# Patient Record
Sex: Female | Born: 1935 | Race: White | Hispanic: No | Marital: Single
Health system: Southern US, Community
[De-identification: ages and names within clinical notes are randomized; demographics above are authoritative.]

## PROBLEM LIST (undated history)

## (undated) DIAGNOSIS — E7800 Pure hypercholesterolemia, unspecified: Secondary | ICD-10-CM

## (undated) DIAGNOSIS — K219 Gastro-esophageal reflux disease without esophagitis: Secondary | ICD-10-CM

## (undated) DIAGNOSIS — N182 Chronic kidney disease, stage 2 (mild): Secondary | ICD-10-CM

## (undated) DIAGNOSIS — M810 Age-related osteoporosis without current pathological fracture: Secondary | ICD-10-CM

## (undated) DIAGNOSIS — I340 Nonrheumatic mitral (valve) insufficiency: Secondary | ICD-10-CM

## (undated) DIAGNOSIS — I4891 Unspecified atrial fibrillation: Secondary | ICD-10-CM

## (undated) DIAGNOSIS — M1990 Unspecified osteoarthritis, unspecified site: Secondary | ICD-10-CM

## (undated) HISTORY — DX: Pure hypercholesterolemia, unspecified: E78.00

## (undated) HISTORY — DX: Unspecified osteoarthritis, unspecified site: M19.90

## (undated) HISTORY — DX: Chronic kidney disease, stage 2 (mild): N18.2

## (undated) HISTORY — DX: Age-related osteoporosis without current pathological fracture: M81.0

## (undated) HISTORY — PX: BACK SURGERY: SHX140

## (undated) HISTORY — DX: Gastro-esophageal reflux disease without esophagitis: K21.9

## (undated) HISTORY — DX: Nonrheumatic mitral (valve) insufficiency: I34.0

## (undated) HISTORY — PX: JOINT REPLACEMENT: SHX530

## (undated) HISTORY — DX: Unspecified atrial fibrillation: I48.91

---

## 2000-04-16 ENCOUNTER — Encounter: Payer: Self-pay | Admitting: Geriatric Medicine

## 2000-04-16 ENCOUNTER — Encounter: Admission: RE | Admit: 2000-04-16 | Discharge: 2000-04-16 | Payer: Self-pay | Admitting: Geriatric Medicine

## 2001-06-22 ENCOUNTER — Ambulatory Visit (HOSPITAL_COMMUNITY): Admission: RE | Admit: 2001-06-22 | Discharge: 2001-06-22 | Payer: Self-pay | Admitting: Geriatric Medicine

## 2001-06-22 ENCOUNTER — Encounter: Payer: Self-pay | Admitting: Geriatric Medicine

## 2002-07-16 ENCOUNTER — Encounter: Payer: Self-pay | Admitting: Geriatric Medicine

## 2002-07-16 ENCOUNTER — Ambulatory Visit (HOSPITAL_COMMUNITY): Admission: RE | Admit: 2002-07-16 | Discharge: 2002-07-16 | Payer: Self-pay | Admitting: Geriatric Medicine

## 2003-08-19 ENCOUNTER — Encounter: Payer: Self-pay | Admitting: Geriatric Medicine

## 2003-08-19 ENCOUNTER — Ambulatory Visit (HOSPITAL_COMMUNITY): Admission: RE | Admit: 2003-08-19 | Discharge: 2003-08-19 | Payer: Self-pay | Admitting: Geriatric Medicine

## 2004-01-11 ENCOUNTER — Encounter: Admission: RE | Admit: 2004-01-11 | Discharge: 2004-01-11 | Payer: Self-pay | Admitting: Orthopedic Surgery

## 2004-01-12 ENCOUNTER — Ambulatory Visit (HOSPITAL_COMMUNITY): Admission: RE | Admit: 2004-01-12 | Discharge: 2004-01-12 | Payer: Self-pay | Admitting: Orthopedic Surgery

## 2004-01-12 ENCOUNTER — Ambulatory Visit (HOSPITAL_BASED_OUTPATIENT_CLINIC_OR_DEPARTMENT_OTHER): Admission: RE | Admit: 2004-01-12 | Discharge: 2004-01-12 | Payer: Self-pay | Admitting: Orthopedic Surgery

## 2004-02-21 ENCOUNTER — Encounter: Admission: RE | Admit: 2004-02-21 | Discharge: 2004-02-21 | Payer: Self-pay | Admitting: Orthopedic Surgery

## 2004-09-07 ENCOUNTER — Ambulatory Visit (HOSPITAL_COMMUNITY): Admission: RE | Admit: 2004-09-07 | Discharge: 2004-09-07 | Payer: Self-pay | Admitting: Geriatric Medicine

## 2005-10-10 ENCOUNTER — Ambulatory Visit (HOSPITAL_COMMUNITY): Admission: RE | Admit: 2005-10-10 | Discharge: 2005-10-10 | Payer: Self-pay | Admitting: Geriatric Medicine

## 2006-01-06 ENCOUNTER — Encounter: Admission: RE | Admit: 2006-01-06 | Discharge: 2006-01-06 | Payer: Self-pay | Admitting: Geriatric Medicine

## 2006-02-17 ENCOUNTER — Encounter: Admission: RE | Admit: 2006-02-17 | Discharge: 2006-02-17 | Payer: Self-pay | Admitting: Orthopedic Surgery

## 2006-03-27 ENCOUNTER — Inpatient Hospital Stay (HOSPITAL_COMMUNITY): Admission: RE | Admit: 2006-03-27 | Discharge: 2006-03-31 | Payer: Self-pay | Admitting: Neurosurgery

## 2006-03-28 ENCOUNTER — Ambulatory Visit: Payer: Self-pay | Admitting: Physical Medicine & Rehabilitation

## 2006-09-10 ENCOUNTER — Inpatient Hospital Stay (HOSPITAL_COMMUNITY): Admission: RE | Admit: 2006-09-10 | Discharge: 2006-09-15 | Payer: Self-pay | Admitting: Orthopedic Surgery

## 2006-09-11 ENCOUNTER — Ambulatory Visit: Payer: Self-pay | Admitting: Physical Medicine & Rehabilitation

## 2007-10-28 ENCOUNTER — Ambulatory Visit (HOSPITAL_COMMUNITY): Admission: RE | Admit: 2007-10-28 | Discharge: 2007-10-28 | Payer: Self-pay | Admitting: Geriatric Medicine

## 2008-11-16 ENCOUNTER — Ambulatory Visit (HOSPITAL_COMMUNITY): Admission: RE | Admit: 2008-11-16 | Discharge: 2008-11-16 | Payer: Self-pay | Admitting: Geriatric Medicine

## 2009-11-22 ENCOUNTER — Ambulatory Visit (HOSPITAL_COMMUNITY): Admission: RE | Admit: 2009-11-22 | Discharge: 2009-11-22 | Payer: Self-pay | Admitting: Urology

## 2010-02-13 ENCOUNTER — Ambulatory Visit (HOSPITAL_BASED_OUTPATIENT_CLINIC_OR_DEPARTMENT_OTHER): Admission: RE | Admit: 2010-02-13 | Discharge: 2010-02-13 | Payer: Self-pay | Admitting: Orthopedic Surgery

## 2010-11-23 ENCOUNTER — Ambulatory Visit (HOSPITAL_COMMUNITY)
Admission: RE | Admit: 2010-11-23 | Discharge: 2010-11-23 | Payer: Self-pay | Source: Home / Self Care | Attending: Geriatric Medicine | Admitting: Geriatric Medicine

## 2011-01-05 ENCOUNTER — Encounter: Payer: Self-pay | Admitting: Orthopedic Surgery

## 2011-03-10 LAB — POCT HEMOGLOBIN-HEMACUE: Hemoglobin: 12.4 g/dL (ref 12.0–15.0)

## 2011-05-03 NOTE — H&P (Signed)
Jessica Berg, Jessica Berg             ACCOUNT NO.:  [id number]   MEDICAL RECORD NO.:  [id number]          PATIENT TYPE:  INP   LOCATION:  1617                         FACILITY:  [facility]   PHYSICIAN:  Ollen Gross, M.D.    DATE OF BIRTH:  12-28-1923   DATE OF ADMISSION:  09/10/2006  DATE OF DISCHARGE:  09/15/2006                                HISTORY & PHYSICAL   CHIEF COMPLAINT:  Right knee pain.   HISTORY OF PRESENT ILLNESS:  Patient is an 77 year old female who was seen  by Dr. Lequita Halt for ongoing right knee pain.  She has been worked up in the  past for back and right leg pain.  She is known to have L4-5 disc herniation  with some degenerative disc disease, spondylolisthesis but she has also been  seen and evaluated by Dr. Lequita Halt and found to have significant arthritis in  and about the right knee.  The knee has been progressive in nature and felt  that a significant amount of pain has been coming from the joint.  It is  felt she has reached a point where she could benefit from undergoing knee  replacement.  Risks and benefits have been discussed and she elects to  proceed with surgery.   ALLERGIES:  HYDROCODONE, ULTRACET, DARVOCET, PERCOCET, MEPROZINE.   CURRENT MEDICATIONS:  Celebrex, vitamin C and calcium.   PAST MEDICAL HISTORY:  1. History of pernicious anemia.  2. History of phlebitis following knee scope.  3. Osteoporosis.  4. Osteoarthritis.  5. Lumbar degenerative disc disease with L4-5 spondylolisthesis.   PAST SURGICAL HISTORY:  1. Appendectomy.  2. Hysterectomy.  3. Back surgery.  4. Right knee arthroscopy.  5. Cataract surgery.  6. Eye lid procedure.   SOCIAL HISTORY:  Widow, retired, nonsmoker, no alcohol, 6 children.   FAMILY HISTORY:  Both parents are deceased.  Both parents had arthritis.  Daughter and son both with diabetes.  Father with a history of myocardial  infarction. Mother with history of hypothyroidism and heart disease.   REVIEW OF  SYSTEMS:  GENERAL:  No fevers, chills or night sweats.  NEUROLOGIC:  No seizures, syncope or paralysis.  RESPIRATORY:  No shortness  of breath, productive cough or hemoptysis.  CARDIOVASCULAR:  No chest pain,  angina or orthopnea.  GI:  No nausea, vomiting, diarrhea or constipation.  GU:  No dysuria, hematuria or discharge.  MUSCULOSKELETAL:  Right knee.   PHYSICAL EXAMINATION:  GENERAL APPEARANCE:  An 78 year old white female,  well-developed, well-nourished, in no acute distress.  She is alert,  oriented, cooperative and pleasant accompanied by her sister.  Excellent  historian.  VITAL SIGNS:  Pulse 72, respirations 12, blood pressure 140/64.  HEENT:  Normocephalic and atraumatic.  Pupils are equal, round and reactive  to light.  Oropharynx clear.  EOM intact.  She does have partial upper and  lower dentures.  NECK:  Supple.  No carotid bruits.  CHEST:  Clear anterior and posterior, chest wall normal.  No wheezing,  rhonchi or rales.  CARDIOVASCULAR:  Regular rate and rhythm with no murmur.  S1 and S2 noted.  ABDOMEN:  Soft, nontender.  Bowel sounds present.  RECTAL/BREASTS/GENITALIA:  Not done and not pertinent to present illness.  EXTREMITIES:  Right knee motor function is intact throughout the right lower  extremity.  Sensation is intact.  No instability.  Joint line tenderness.   IMPRESSION:  Osteoarthritis right knee.   PLAN:  Patient admitted to [facility] [place] to undergo right total knee  arthroplasty.  Procedure will be performed by Dr. Ollen Gross.      Alexzandrew L. Julien Girt, P.A.      Ollen Gross, M.D.  Electronically Signed    ALP/MEDQ  D:  09/14/2006  T:  09/15/2006  Job:  [id number]   cc:   Hal T. Stoneking, M.D.  Fax: [phone]

## 2011-05-03 NOTE — Op Note (Signed)
NAME:  Jessica Berg, Jessica Berg                       ACCOUNT NO.:  [id number]   MEDICAL RECORD NO.:  [id number]                   PATIENT TYPE:  AMB   LOCATION:  DSC                                  FACILITY:  [facility]   PHYSICIAN:  Loreta Ave, M.D.              DATE OF BIRTH:  [birth year]   DATE OF PROCEDURE:  01/12/2004  DATE OF DISCHARGE:                                 OPERATIVE REPORT   PREOPERATIVE DIAGNOSIS:  Chondrocalcinosis and medial meniscus tear, right  knee.   POSTOPERATIVE DIAGNOSIS:  Chondrocalcinosis and medial and lateral meniscus  tears as well as some mild diffuse grade 3 chondromalacia, all three  compartments, most marked at the patellofemoral joint, right knee.   PROCEDURE:  Right knee exam under anesthesia, arthroscopy, chondroplasty of  the patellofemoral joint as well as medial and lateral compartments,  extensive partial medial and lesser extensive partial lateral meniscectomy.   SURGEON:  Loreta Ave, M.D.   ASSISTANT:  Arlys John D. Petrarca, P.A.-C.   ANESTHESIA:  Knee block with sedation.   SPECIMENS:  None.   CULTURES:  None.   COMPLICATIONS:  None.   DRESSINGS:  Soft compressive.   PROCEDURE:  The patient was brought to the operating room and placed on the  operating table in supine position.  After adequate anesthesia had been  obtained, the right knee was examined.  Full motion, good stability.  Markedly positive McMurray's medially.  Tourniquet and leg holder applied.  The leg was prepped and draped in the usual sterile fashion.  Three portals  were created, one superolateral and one medial and lateral parapatellar.  Inflow catheter introduced, arthroscope introduced, knee inspected.  Grade 2  and 3 changes of all three compartments.  Chondroplasty throughout down to a  stable surface.  Remainder articular cartilage throughout.  Most marked  patellofemoral joint.  Marked chondrocalcinosis within the meniscus.  Lateral meniscus had frayed  tearing centrally all the way around, this was  debrided to a stable surface leaving a reasonable amount of meniscus at  completion.  Medial compartment had marked displaced complex tearing  posterior half medial meniscus.  The entire posterior half was removed.  Nice stable surface at completion.  All areas examined, all loose fragments  were removed.  All uneven cartilage surfaces debrided.  The instruments and  fluid were removed.  The portals of the knee were injected with Marcaine.  The portals were closed with 4-0 nylon.  Sterile compressive dressing  applied.  Anesthesia reversed.  Brought to the recovery room.  Tolerated the  surgery well without complications.                                               Loreta Ave, M.D.   DFM/MEDQ  D:  01/12/2004  T:  01/12/2004  Job:  [id number]

## 2011-05-03 NOTE — H&P (Signed)
NAMEKIANDRIA, CLUM             ACCOUNT NO.:  [id number]   MEDICAL RECORD NO.:  [id number]          PATIENT TYPE:  INP   LOCATION:  3172                         FACILITY:  [facility]   PHYSICIAN:  Hewitt Shorts, M.D.DATE OF BIRTH:  12/11/1924   DATE OF ADMISSION:  03/27/2006  DATE OF DISCHARGE:                                HISTORY & PHYSICAL   HISTORY OF PRESENT ILLNESS:  Patient is a 77 year old right-handed white  female who is seen in neurosurgery consultation regarding an acute right L4-  5 lumbar disk herniation.  Patient has had a number of difficulties with her  right knee having undergone a number of surgeries.  Is considered for right  knee replacement.  However, she fell going into [employer] on February 12, 2006.  She does not feel that the fall was related to her knee problems but since  then she has had low back pain radiating to the right buttock and posterior  thigh that has been incapacitating.  She describes numbness and tingling in  the right foot since the fall of 2006.  She does not describe any specific  weakness but her right lower extremity can giveaway due to pain.   The patient is seen in neurosurgery consultation.  MRI scan revealed a  degenerative spondylolisthesis at L4 and 5 with a large right L4-5 lumbar  disk herniation with fragment that had migrated rostrally behind the body of  L4.  X-rays showed a dynamic degenerative spondylolisthesis L4 and 5 and a  decision made to proceed with diskectomy and arthrodesis.   PAST MEDICAL HISTORY:  She does not describe any history of hypertension,  myocardial infarction, cancer, stroke, diabetes, peptic ulcer disease, or  lung disease.   PAST SURGICAL HISTORY:  1.  Appendectomy 1942.  2.  Hysterectomy 1970.  3.  Cataract surgery 1973.  4.  Right knee arthroscopy 2005.   She reports allergy to LODINE, but also describes that DEMEROL and VICODIN  can cause nausea and vomiting.   CURRENT MEDICATIONS:   Celebrex 200 mg daily.   FAMILY HISTORY:  Her mother died age 46.  She had hypothyroidism and heart  disease.  Father died at age 60.  He had myocardial infarction.   SOCIAL HISTORY:  Patient is widowed.  She is retired.  She has a number of  children, two daughters locally.  Both have medical problems.  Patient does  not smoke, drink alcoholic beverage, or have a history of substance abuse.   REVIEW OF SYSTEMS:  Notable for those described in the history of present  illness, past medical history.  14-point review of systems sheet is  otherwise unremarkable.   PHYSICAL EXAMINATION:  GENERAL:  Patient is a well-developed, well-nourished  white female in no acute distress.  VITAL SIGNS:  Temperature 97, pulse 69, blood pressure 141/76, respiratory  rate 16, height 5 feet 1 inches, weight 154 pounds.  LUNGS:  Clear to auscultation.  She has symmetrical respiratory excursion.  HEART:  Regular rate and rhythm without S1, S2.  There is no murmur.  ABDOMEN:  Soft, nondistended.  Bowel sounds are present.  EXTREMITIES:  Shows no clubbing, cyanosis, edema.  MUSCULOSKELETAL:  Shows a negative straight leg raising bilaterally.  There  is mild discomfort to palpation diffusely in the lumbar region, particularly  to the right side.  She is able to flex to 90 degrees without pain; however,  she has pain with any amount of extension.  NEUROLOGIC:  5/5 strength to the upper and lower extremities including the  deltoid, biceps, triceps, intrinsics, grip, iliopsoas, quadriceps,  dorsiflexor, extensor hallux longus, and plantar flexor bilaterally.  She  was hesitant to use full effort with the proximal right lower extremity due  to pain.  Sensation is intact to pin prick to the upper and lower  extremities.  Reflexes are 1 in the biceps, brachioradialis, triceps,  quadriceps, and gastrocnemii.  They are symmetrical bilaterally.  Toes are  downgoing bilaterally.  Gait and stance both favor right lower  extremity.   IMPRESSION:  Acute right lumbar radiculopathy secondary to a large right L4-  5 lumbar disk herniation with a fragment that has migrated rostrally behind  the body of L4.  She has dynamic degenerative spondylolisthesis L4 and 5  with underlying spondylosis and degenerative disk disease.   PLAN:  Patient will be admitted for bilateral L4-5 lumbar decompression,  diskectomy, posterior lumbar interbody fusion with interbody implants and  bone graft and posterolateral arthrodesis with posterior instrumentation and  bone graft.  We have discussed the nature of her condition, alternatives to  surgery, the nature of the surgical procedure itself, typical length of  surgery, hospital stay and overall recuperation, limitations  postoperatively, need for postoperative mobilization in a lumbar corset, and  risks are to include risk of infection, bleeding, possible need for  transfusion, the risk of nerve root dysfunction, pain, weakness, numbness,  or paraesthesias, the risk of dural tear and CSF leakage and possible need  for further surgery, the risk of failure of the arthrodesis and anesthetic  risks of myocardial infarction, stroke, pneumonia, and death.  It is  explained that we would use a cell saver during surgery.  Understanding all  of this, patient would like to go ahead with surgery and is admitted for  such.      Hewitt Shorts, M.D.  Electronically Signed     RWN/MEDQ  D:  03/27/2006  T:  03/27/2006  Job:  [id number]

## 2011-05-03 NOTE — Op Note (Signed)
Jessica Berg, Jessica Berg             ACCOUNT NO.:  [id number]   MEDICAL RECORD NO.:  [id number]          PATIENT TYPE:  INP   LOCATION:  3011                         FACILITY:  [facility]   PHYSICIAN:  Hewitt Shorts, M.D.DATE OF BIRTH:  1924-01-14   DATE OF PROCEDURE:  03/27/2006  DATE OF DISCHARGE:                                 OPERATIVE REPORT   PREOPERATIVE DIAGNOSES:  1.  Right L4-5 disk herniation.  2.  L4-5 dynamic degenerative spondylolisthesis, grade 1-2.  3.  Lumbar degenerative disease and lumbar radiculopathy.   POSTOPERATIVE DIAGNOSES:  1.  Right L4-5 disk herniation.  2.  L4-5 dynamic degenerative spondylolisthesis, grade 1-2.  3.  Lumbar degenerative disease and lumbar radiculopathy.   PROCEDURES:  1.  Bilateral L4-5 lumbar laminotomy, facetectomy and microdiskectomy.  2.  Posterior lumbar interbody fusion, with AVS Peak interbody implants and      VTOSS with bone marrow aspirate.  3.  Posterolateral arthrodesis with 90-deep posterior instrumentation, VTOSS      with bone marrow aspirate and InterFuse.   SURGEON:  Hewitt Shorts, M.D.   ASSISTANT:  Cristi Loron, M.D.   ANESTHESIA:  General endotracheal.   INDICATIONS:  The patient is a 77 year old woman who presented with acute  right lumbar radiculopathy and a large right L4-5 lumbar disk herniation,  that had migrated rostrally behind the body of L4.  Workup revealed dynamic  degenerate spondylolisthesis at L4 and L5.  She is now ready to proceed with  decompression and arthrodesis.   PROCEDURE:  The patient was brought to the operating room, placed under  general endotracheal anesthesia.  The patient was turned to a prone position  and the lumbar region was prepped Betadine soapy solution, and then draped  in a sterile fashion.  The midline was infiltrated with local anesthetic  with epinephrine, and then a midline incision was made after an x-ray was  taken for localization.  Dissection was  carried down through the  subcutaneous tissue.  Bipolar and electrocautery were used to maintain  hemostasis.  Dissection was carried down through to the lumbar fascia, which  was incised bilaterally and the paraspinal muscles were dissected in the  spinous processes of the lamina in a subperiosteal fashion.  Additional x-  rays were taken for localization, and the L4-5 interlaminar space was  identified. Dissection was then carried out laterally over the hypertrophic  facet complexes, and we exposed the transverse processes at L4 and L5  bilaterally.  We then removed the hypertrophic facet overgrowth with double-  action rongeur, and then laminectomy and facetectomy was performed using the  Extra Max drill and Kerrison punches.  The microscope was draped and brought  into the field to provide additional magnification and illumination for  visualization.  The remainder of the decompression was performed using  microdissection and microsurgical technique.  The ligament of Flavum was  thickened bilaterally, and was carefully removed -- exposing the thecal sac.  We exposed the L4 and L5 nerve roots bilaterally.  We then jointly retracted  the thecal sac on the right side medially, and incised the annulus  and the  posterior and longitudinal ligaments.  Several fragments of disk herniation  were immediately removed, and then the disk space was entered and we  proceeded with diskectomy using a variety of microcurets and pituitary  rongeurs.  We then turned our attention to the ventral epidural space,  rostral to the L4-5 disk level, ventral to the thecal sac and to the right  L4 nerve root.  We were able to mobilize several large fragments of disk  herniation in the epidural space, and we were able to decompress the thecal  sac and right L4 nerve root.   We then returned to the disk space and proceeded with preparation,  continuing the diskectomy and removing posterior spondylytic overgrowth.   Then using paddle curets, we removed the cartilaginous endplates.  We then  exposed the annulus on the left side, incised the annulus, entered the disk  space and continued the diskectomy using microcurets and pituitary rongeurs.  The spondylitic overgrowth in the posterior aspect of the vertebral bodies  was removed as well; and, the endplates were then prepared with paddle  curets.  We selected 9 mm in height, 25 mm in depth Peak anterior interbody  implants, with 0 degree of lordosis.  We then brought in the C-arm  fluoroscope and proceeded with probing the right L4 pedicle.  With C-arm  fluoroscopic guidance we aspirated bone marrow aspirate that was injected  over a 10 cc strip of VTOSS. We then packed the interbody implants with  VTOSS with bone marrow aspirate.  Carefully retracting the thecal sac and  nerve root, a spacer was placed in the left side.  We then retracted the  thecal sac and nerve root on the right side; placed the interbody implant  and it was countersunk. We then removed the spacer from the left side and  retracted the thecal sac and nerve roots medially; packing the space between  the implants with VTOSS with bone marrow aspirate and then placing the left-  sided implant.  It was again countersunk.   We then went ahead with C-arm fluoroscopic guidance and probed the left L4  pedicle as well as the L5 pedicles bilaterally.  Each of them were examined  with a ball probe.  No cutouts were found.  Each was tapped with a 5.25 mm  tap, and we placed 5.75 x 40 mm screws bilaterally at L4 and L5.   We then cut a 60 mm rod in half.  It was plated to the screw heads and  locking caps were placed, and then tightened against counter torque.  The  spinous processes were decorticated and then we packed InFuse VTOSS and  additional InFuse in the lateral gutters over the L4 and L5 transverse  processes, and then into the intertransverse space.  The wound was irrigated numerous  times through the procedure with Bacitracin  solution.  Then, once the arthrodesis was completed, we proceeded with  closure.  The deep fascia was closed with interrupted undyed 1-0 Vicryl  suture.  The Scarpa's fascia was closed with interrupted undyed 1-0 Vicryl  sutures.  The subcutaneous with subcuticular interrupted 2-0 Vicryl sutures.  The skin was closed with surgical staples.  The wound was dressed with  Adaptic and sterile gauze.   The procedure was tolerated well.  Following the procedure the patient was  to be turned back to the supine position; to be reversed from the anesthetic  and extubated, then transferred to the recovery room for further  care.   ESTIMATED BLOOD LOSS:  450 cc.  The patient was given about 200 cc of self-  saver blood.   COUNTS:  Sponge and needle count were correct.     Hewitt Shorts, M.D.  Electronically Signed    RWN/MEDQ  D:  03/27/2006  T:  03/27/2006  Job:  [id number]

## 2011-05-03 NOTE — Discharge Summary (Signed)
Jessica Berg, FRIEDLY             ACCOUNT NO.:  [id number]   MEDICAL RECORD NO.:  [id number]          PATIENT TYPE:  INP   LOCATION:  3011                         FACILITY:  [facility]   PHYSICIAN:  Hewitt Shorts, M.D.DATE OF BIRTH:  1924-05-18   DATE OF ADMISSION:  03/27/2006  DATE OF DISCHARGE:  03/31/2006                           DISCHARGE SUMMARY - REFERRING   ADMISSION HISTORY AND PHYSICAL:  Patient is a 77 year old woman who was seen  regarding an acute right L4-5 lumbar disc herniation.  She was found to have  a degenerative grade I to II spondylolisthesis at L4 and 5 with a large  right L4-5 lumbar disc herniation with a fragment that had migrated  rostrally behind the body of L4.  Patient was admitted for discectomy and  arthrodesis.   Her history is also notable for having undergone a number of right knee  surgery, Dr. Lequita Halt feels that she is going to need a total right knee  replacement, but because of this severe acute radiculopathy due to acute  disc herniation (that was related to a fall at [facility] on February 12, 2006),  she is now admitted for lumbar decompression and arthrodesis.   PAST MEDICAL HISTORY:  She did not describe any history of hypertension,  myocardial infarction, cancer, stroke, diabetes, peptic ulcer disease, or  lung disease.   PAST SURGICAL HISTORY:  1.  Appendectomy.  2.  Hysterectomy.  3.  Cataract surgery.  4.  Right knee arthroscopy.   OUTPATIENT MEDICATION:  Celebrex 10 mg daily.   PHYSICAL EXAMINATION:  Unremarkable general examination.  Neurologic  examination showed 5/5 strength.  She had a negative straight leg raising.   HOSPITAL COURSE:  Patient was admitted, underwent a bilateral L4-5 lumbar  laminotomy, facetectomy and microdiscectomy, posterior lumbar interbody  fusion with interbody implants and bone graft and a L4-5 posterolateral  arthrodesis with 90D posterior instrumentation and bone graft.   Following surgery, the  patient has done well.  She was seen in consultation  by physical therapy and occupational therapy.  She has made gradual progress  in terms of transfers and mobility.  She is ambulating with assistance with  a rolling walker.  However, because of her ongoing knee difficulties, she  has some limitations in ambulation.  Physical medicine rehabilitation  consultation was obtained, however, after the patient spoke with the rehab  doctor, she requested placement in a skilled nursing facility.  Patient has  been afebrile.  Postoperative chemistries and blood counts have been stable.  Patient's wound has been healing well and staples removed on the day of  transfer and the wound was Steri-Stripped.  She should leave it open to air,  however, when she showers only for the first three or four days, she should  have it covered.  After the first three or four days, she can shower without  covering the wound and the Steri-Strips are washed off.   We expect the patient to continue with her rehabilitation at the [facility] where arrangements have been made for transfer.  The patient  will be followed by her primary physician  Hal T. Stoneking, M.D., who covers  at the [facility] and will be able to attend to medical issues  if they should arise.  Patient should return to my office for follow-up in  three to four weeks and we will perform AP and lateral lumbar spine x-rays  at my office on the day of her visit to assess her fusion.   As far as medications are concerned, she has been using Percocet p.r.n. for  pain, but otherwise has not required much in the way of medications.   DISCHARGE DIAGNOSES:  1.  Right L4-5 lumbar disc herniation.  2.  L4-5 dynamic degenerative spondylolisthesis grade I to II.  3.  Lumbar degenerative disc disease.  4.  Lumbar spondylolisthesis.  5.  Lumbar radiculopathy.  6.  Advanced arthritic degenerative changes of the right knee.      Hewitt Shorts, M.D.  Electronically Signed     RWN/MEDQ  D:  03/31/2006  T:  03/31/2006  Job:  [id number]

## 2011-05-03 NOTE — Op Note (Signed)
NAMECYNDIE, WOODBECK             ACCOUNT NO.:  [id number]   MEDICAL RECORD NO.:  [id number]          PATIENT TYPE:  INP   LOCATION:  1617                         FACILITY:  [facility]   PHYSICIAN:  Ollen Gross, M.D.    DATE OF BIRTH:  September 19, 1924   DATE OF PROCEDURE:  09/10/2006  DATE OF DISCHARGE:                                 OPERATIVE REPORT   PREOPERATIVE DIAGNOSIS:  Osteoarthritis right knee.   POSTOPERATIVE DIAGNOSIS:  Osteoarthritis right knee.   PROCEDURE:  Right total knee arthroplasty.   SURGEON:  Ollen Gross, M.D.   ASSISTANT:  Alexzandrew L. Julien Girt, P.A.   ANESTHESIA:  General with postop Marcaine pain pump.   ESTIMATED BLOOD LOSS:  Minimal   DRAIN:  Hemovac x1.   TOURNIQUET TIME:  46 minutes at 300 mmHg.   COMPLICATIONS:  None.   CONDITION:  Stable to recovery.   BRIEF CLINICAL NOTE:  Ms. Denzler is an 77 year old female who has end-stage  arthritis of the right knee with intractable pain.  She presents for total  knee arthroplasty.   PROCEDURE IN DETAIL:  After successful administration of general anesthetic,  tourniquet is placed on the right thigh and right lower extremity prepped  and draped in usual sterile fashion.  Extremity is wrapped in Esmarch, knee  flexed, tourniquet inflated to 300 mmHg.  Midline incision made with 10  blade through subcutaneous tissue to the level of the extensor mechanism.  Fresh blade is used to make a medial parapatellar arthrotomy.  Soft tissue  of the proximal medial tibia is subperiosteally elevated to the joint line  with the knife into the semimembranosus bursa with a Cobb elevator.  Soft  tissue laterally is elevated with attention being paid to avoid the patellar  tendon on tibial tubercle.  Patella subluxed laterally, knee flexed 90  degrees, ACL and PCL removed.  Drill was used to create a starting hole and  the distal femur canal is thoroughly irrigated.  A 5 degree right valgus  alignment guide is placed and  referencing off the posterior condyles,  rotations marked and the block pinned to remove 10 mm of the distal femur.  Distal femoral resection is made with an oscillating saw.  Sizing blocks  placed, size 3 is most appropriate.  Rotations marked off the epicondylar  axis and a size 3 cutting block placed.  The anterior, posterior and chamfer  cuts are made.   Tibia is subluxed forward and the menisci are removed.  Extramedullary  tibial alignment guide is placed referencing proximally at the medial aspect  of the tibial tubercle and distally along the second metatarsal axis and  tibial crest.  Blocks pinned to remove 10 mm of the nondeficient lateral  side.  Tibial resection is made with an oscillating saw.  Size 3 is the most  appropriate tibial component and the proximal tibia prepared the modular  drill and keel punch for size 3.  Femoral preparation is completed with the  intercondylar cut.   Size 3 mobile bearing tibial trial, size 3 posterior stabilized femoral  trial and 10 mm posterior stabilized rotating platform  insert trial are  placed.  With the 10, full extension is achieved with excellent varus and  valgus balance throughout full range of motion.  The patella is then everted  and thickness measured to be 22 mm.  Freehand resection taken to 13 mm, 35  template is placed, lug holes are drilled, trial patella is placed and it  tracks normally.  The osteophytes removed off the posterior femur with the  trial in place.  All trials are removed and cut bone surfaces prepared with  pulsatile lavage.  Cement is mixed and once ready for implantation, a size 3  mobile bearing tibial, tray size 3 posterior stabilized femur and 35 patella  are cemented into place and patella is held with the clamp.  Trial 10 mm  insert is placed and knee held in full extension and all extruded cement  removed.  Once cement is fully hardened, then the permanent 10 mm posterior  stabilized rotating  platform insert is placed into the tibial tray.  Wound  is copiously irrigated with saline solution and the extensor mechanism  closed over Hemovac drain with interrupted #1 PDS.  Flexion against gravity  is 135 degrees.  Tourniquet is released with total time of 46 minutes.  Subcu is closed with interrupted 2-0 Vicryl, subcuticular running 4-0  Monocryl.  The catheter for the Marcaine pain pump is placed and the pump is  initiated.  Drain is hooked to suction.  Steri-Strips and bulky sterile  dressing are applied and then she is placed into a knee immobilizer,  awakened and transported to recovery in stable condition.      Ollen Gross, M.D.  Electronically Signed     FA/MEDQ  D:  09/10/2006  T:  09/12/2006  Job:  [id number]

## 2011-05-03 NOTE — Discharge Summary (Signed)
Jessica Berg, Jessica Berg             ACCOUNT NO.:  [id number]   MEDICAL RECORD NO.:  [id number]          PATIENT TYPE:  INP   LOCATION:  1617                         FACILITY:  [facility]   PHYSICIAN:  Ollen Gross, M.D.    DATE OF BIRTH:  Aug 09, 1924   DATE OF ADMISSION:  09/10/2006  DATE OF DISCHARGE:  09/15/2006                                 DISCHARGE SUMMARY   ADMITTING DIAGNOSES:  1. Osteoarthritis, right knee.  2. Pernicious anemia.  3. Osteoporosis.  4. Osteoarthritis.  5. History of phlebitis after right knee scope.  6. Lumbar degenerative disc disease.  7. L4-L5 spondylolisthesis and spinal stenosis.   DISCHARGE DIAGNOSES:  1. Osteoarthritis, right knee, status post right total knee arthroplasty.  2. Postoperative acute blood loss anemia.  3. Pernicious anemia.  4. Osteoporosis.  5. Osteoarthritis.  6. History of phlebitis after right knee scope.  7. Lumbar degenerative disc disease.  8. L4-L5 spondylolisthesis and spinal stenosis.   PROCEDURE:  On September 10, 2006, right total knee, Dr. Lequita Halt.  Assistant  Julien Girt, PA-C.   CONSULTATIONS:  Rehab services.   BRIEF HISTORY:  Ms. Courtney is an 77 year old female with end-stage  osteoarthritis of the right knee with intractable pain, now presents for  total knee arthroplasty.   LABORATORY DATA:  Preoperative CBC hemoglobin 13.6, hematocrit of 39.4,  white cell count 6.9.  Postop hemoglobin 10.2, drifted down to 9.2, starting  coming back up and was 9.4 with a hematocrit of 26.9 prior to discharge.  PT, PTT on admission 13.8 and 29, respectively, INR 1.  Serial pro times  followed and last PT and INR 16 and 1.2.  Chem panel on admission all within  normal limits.  Serial BMETs were followed.  Glucose 199-198, back down to  122.  Remaining electrolytes remained within normal limits.  Preop UA mild  leukocyte esterase with negative nitrite, 0-2 white cells, otherwise  negative.  Blood group type B+.   Two view chest  March 25, 2006 no active disease.   EKG March 24, 2006 sinus rhythm with marked sinus arrhythmia, otherwise  normal per Dr. Lenise Herald.   HOSPITAL COURSE:  The patient was admitted to [facility],  tolerated the procedure well, later transferred to the recovery room and the  orthopedic floor.  Started on PCA and p.o. analgesia for pain control  following surgery.  Given 24 hours of postop IV antibiotics.  Started on  Coumadin for DVT prophylaxis.  Did have fair amount of nausea on the night  of surgery and the morning of day 1.  Used antiemetics.  Rehab consult was  called to see if the patient would need any type of inpatient stay.  The  patient wanted to look into rehab or look into [facility] where she had  previously been seen and treated before.  The patient was seen by rehab  services and felt due to the initial slow progression that she would require  some type of skilled nursing facility.  We asked discharge planning to look  into [facility] as an option.  By day 2, her hemoglobin had dropped  a  little bit but she was asymptomatic with it.  She was placed on iron.  Dressing was changed.  Incision looked good.  She was slowly progressing  with her therapy at that point, only getting up and walking short distances  of about 20-40 feet.  She was continuing to progress.  PCA was discontinued  at that time.  She still had a little bit of nausea so we continued the  fluids a little bit longer.  Over the weekend, she did fairly well,  continued to progress with PT.  Wound was checked and followed closely.  Arrangements were being made and it was felt that she would be ready to go  on Monday, October 1.  She was seen in rounds by Dr. Lequita Halt on October 1,  doing well.  Incision looks good.  She was not quite therapeutic yet on her  INR.  Therefore, Lovenox was added.  At that point, she was discharged to  [facility] for continued care.   DISCHARGE PLAN:  Patient  discharged over to [facility].   DISCHARGE DIAGNOSES:  Please see above.   DISCHARGE MEDICATIONS:  Current medications include:  1. Coumadin protocol, please titrate the Coumadin levels for a target INR      between 2-3.  2. Colace 100 mg p.o. b.i.d.  3. Robaxin 500 mg p.o. q.6-8 h p.r.n. spasm.  4. Benadryl 25 mg p.o. at bedtime p.r.n. sleep.  5. Zofran 4 mg p.o. q.6 h p.r.n. nausea.  6. Dilaudid 2 mg 1-2 tabs every 4-6 hours as needed for pain.  7. Please also add Lovenox 40 subcutaneous injection daily.  Please      continue the Lovenox until her INR is 2.  Once her INR reaches 2, you      may please discontinue the Lovenox.   DIET:  Diet as tolerated.   ACTIVITY:  She is weightbearing as tolerated to the right lower extremity.  Continue gait training, ambulation, ADLs as per PT and OT.  Total knee  protocol.  Daily dressing changes.  She may start showering when she is  transferred.   FOLLOWUP:  She is to follow up with Dr. Lequita Halt in the office 2 weeks from  surgery.   DISPOSITION:  [facility].   CONDITION UPON DISCHARGE:  Improving.      Alexzandrew L. Julien Girt, P.A.      Ollen Gross, M.D.  Electronically Signed    ALP/MEDQ  D:  09/15/2006  T:  09/15/2006  Job:  [id number]   cc:   Hal T. Stoneking, M.D.  Fax: [phone]

## 2011-11-23 ENCOUNTER — Emergency Department (HOSPITAL_COMMUNITY)
Admission: EM | Admit: 2011-11-23 | Discharge: 2011-11-23 | Disposition: A | Discharge status: Home / Self Care | Payer: Medicare Other | Attending: Emergency Medicine | Admitting: Emergency Medicine

## 2011-11-23 ENCOUNTER — Other Ambulatory Visit: Payer: Self-pay

## 2011-11-23 ENCOUNTER — Emergency Department (HOSPITAL_COMMUNITY): Payer: Medicare Other

## 2011-11-23 DIAGNOSIS — R5381 Other malaise: Secondary | ICD-10-CM | POA: Insufficient documentation

## 2011-11-23 DIAGNOSIS — R5383 Other fatigue: Secondary | ICD-10-CM | POA: Insufficient documentation

## 2011-11-23 DIAGNOSIS — N390 Urinary tract infection, site not specified: Secondary | ICD-10-CM | POA: Insufficient documentation

## 2011-11-23 DIAGNOSIS — R531 Weakness: Secondary | ICD-10-CM

## 2011-11-23 DIAGNOSIS — J069 Acute upper respiratory infection, unspecified: Secondary | ICD-10-CM | POA: Insufficient documentation

## 2011-11-23 LAB — TROPONIN I: Troponin I: <0.3 ng/mL (ref <0.30)

## 2011-11-23 LAB — CK TOTAL AND CKMB (NOT AT ARMC)
Relative Index: INVALID (ref 0.0–2.5)
Total CK: 80 U/L (ref 7–177)

## 2011-11-23 LAB — URINALYSIS, ROUTINE W REFLEX MICROSCOPIC
Glucose, UA: NEGATIVE mg/dL
Hgb urine dipstick: NEGATIVE
Ketones, ur: NEGATIVE mg/dL
Protein, ur: NEGATIVE mg/dL
pH: 6.5 (ref 5.0–8.0)

## 2011-11-23 LAB — DIFFERENTIAL
Eosinophils Relative: 2 % (ref 0–5)
Lymphocytes Relative: 29 % (ref 12–46)
Lymphs Abs: 1.9 10*3/uL (ref 0.7–4.0)
Monocytes Absolute: 0.7 10*3/uL (ref 0.1–1.0)
Neutro Abs: 3.8 10*3/uL (ref 1.7–7.7)
Neutrophils Relative %: 58 % (ref 43–77)

## 2011-11-23 LAB — COMPREHENSIVE METABOLIC PANEL
ALT: 19 U/L (ref 0–35)
AST: 23 U/L (ref 0–37)
Alkaline Phosphatase: 78 U/L (ref 39–117)
BUN: 15 mg/dL (ref 6–23)
Calcium: 10.2 mg/dL (ref 8.4–10.5)
Creatinine, Ser: 0.92 mg/dL (ref 0.50–1.10)

## 2011-11-23 LAB — CBC
MCHC: 33.2 g/dL (ref 30.0–36.0)
Platelets: 192 10*3/uL (ref 150–400)
RDW: 11.8 % (ref 11.5–15.5)

## 2011-11-23 LAB — URINE MICROSCOPIC-ADD ON

## 2011-11-23 MED ORDER — SULFAMETHOXAZOLE-TRIMETHOPRIM 800-160 MG PO TABS: 1.0000 | ORAL_TABLET | Freq: Two times a day (BID) | ORAL | Status: AC

## 2011-11-23 NOTE — ED Notes (Signed)
Pt to ED with c/o general weakness and fatigue x 6 days. Pt denies any dizziness or any other symptoms. Pt states has been having a terrible cough as well

## 2011-11-23 NOTE — ED Notes (Signed)
States was seen at the walk-in clinic and told to come here for abnormal ECG

## 2011-11-23 NOTE — ED Provider Notes (Signed)
History     CSN: [id number] Arrival date & time: 11/23/2011  1:26 PM   First MD Initiated Contact with Patient 11/23/11 1336      Chief Complaint  Patient presents with  . Dizziness    (Consider location/radiation/quality/duration/timing/severity/associated sxs/prior treatment) HPI Comments: Patient has been sick with coughing, congestion for the past week.  Now feels weak, no energy, gets dizzy when trying to exert herself.  Patient is a 77 y.o. female presenting with weakness. The history is provided by the patient.  Weakness Primary symptoms do not include headaches, syncope, fever or vomiting. The symptoms began yesterday. The symptoms are unchanged. The neurological symptoms are diffuse.  Additional symptoms include weakness.    History reviewed. No pertinent past medical history.  Past Surgical History  Procedure Date  . Back surgery   . Joint replacement     No family history on file.  History  Substance Use Topics  . Smoking status: Never Smoker   . Smokeless tobacco: Not on file  . Alcohol Use: No    OB History    Grav Para Term Preterm Abortions TAB SAB Ect Mult Living                  Review of Systems  Constitutional: Negative for fever.  Cardiovascular: Negative for syncope.  Gastrointestinal: Negative for vomiting.  Neurological: Positive for weakness. Negative for headaches.  All other systems reviewed and are negative.    Allergies  Darvocet; Demerol; Hydrocodone; Lodine; Meprozine; Penicillins; Percocet; and Ultracet  Home Medications  No current outpatient prescriptions on file.  BP 129/85  Pulse 71  Temp 98.6 F (37 C)  Resp 20  SpO2 99%  Physical Exam  Nursing note and vitals reviewed. Constitutional: She is oriented to person, place, and time. She appears well-developed and well-nourished. No distress.  HENT:  Head: Normocephalic and atraumatic.  Neck: Normal range of motion. Neck supple.  Cardiovascular: Normal rate and  regular rhythm.  Exam reveals no gallop and no friction rub.   No murmur heard. Pulmonary/Chest: Effort normal and breath sounds normal. No respiratory distress. She has no wheezes.  Abdominal: Soft. Bowel sounds are normal. She exhibits no distension. There is no tenderness.  Musculoskeletal: Normal range of motion.  Neurological: She is alert and oriented to person, place, and time.  Skin: Skin is warm and dry. She is not diaphoretic.    ED Course  Procedures (including critical care time)   Labs Reviewed  CBC  DIFFERENTIAL  COMPREHENSIVE METABOLIC PANEL  CK TOTAL AND CKMB  TROPONIN I  URINALYSIS, ROUTINE W REFLEX MICROSCOPIC   No results found.   No diagnosis found.   Date: 11/23/2011  Rate: 92  Rhythm: normal sinus rhythm  QRS Axis: normal  Intervals: normal  ST/T Wave abnormalities: normal  Conduction Disutrbances:none  Narrative Interpretation:   Old EKG Reviewed: none available    MDM  Labs, EKG, cxr all look okay.  Will discharge to home and treat respiratory symptoms and leukocytes in the urine with antibiotics.        Geoffery Lyons, MD 11/23/11 1536

## 2011-11-23 NOTE — ED Notes (Signed)
Pt in from home with dizziness and weakness states onset 1 week ago denies n/v also c/o dry cough

## 2012-03-12 DIAGNOSIS — H04129 Dry eye syndrome of unspecified lacrimal gland: Secondary | ICD-10-CM | POA: Diagnosis not present

## 2012-03-12 DIAGNOSIS — H26499 Other secondary cataract, unspecified eye: Secondary | ICD-10-CM | POA: Diagnosis not present

## 2012-03-12 DIAGNOSIS — Z961 Presence of intraocular lens: Secondary | ICD-10-CM | POA: Diagnosis not present

## 2012-04-23 DIAGNOSIS — Z961 Presence of intraocular lens: Secondary | ICD-10-CM | POA: Diagnosis not present

## 2012-04-23 DIAGNOSIS — H1045 Other chronic allergic conjunctivitis: Secondary | ICD-10-CM | POA: Diagnosis not present

## 2012-04-23 DIAGNOSIS — H16219 Exposure keratoconjunctivitis, unspecified eye: Secondary | ICD-10-CM | POA: Diagnosis not present

## 2012-04-23 DIAGNOSIS — H04129 Dry eye syndrome of unspecified lacrimal gland: Secondary | ICD-10-CM | POA: Diagnosis not present

## 2012-08-24 DIAGNOSIS — M1990 Unspecified osteoarthritis, unspecified site: Secondary | ICD-10-CM | POA: Diagnosis not present

## 2012-08-24 DIAGNOSIS — M810 Age-related osteoporosis without current pathological fracture: Secondary | ICD-10-CM | POA: Diagnosis not present

## 2012-08-24 DIAGNOSIS — Z79899 Other long term (current) drug therapy: Secondary | ICD-10-CM | POA: Diagnosis not present

## 2012-08-24 DIAGNOSIS — Z1331 Encounter for screening for depression: Secondary | ICD-10-CM | POA: Diagnosis not present

## 2012-09-08 DIAGNOSIS — R42 Dizziness and giddiness: Secondary | ICD-10-CM | POA: Diagnosis not present

## 2012-09-10 DIAGNOSIS — R42 Dizziness and giddiness: Secondary | ICD-10-CM | POA: Diagnosis not present

## 2012-09-11 DIAGNOSIS — R42 Dizziness and giddiness: Secondary | ICD-10-CM | POA: Diagnosis not present

## 2012-09-16 DIAGNOSIS — I4892 Unspecified atrial flutter: Secondary | ICD-10-CM | POA: Diagnosis not present

## 2012-10-28 DIAGNOSIS — I4892 Unspecified atrial flutter: Secondary | ICD-10-CM | POA: Diagnosis not present

## 2013-02-04 DIAGNOSIS — R42 Dizziness and giddiness: Secondary | ICD-10-CM | POA: Diagnosis not present

## 2013-02-04 DIAGNOSIS — I4891 Unspecified atrial fibrillation: Secondary | ICD-10-CM | POA: Diagnosis not present

## 2013-02-04 DIAGNOSIS — Z0000 Encounter for general adult medical examination without abnormal findings: Secondary | ICD-10-CM | POA: Diagnosis not present

## 2013-02-04 DIAGNOSIS — Z79899 Other long term (current) drug therapy: Secondary | ICD-10-CM | POA: Diagnosis not present

## 2013-02-04 DIAGNOSIS — Z1331 Encounter for screening for depression: Secondary | ICD-10-CM | POA: Diagnosis not present

## 2013-02-04 DIAGNOSIS — I4892 Unspecified atrial flutter: Secondary | ICD-10-CM | POA: Diagnosis not present

## 2013-02-24 DIAGNOSIS — Z0389 Encounter for observation for other suspected diseases and conditions ruled out: Secondary | ICD-10-CM | POA: Diagnosis not present

## 2013-02-24 DIAGNOSIS — I4891 Unspecified atrial fibrillation: Secondary | ICD-10-CM | POA: Diagnosis not present

## 2013-02-24 DIAGNOSIS — I4892 Unspecified atrial flutter: Secondary | ICD-10-CM | POA: Diagnosis not present

## 2013-02-24 DIAGNOSIS — I471 Supraventricular tachycardia: Secondary | ICD-10-CM | POA: Diagnosis not present

## 2013-02-24 DIAGNOSIS — E782 Mixed hyperlipidemia: Secondary | ICD-10-CM | POA: Diagnosis not present

## 2013-03-11 DIAGNOSIS — I4891 Unspecified atrial fibrillation: Secondary | ICD-10-CM | POA: Diagnosis not present

## 2013-03-11 DIAGNOSIS — R002 Palpitations: Secondary | ICD-10-CM | POA: Diagnosis not present

## 2013-03-11 DIAGNOSIS — I471 Supraventricular tachycardia: Secondary | ICD-10-CM | POA: Diagnosis not present

## 2013-04-01 DIAGNOSIS — R55 Syncope and collapse: Secondary | ICD-10-CM | POA: Diagnosis not present

## 2013-04-01 DIAGNOSIS — I471 Supraventricular tachycardia: Secondary | ICD-10-CM | POA: Diagnosis not present

## 2013-04-16 DIAGNOSIS — Z7901 Long term (current) use of anticoagulants: Secondary | ICD-10-CM | POA: Diagnosis not present

## 2013-04-16 DIAGNOSIS — R259 Unspecified abnormal involuntary movements: Secondary | ICD-10-CM | POA: Diagnosis not present

## 2013-04-16 DIAGNOSIS — I4891 Unspecified atrial fibrillation: Secondary | ICD-10-CM | POA: Diagnosis not present

## 2013-04-30 DIAGNOSIS — Z79899 Other long term (current) drug therapy: Secondary | ICD-10-CM | POA: Diagnosis not present

## 2013-04-30 DIAGNOSIS — I4891 Unspecified atrial fibrillation: Secondary | ICD-10-CM | POA: Diagnosis not present

## 2013-05-12 DIAGNOSIS — I471 Supraventricular tachycardia: Secondary | ICD-10-CM | POA: Diagnosis not present

## 2013-05-12 DIAGNOSIS — Z79899 Other long term (current) drug therapy: Secondary | ICD-10-CM | POA: Diagnosis not present

## 2013-05-12 DIAGNOSIS — I4891 Unspecified atrial fibrillation: Secondary | ICD-10-CM | POA: Diagnosis not present

## 2013-05-12 DIAGNOSIS — Z7901 Long term (current) use of anticoagulants: Secondary | ICD-10-CM | POA: Diagnosis not present

## 2013-09-08 DIAGNOSIS — I4891 Unspecified atrial fibrillation: Secondary | ICD-10-CM | POA: Diagnosis not present

## 2013-09-08 DIAGNOSIS — Z7901 Long term (current) use of anticoagulants: Secondary | ICD-10-CM | POA: Diagnosis not present

## 2013-09-08 DIAGNOSIS — E782 Mixed hyperlipidemia: Secondary | ICD-10-CM | POA: Diagnosis not present

## 2013-10-04 ENCOUNTER — Other Ambulatory Visit: Payer: Self-pay | Admitting: *Deleted

## 2013-10-04 DIAGNOSIS — I4891 Unspecified atrial fibrillation: Secondary | ICD-10-CM

## 2013-10-04 DIAGNOSIS — Z79899 Other long term (current) drug therapy: Secondary | ICD-10-CM

## 2013-10-08 ENCOUNTER — Telehealth: Payer: Self-pay | Admitting: *Deleted

## 2013-10-08 NOTE — Telephone Encounter (Signed)
Patient daughter called requesting a refill on Eliquis 5mg  bid and Metoprolol 25mg  qd to be sent in to [facility] on [address]. She needs a 90 day supply.

## 2013-10-12 MED ORDER — APIXABAN 5 MG PO TABS: 5.0000 mg | ORAL_TABLET | Freq: Two times a day (BID) | ORAL | Status: DC

## 2013-10-12 MED ORDER — METOPROLOL SUCCINATE ER 25 MG PO TB24: 25.0000 mg | ORAL_TABLET | Freq: Every day | ORAL | Status: DC

## 2013-10-12 NOTE — Telephone Encounter (Signed)
Rx filled

## 2013-10-12 NOTE — Telephone Encounter (Signed)
Filled Rx for patient.

## 2013-11-15 ENCOUNTER — Other Ambulatory Visit: Payer: 59

## 2013-12-06 ENCOUNTER — Telehealth: Payer: Self-pay

## 2013-12-07 MED ORDER — METOPROLOL SUCCINATE ER 25 MG PO TB24: 25.0000 mg | ORAL_TABLET | Freq: Every day | ORAL | Status: DC

## 2013-12-07 MED ORDER — APIXABAN 5 MG PO TABS: 5.0000 mg | ORAL_TABLET | Freq: Two times a day (BID) | ORAL | Status: DC

## 2013-12-07 NOTE — Telephone Encounter (Signed)
Rx filled

## 2014-03-08 DIAGNOSIS — M1990 Unspecified osteoarthritis, unspecified site: Secondary | ICD-10-CM | POA: Diagnosis not present

## 2014-03-08 DIAGNOSIS — Z1331 Encounter for screening for depression: Secondary | ICD-10-CM | POA: Diagnosis not present

## 2014-03-08 DIAGNOSIS — Z79899 Other long term (current) drug therapy: Secondary | ICD-10-CM | POA: Diagnosis not present

## 2014-03-08 DIAGNOSIS — E782 Mixed hyperlipidemia: Secondary | ICD-10-CM | POA: Diagnosis not present

## 2014-03-08 DIAGNOSIS — Z23 Encounter for immunization: Secondary | ICD-10-CM | POA: Diagnosis not present

## 2014-03-08 DIAGNOSIS — I4891 Unspecified atrial fibrillation: Secondary | ICD-10-CM | POA: Diagnosis not present

## 2014-03-08 DIAGNOSIS — Z0000 Encounter for general adult medical examination without abnormal findings: Secondary | ICD-10-CM | POA: Diagnosis not present

## 2014-03-09 ENCOUNTER — Encounter: Payer: Self-pay | Admitting: Cardiology

## 2014-03-09 ENCOUNTER — Ambulatory Visit (INDEPENDENT_AMBULATORY_CARE_PROVIDER_SITE_OTHER): Payer: Medicare Other | Admitting: Cardiology

## 2014-03-09 VITALS — BP 120/68 | HR 60 | Ht 60.5 in | Wt 152.0 lb

## 2014-03-09 DIAGNOSIS — I4891 Unspecified atrial fibrillation: Secondary | ICD-10-CM | POA: Insufficient documentation

## 2014-03-09 DIAGNOSIS — Z7901 Long term (current) use of anticoagulants: Secondary | ICD-10-CM | POA: Insufficient documentation

## 2014-03-09 DIAGNOSIS — I4821 Permanent atrial fibrillation: Secondary | ICD-10-CM | POA: Insufficient documentation

## 2014-03-09 DIAGNOSIS — D6869 Other thrombophilia: Secondary | ICD-10-CM | POA: Insufficient documentation

## 2014-03-09 DIAGNOSIS — R259 Unspecified abnormal involuntary movements: Secondary | ICD-10-CM

## 2014-03-09 DIAGNOSIS — I340 Nonrheumatic mitral (valve) insufficiency: Secondary | ICD-10-CM | POA: Insufficient documentation

## 2014-03-09 DIAGNOSIS — E7800 Pure hypercholesterolemia, unspecified: Secondary | ICD-10-CM | POA: Insufficient documentation

## 2014-03-09 DIAGNOSIS — R251 Tremor, unspecified: Secondary | ICD-10-CM | POA: Insufficient documentation

## 2014-03-09 NOTE — Progress Notes (Signed)
[address] [place], [place]  [address] Phone: [phone] Fax:  [phone]  Date:  03/09/2014   ID:  Jessica Berg, DOB October 19, 1924, MRN [id number]  PCP:  Default, Provider, MD   History of Present Illness: Jessica Berg is a 80 y.o. female with event monitor demonstrating atrial fibrillation after multiple EKGs only demonstrated sinus rhythm with PACs.  Her main complaint previously was being tremulousness and feeling pale weak, with bilateral arm and leg numbness. She states that she no longer feels externally tremulous but at times feels internally tremulous, perhaps from atrial fibrillation. She is much improved since decreasing her Toprol from 50 down to 25. Did not tolerate diltiazem due to constipation. Eliquis 04/18/13 She is doing better with less fatigue and dizziness greatly improved. Her tremors are also better controlled. No further constipation. No bleeding. Hemoglobin stable. Awaiting renal function.  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 Feels some racing of the heart. Not often. Toprol. When on 50mg  felt poor, dizzy  03/09/14-doing well. Normal rhythm. No palpitations. Great attitude.    Wt Readings from Last 3 Encounters:  03/09/14 152 lb (68.947 kg)     Past Medical History  Diagnosis Date  . Osteoporosis   . GERD (gastroesophageal reflux disease)   . Hypercholesteremia   . Osteoarthritis   . Chronic renal disease, stage II   . Atrial fibrillation   . MR (mitral regurgitation)     , Mild    Past Surgical History  Procedure Laterality Date  . Back surgery    . Joint replacement      Current Outpatient Prescriptions  Medication Sig Dispense Refill  . apixaban (ELIQUIS) 5 MG TABS tablet Take 1 tablet (5 mg total) by mouth 2 (two) times daily.  180 tablet  2  . celecoxib (CELEBREX) 200 MG capsule Take 200 mg by mouth daily.        . cholecalciferol (VITAMIN D) 1000 UNITS tablet Take 1,000 Units by mouth  daily.        . metoprolol succinate (TOPROL XL) 25 MG 24 hr tablet Take 1 tablet (25 mg total) by mouth daily.  90 tablet  2  . vitamin C (ASCORBIC ACID) 500 MG tablet Take 500 mg by mouth daily.         No current facility-administered medications for this visit.    Allergies:    Allergies  Allergen Reactions  . Darvocet [Propoxyphene N-Acetaminophen] Nausea And Vomiting  . Lodine [Etodolac]   . Meprozine [Meperidine-Promethazine]     Reaction=tongue swelling  . Percocet [Oxycodone-Acetaminophen]   . Ultracet [Tramadol-Acetaminophen] Nausea And Vomiting  . Demerol Nausea And Vomiting  . Hydrocodone Rash  . Penicillins Rash    Social History:  The patient  reports that she has never smoked. She does not have any smokeless tobacco history on file. She reports that she does not drink alcohol or use illicit drugs.   ROS:  Please see the history of present illness.   Denies any fevers, chills, orthopnea, PND   PHYSICAL EXAM: VS:  BP 120/68  Pulse 60  Ht 5' 0.5" (1.537 m)  Wt 152 lb (68.947 kg)  BMI 29.19 kg/m2 Well nourished, well developed, elderly in no acute distress HEENT: normal Neck: no JVD Cardiac:  normal S1, S2; RRR; no murmur Lungs:  clear to auscultation bilaterally, no wheezing, rhonchi or rales Abd: soft, nontender, no hepatomegaly Ext: no edema Skin: warm and dry Neuro:  no focal abnormalities noted, Mild tremulousness   EKG:  03/09/14-sinus rhythm, premature atrial contractions, poor R wave progression     ASSESSMENT AND PLAN:  1. Paroxysmal atrial fibrillation-on Eliquis. Took blood work on 03/08/14 with Dr. Pete Glatter. Awaiting results. Checking blood work every 6 months. She is doing well. Currently in sinus rhythm. 2.  Chronic anticoagulation-no changes made 3. Tremor-may not be related to atrial fibrillation. I noticed tremulousness today despite sinus rhythm.  Signed, Donato Schultz, MD [facility]  03/09/2014 10:01 AM

## 2014-03-09 NOTE — Patient Instructions (Signed)
Your physician recommends that you continue on your current medications as directed. Please refer to the Current Medication list given to you today.  Your physician wants you to follow-up in: 6 months with Dr. Skains. You will receive a reminder letter in the mail two months in advance. If you don't receive a letter, please call our office to schedule the follow-up appointment.  

## 2014-03-16 DIAGNOSIS — H43819 Vitreous degeneration, unspecified eye: Secondary | ICD-10-CM | POA: Diagnosis not present

## 2014-03-16 DIAGNOSIS — Z961 Presence of intraocular lens: Secondary | ICD-10-CM | POA: Diagnosis not present

## 2014-03-16 DIAGNOSIS — M069 Rheumatoid arthritis, unspecified: Secondary | ICD-10-CM | POA: Diagnosis not present

## 2014-03-16 DIAGNOSIS — H26499 Other secondary cataract, unspecified eye: Secondary | ICD-10-CM | POA: Diagnosis not present

## 2014-03-16 DIAGNOSIS — H04129 Dry eye syndrome of unspecified lacrimal gland: Secondary | ICD-10-CM | POA: Diagnosis not present

## 2014-05-23 DIAGNOSIS — H905 Unspecified sensorineural hearing loss: Secondary | ICD-10-CM | POA: Diagnosis not present

## 2014-06-13 ENCOUNTER — Telehealth: Payer: Self-pay | Admitting: *Deleted

## 2014-06-13 NOTE — Telephone Encounter (Signed)
Called number listed below.  It is no longer a working number.  Correct number is [phone].  LM stating pt should be on 5mg  BID based on age, weight and SCr.

## 2014-06-13 NOTE — Telephone Encounter (Signed)
Pharmacist from [facility] called and wanted to verify that the patient is to be taking eliquis 5mg  bid. He recommends, that due to the patients age, she take the 2.5mg  bid. They would like a call back at [phone] to verify this and also get the patients weight and some values from their most recent lab work. The reference number is [id number]. Thanks, MI

## 2014-06-13 NOTE — Telephone Encounter (Signed)
Jessica Berg, Please review. She is greater than 85 however I do believe that her weight and creatinine are within normal range for Eliquis and therefore she should remain on the 5 mg twice a day dose. If something is changed where age greater than 22 equals lower dose please let me know. Thank you.  Loraine Leriche

## 2014-06-14 NOTE — Telephone Encounter (Signed)
Thank you Kennon Rounds. Loraine Leriche

## 2014-07-08 ENCOUNTER — Telehealth: Payer: Self-pay | Admitting: Cardiology

## 2014-07-08 NOTE — Telephone Encounter (Signed)
Received request from Nurse fax box:   To: [facility] Phone number: [phone] Fax number: [phone]

## 2014-08-19 ENCOUNTER — Other Ambulatory Visit: Payer: Self-pay | Admitting: Cardiology

## 2014-09-04 ENCOUNTER — Other Ambulatory Visit: Payer: Self-pay | Admitting: Cardiology

## 2014-09-09 ENCOUNTER — Encounter: Payer: Self-pay | Admitting: Cardiology

## 2014-09-09 ENCOUNTER — Ambulatory Visit (INDEPENDENT_AMBULATORY_CARE_PROVIDER_SITE_OTHER): Payer: Medicare Other | Admitting: Cardiology

## 2014-09-09 VITALS — BP 128/76 | HR 58 | Ht 60.5 in | Wt 148.0 lb

## 2014-09-09 DIAGNOSIS — I4891 Unspecified atrial fibrillation: Secondary | ICD-10-CM | POA: Diagnosis not present

## 2014-09-09 DIAGNOSIS — Z79899 Other long term (current) drug therapy: Secondary | ICD-10-CM | POA: Diagnosis not present

## 2014-09-09 DIAGNOSIS — R259 Unspecified abnormal involuntary movements: Secondary | ICD-10-CM

## 2014-09-09 DIAGNOSIS — I480 Paroxysmal atrial fibrillation: Secondary | ICD-10-CM | POA: Insufficient documentation

## 2014-09-09 DIAGNOSIS — R251 Tremor, unspecified: Secondary | ICD-10-CM

## 2014-09-09 LAB — BASIC METABOLIC PANEL
BUN: 28 mg/dL — ABNORMAL HIGH (ref 6–23)
CO2: 29 meq/L (ref 19–32)
CREATININE: 1.1 mg/dL (ref 0.4–1.2)
Calcium: 9.8 mg/dL (ref 8.4–10.5)
Chloride: 104 mEq/L (ref 96–112)
GFR: 48.28 mL/min — ABNORMAL LOW (ref >60.00)
GLUCOSE: 86 mg/dL (ref 70–99)
Potassium: 4.4 mEq/L (ref 3.5–5.1)
Sodium: 137 mEq/L (ref 135–145)

## 2014-09-09 LAB — CBC
HEMATOCRIT: 35.9 % — AB (ref 36.0–46.0)
HEMOGLOBIN: 11.9 g/dL — AB (ref 12.0–15.0)
MCHC: 33.3 g/dL (ref 30.0–36.0)
MCV: 92.4 fl (ref 78.0–100.0)
Platelets: 177 10*3/uL (ref 150.0–400.0)
RBC: 3.88 Mil/uL (ref 3.87–5.11)
RDW: 12.4 % (ref 11.5–15.5)
WBC: 6 10*3/uL (ref 4.0–10.5)

## 2014-09-09 NOTE — Patient Instructions (Addendum)
The current medical regimen is effective;  continue present plan and medications. Please try to avoid taking Celebrex with Eliquis.  Please have blood work today. (CBC,BMP)  Follow up in 6 months with Dr. Anne Fu.  You will receive a letter in the mail 2 months before you are due.  Please call us when you receive this letter to schedule your follow up appointment.

## 2014-09-09 NOTE — Progress Notes (Signed)
[address] [place], [place]  [address] Phone: [phone] Fax:  [phone]  Date:  09/09/2014   ID:  Jessica Berg, DOB May 12, 1924, MRN [id number]  PCP:  Ginette Otto, MD   History of Present Illness: Jessica Berg is a 80 y.o. female with event monitor demonstrating atrial fibrillation after multiple EKGs only demonstrated sinus rhythm with PACs.  Her main complaint previously was being tremulousness and feeling pale weak, with bilateral arm and leg numbness. She states that she no longer feels externally tremulous but at times feels internally tremulous, perhaps from atrial fibrillation. She is much improved since decreasing her Toprol from 50 down to 25. Did not tolerate diltiazem due to constipation.  Eliquis 04/18/13 She is doing better with less fatigue and dizziness greatly improved. Her tremors are also better controlled. No further constipation. No bleeding. Hemoglobin stable. Awaiting renal function.  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 Feels some racing of the heart. Not often. Toprol. When on 50mg  felt poor, dizzy  03/09/14-doing well. Normal rhythm. No palpitations. Great attitude.  09/09/14-overall, she is doing well, rare palpitations. No bleeding. No strokelike symptoms. No shortness of breath although she does feel transient need to take any deep breaths when she feels her palpitations. Unfortunately, her daughter age 73 who is a grandmother is well, has breast cancer, stage III. She is undergoing chemotherapy.    Wt Readings from Last 3 Encounters:  09/09/14 148 lb (67.132 kg)  03/09/14 152 lb (68.947 kg)     Past Medical History  Diagnosis Date  . Osteoporosis   . GERD (gastroesophageal reflux disease)   . Hypercholesteremia   . Osteoarthritis   . Chronic renal disease, stage II   . Atrial fibrillation   . MR (mitral regurgitation)     , Mild    Past Surgical History  Procedure Laterality Date  .  Back surgery    . Joint replacement      Current Outpatient Prescriptions  Medication Sig Dispense Refill  . celecoxib (CELEBREX) 200 MG capsule Take 200 mg by mouth daily.        . cholecalciferol (VITAMIN D) 1000 UNITS tablet Take 1,000 Units by mouth daily.        03/11/14 ELIQUIS 5 MG TABS tablet TAKE 1 TABLET TWICE A DAY  180 tablet  0  . metoprolol succinate (TOPROL-XL) 25 MG 24 hr tablet TAKE 1 TABLET DAILY  90 tablet  0  . vitamin C (ASCORBIC ACID) 500 MG tablet Take 500 mg by mouth daily.         No current facility-administered medications for this visit.    Allergies:    Allergies  Allergen Reactions  . Darvocet [Propoxyphene N-Acetaminophen] Nausea And Vomiting  . Lodine [Etodolac]   . Meprozine [Meperidine-Promethazine]     Reaction=tongue swelling  . Percocet [Oxycodone-Acetaminophen]   . Ultracet [Tramadol-Acetaminophen] Nausea And Vomiting  . Demerol Nausea And Vomiting  . Hydrocodone Rash  . Penicillins Rash    Social History:  The patient  reports that she has never smoked. She does not have any smokeless tobacco history on file. She reports that she does not drink alcohol or use illicit drugs.   ROS:  Please see the history of present illness.   Denies any fevers, chills, orthopnea, PND, bleeding. No falls.   PHYSICAL EXAM: VS:  BP 128/76  Pulse 58  Ht 5' 0.5" (1.537 m)  Wt 148 lb (67.132 kg)  BMI 28.42 kg/m2 Well nourished, well developed, elderly in no acute distress HEENT: normal Neck: no JVD Cardiac:  normal S1, S2; RRR; no murmur Lungs:  clear to auscultation bilaterally, no wheezing, rhonchi or rales Abd: soft, nontender, no hepatomegaly Ext: no edema Skin: warm and dry Neuro: no focal abnormalities noted, no tremulousness   EKG:  03/09/14-sinus rhythm, premature atrial contractions, poor R wave progression     ASSESSMENT AND PLAN:  1. Paroxysmal atrial fibrillation-on Eliquis. Checking blood work every 6 months. She is doing well. Currently in  sinus rhythm. 2. Chronic anticoagulation-no changes made. Eliquis is currently 5 mg twice a day. Try to avoid Celebrex with Eliquis. 3. Tremor-may not be related to atrial fibrillation. I noticed tremulousness previously despite sinus rhythm. 4. Will pray for daughter with breast cancer. 5. 6 month follow up.  Signed, Donato Schultz, MD [facility]  09/09/2014 9:17 AM

## 2014-09-12 ENCOUNTER — Other Ambulatory Visit: Payer: Self-pay | Admitting: *Deleted

## 2014-09-12 DIAGNOSIS — Z79899 Other long term (current) drug therapy: Secondary | ICD-10-CM

## 2014-09-12 DIAGNOSIS — I480 Paroxysmal atrial fibrillation: Secondary | ICD-10-CM

## 2014-11-15 ENCOUNTER — Other Ambulatory Visit (INDEPENDENT_AMBULATORY_CARE_PROVIDER_SITE_OTHER): Payer: Medicare Other | Admitting: *Deleted

## 2014-11-15 DIAGNOSIS — Z79899 Other long term (current) drug therapy: Secondary | ICD-10-CM | POA: Diagnosis not present

## 2014-11-15 DIAGNOSIS — I480 Paroxysmal atrial fibrillation: Secondary | ICD-10-CM

## 2014-11-15 LAB — CBC
HCT: 35.8 % — ABNORMAL LOW (ref 36.0–46.0)
Hemoglobin: 11.6 g/dL — ABNORMAL LOW (ref 12.0–15.0)
MCHC: 32.5 g/dL (ref 30.0–36.0)
MCV: 93.2 fl (ref 78.0–100.0)
PLATELETS: 175 10*3/uL (ref 150.0–400.0)
RBC: 3.84 Mil/uL — ABNORMAL LOW (ref 3.87–5.11)
RDW: 12.5 % (ref 11.5–15.5)
WBC: 6 10*3/uL (ref 4.0–10.5)

## 2014-11-17 ENCOUNTER — Other Ambulatory Visit: Payer: Self-pay | Admitting: Cardiology

## 2014-11-22 DIAGNOSIS — L309 Dermatitis, unspecified: Secondary | ICD-10-CM | POA: Diagnosis not present

## 2014-11-29 ENCOUNTER — Telehealth: Payer: Self-pay | Admitting: Cardiology

## 2014-11-29 NOTE — Telephone Encounter (Signed)
New Message        Pt calling stating that she received her medication from [facility] and was told that she needed to schedule an appt to be seen by Dr. Anne Fu. Pt states she saw him in Sept and wants to know if she does need to schedule an appt or if she should disregard. Please call back and advise.

## 2014-11-29 NOTE — Telephone Encounter (Signed)
Pt is due for follow up in March (6 Months).  Refill was done for 90 day supply and the team included that the patient should call for an appointment since her refill will take her into March.  appt was scheduled.

## 2014-12-03 ENCOUNTER — Other Ambulatory Visit: Payer: Self-pay | Admitting: Cardiology

## 2015-02-14 ENCOUNTER — Other Ambulatory Visit: Payer: Self-pay | Admitting: Cardiology

## 2015-03-06 ENCOUNTER — Ambulatory Visit: Payer: 59 | Admitting: Cardiology

## 2015-03-13 DIAGNOSIS — N183 Chronic kidney disease, stage 3 (moderate): Secondary | ICD-10-CM | POA: Diagnosis not present

## 2015-03-13 DIAGNOSIS — Z0000 Encounter for general adult medical examination without abnormal findings: Secondary | ICD-10-CM | POA: Diagnosis not present

## 2015-03-13 DIAGNOSIS — M810 Age-related osteoporosis without current pathological fracture: Secondary | ICD-10-CM | POA: Diagnosis not present

## 2015-03-13 DIAGNOSIS — E780 Pure hypercholesterolemia: Secondary | ICD-10-CM | POA: Diagnosis not present

## 2015-03-13 DIAGNOSIS — I482 Chronic atrial fibrillation: Secondary | ICD-10-CM | POA: Diagnosis not present

## 2015-03-13 DIAGNOSIS — Z66 Do not resuscitate: Secondary | ICD-10-CM | POA: Diagnosis not present

## 2015-03-13 DIAGNOSIS — Z79899 Other long term (current) drug therapy: Secondary | ICD-10-CM | POA: Diagnosis not present

## 2015-04-03 ENCOUNTER — Ambulatory Visit (INDEPENDENT_AMBULATORY_CARE_PROVIDER_SITE_OTHER): Payer: Medicare Other | Admitting: Cardiology

## 2015-04-03 ENCOUNTER — Encounter: Payer: Self-pay | Admitting: Cardiology

## 2015-04-03 VITALS — BP 150/60 | HR 54 | Ht 62.0 in | Wt 144.8 lb

## 2015-04-03 DIAGNOSIS — I480 Paroxysmal atrial fibrillation: Secondary | ICD-10-CM | POA: Diagnosis not present

## 2015-04-03 DIAGNOSIS — I340 Nonrheumatic mitral (valve) insufficiency: Secondary | ICD-10-CM

## 2015-04-03 DIAGNOSIS — Z7901 Long term (current) use of anticoagulants: Secondary | ICD-10-CM

## 2015-04-03 DIAGNOSIS — E7800 Pure hypercholesterolemia, unspecified: Secondary | ICD-10-CM

## 2015-04-03 NOTE — Progress Notes (Signed)
[address], [address] [place], [place]  [address] Phone: [phone] Fax:  [phone]  Date:  04/03/2015   ID:  Jessica Berg, DOB November 20, 1924, MRN [id number]  PCP:  Ginette Otto, MD   History of Present Illness: Jessica Berg is a 81 y.o. female with event monitor demonstrating atrial fibrillation after multiple EKGs only demonstrated sinus rhythm with PACs.  Her main complaint previously was being tremulousness and feeling pale weak, with bilateral arm and leg numbness. She states that she no longer feels externally tremulous but at times feels internally tremulous, perhaps from atrial fibrillation. She is much improved since decreasing her Toprol from 50 down to 25. Did not tolerate diltiazem due to constipation.  Eliquis 04/18/13 She is doing better with less fatigue and dizziness greatly improved. Her tremors are also better controlled. No further constipation. No bleeding. Hemoglobin stable. Awaiting renal function.  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 Feels some racing of the heart. Not often. Toprol. When on 50mg  felt poor, dizzy  03/09/14-doing well. Normal rhythm. No palpitations. Great attitude.  09/09/14-overall, she is doing well, rare palpitations. No bleeding. No strokelike symptoms. No shortness of breath although she does feel transient need to take any deep breaths when she feels her palpitations. Unfortunately, her daughter age 59 who is a grandmother is well, has breast cancer, stage III. She is undergoing chemotherapy.  04/03/15-has been under increased stress with her other daughter who recently has been moving. No syncope. No chest pain. Every now and then she will feel this tremulousness episode when she takes a deep breath and it seems to go away.    Wt Readings from Last 3 Encounters:  04/03/15 144 lb 12.8 oz (65.681 kg)  09/09/14 148 lb (67.132 kg)  03/09/14 152 lb (68.947 kg)     Past Medical History  Diagnosis  Date  . Osteoporosis   . GERD (gastroesophageal reflux disease)   . Hypercholesteremia   . Osteoarthritis   . Chronic renal disease, stage II   . Atrial fibrillation   . MR (mitral regurgitation)     , Mild    Past Surgical History  Procedure Laterality Date  . Back surgery    . Joint replacement      Current Outpatient Prescriptions  Medication Sig Dispense Refill  . apixaban (ELIQUIS) 5 MG TABS tablet Take 1 tablet (5 mg total) by mouth 2 (two) times daily. 180 tablet 0  . celecoxib (CELEBREX) 200 MG capsule Take 200 mg by mouth daily.      . cholecalciferol (VITAMIN D) 1000 UNITS tablet Take 1,000 Units by mouth daily.      . metoprolol succinate (TOPROL-XL) 25 MG 24 hr tablet TAKE 1 TABLET DAILY 90 tablet 1  . vitamin C (ASCORBIC ACID) 500 MG tablet Take 500 mg by mouth daily.       No current facility-administered medications for this visit.    Allergies:    Allergies  Allergen Reactions  . Darvocet [Propoxyphene N-Acetaminophen] Nausea And Vomiting  . Lodine [Etodolac]   . Meprozine [Meperidine-Promethazine]     Reaction=tongue swelling  . Percocet [Oxycodone-Acetaminophen]   . Ultracet [Tramadol-Acetaminophen] Nausea And Vomiting  . Demerol Nausea And Vomiting  . Hydrocodone Rash  . Penicillins Rash    Social History:  The patient  reports that she has never smoked. She does not have any smokeless tobacco history on file. She reports that she does not drink alcohol or use  illicit drugs.   ROS:  Please see the history of present illness.   Denies any fevers, chills, orthopnea, PND, bleeding. No falls.   PHYSICAL EXAM: VS:  BP 150/60 mmHg  Pulse 54  Ht 5\' 2"  (1.575 m)  Wt 144 lb 12.8 oz (65.681 kg)  BMI 26.48 kg/m2 Well nourished, well developed, elderly in no acute distress HEENT: normal Neck: no JVD Cardiac:  normal S1, S2; RRR; no murmur Lungs:  clear to auscultation bilaterally, no wheezing, rhonchi or rales Abd: soft, nontender, no hepatomegaly Ext:  no edema Skin: warm and dry Neuro: no focal abnormalities noted, no tremulousness mildly anxious  EKG:  04/03/15-sinus bradycardia with premature atrial complex, left anterior fascicular block-prior 03/09/14-sinus rhythm, premature atrial contractions, poor R wave progression     ASSESSMENT AND PLAN:  1. Paroxysmal atrial fibrillation-on Eliquis. Checking blood work every 6 months. She is doing well. Dr. 03/11/14 checked blood work recently and it was reassuring. Her hemoglobin had increased to 12.5. Creatinine remains stable at 1.1. 02/2015 . Currently in sinus rhythm. 2. Chronic anticoagulation-no changes made. Eliquis is currently 5 mg twice a day.  3. Left anterior fascicular block-stable. No high-risk symptoms such as syncope. 4. Essential hypertension-blood pressure is mildly elevated today, on repeat 148/70. Continue to monitor. She states that usually at home it is in the 120 range. 5. Tremor-may not be related to atrial fibrillation. I noticed tremulousness previously despite sinus rhythm. 6. Mild mitral regurgitation-no clinical symptoms. 7. Will pray for daughter with breast cancer. I mentioned her today and she did not want to discuss much. Her other daughter has been moving, transporting her animals. Stressful. Developed walking pneumonia she states. 8. 6 month follow up.  Signed, 03/2015, MD [facility]  04/03/2015 11:26 AM

## 2015-04-03 NOTE — Patient Instructions (Signed)
The current medical regimen is effective;  continue present plan and medications.  Follow up in 6 months with Dr. Skains.  You will receive a letter in the mail 2 months before you are due.  Please call us when you receive this letter to schedule your follow up appointment.  Thank you for choosing [facility]!!     

## 2015-05-14 ENCOUNTER — Other Ambulatory Visit: Payer: Self-pay | Admitting: Cardiology

## 2015-05-28 ENCOUNTER — Other Ambulatory Visit: Payer: Self-pay | Admitting: Cardiology

## 2015-06-02 ENCOUNTER — Telehealth: Payer: Self-pay | Admitting: Cardiology

## 2015-06-02 NOTE — Telephone Encounter (Signed)
Pt c/o medication issue:  1. Name of Medication: Metoprolol Succinate  2. How are you currently taking this medication (dosage and times per day)? 25 mg once daily  3. Are you having a reaction (difficulty breathing--STAT)? Rash  4. What is your medication issue? Pt is unsure is rash is related to medication    Pt c/o medication issue:  1. Name of Medication: Eliquis   2. How are you currently taking this medication (dosage and times per day)? 5 mg BID  3. Are you having a reaction (difficulty breathing--STAT)? Rash  4. What is your medication issue? Pt is unsure is rash is related to medication

## 2015-06-02 NOTE — Telephone Encounter (Signed)
Spoke with pt who is reporting for the past 2 days she has had a red, raised, itchy rash on her chest, under her arms, her lower back and around her wrist.  She feels like her insides are itching.  She denies any SOB or throat tightness.  She has not tried taking anything for it.  She denies using any new lotions, perfumes, detergent etc.  Advised pt to use Benadryl OTC po and go to Urgent Care for evaluation since the office is going to be closing soon.  Advised not to wait over the weekend.  Will forward to Dr Anne Fu for review and his knowledge.

## 2015-06-03 NOTE — Telephone Encounter (Signed)
Agree with plan. Also could discuss with Dr. Pete Glatter if she has not already. Donato Schultz, MD

## 2015-06-07 NOTE — Telephone Encounter (Signed)
Pt returned call. Informed pt that Dr. Anne Fu agrees with plan provided by Avie Arenas, RN and would also discuss with Dr. Pete Glatter if she hasn't already. Pt states she has been using Benadryl and rash seems to be getting better. Informed pt that if the rash worsened to contact Dr. Laverle Hobby office. Pt verbalized understanding and was in agreement with this plan.

## 2015-06-07 NOTE — Telephone Encounter (Signed)
Left message to call back  

## 2015-06-22 ENCOUNTER — Telehealth: Payer: Self-pay | Admitting: Cardiology

## 2015-06-22 NOTE — Telephone Encounter (Signed)
Pt states she will just stay on Eliquis for now and call back if the rash continues.

## 2015-06-22 NOTE — Telephone Encounter (Signed)
Pt c/o medication issue:  1. Name of Medication: Eliquis   2. How are you currently taking this medication (dosage and times per day)? 5 mg twice   3. Are you having a reaction (difficulty breathing--STAT)? Yes, the pt is reporting a rash   4. What is your medication issue? Pt called state that she had a rash, she wanted to take Benadryl. She spoke with a pharmasist who gave her the approval to take the Benedryl. Pt states that she still has the rash and it is irritating and painful. She believes that it maybe due to the Eliquis. Requests a call back to discuss

## 2015-06-22 NOTE — Telephone Encounter (Signed)
Would be odd for her to be on Eliquis for 2 years and now developing a rash. However, I have no problem changing her to Xarelto 20mg  PO QD.  Thanks  , MD

## 2015-06-22 NOTE — Telephone Encounter (Signed)
Spoke with pt who is reporting that her rash continue to be present and is very itchy.  She read on the information from the pharmacy that Eliquis may be the cause.  She saw her PCP who instructed her to take Benadryl which she did for 7 days.  The rash got better but was not completely gone.  It did make the itch better.  She has been on Eliquis atleast since 10/14.  The rash started in June.  Advised that since she is taking it twice a day everyday I wouldn't think that the rash would "come and go" but would be present all the time.  She is requested Dr Anne Fu review the information and consider changing her medication to see if there is any benefit.  Advised pt I will forward information to Dr Anne Fu and call her back with further instructions.

## 2015-08-13 ENCOUNTER — Other Ambulatory Visit: Payer: Self-pay | Admitting: Cardiology

## 2015-08-26 ENCOUNTER — Other Ambulatory Visit: Payer: Self-pay | Admitting: Cardiology

## 2015-10-02 ENCOUNTER — Encounter: Payer: Self-pay | Admitting: Cardiology

## 2015-10-02 ENCOUNTER — Ambulatory Visit (INDEPENDENT_AMBULATORY_CARE_PROVIDER_SITE_OTHER): Payer: Medicare Other | Admitting: Cardiology

## 2015-10-02 VITALS — BP 126/82 | HR 67 | Ht 61.0 in | Wt 142.6 lb

## 2015-10-02 DIAGNOSIS — I340 Nonrheumatic mitral (valve) insufficiency: Secondary | ICD-10-CM

## 2015-10-02 DIAGNOSIS — Z7901 Long term (current) use of anticoagulants: Secondary | ICD-10-CM | POA: Diagnosis not present

## 2015-10-02 DIAGNOSIS — E7800 Pure hypercholesterolemia, unspecified: Secondary | ICD-10-CM

## 2015-10-02 DIAGNOSIS — I480 Paroxysmal atrial fibrillation: Secondary | ICD-10-CM | POA: Diagnosis not present

## 2015-10-02 MED ORDER — METOPROLOL SUCCINATE ER 25 MG PO TB24: 12.5000 mg | ORAL_TABLET | Freq: Every day | ORAL | Status: DC

## 2015-10-02 NOTE — Patient Instructions (Signed)
Medication Instructions:  Please decrease Metoprolol succinate to 12.5 mg a day.  Continue all other medications as listed.  Follow-Up: Follow up in 6 months with Dr. Anne Fu.  You will receive a letter in the mail 2 months before you are due.  Please call us when you receive this letter to schedule your follow up appointment.  Thank you for choosing [facility]!!

## 2015-10-02 NOTE — Progress Notes (Signed)
[address] [place], [place]  [address] Phone: [phone] Fax:  [phone]  Date:  10/02/2015   ID:  Jessica Berg, DOB 24-Oct-1924, MRN [id number]  PCP:  Ginette Otto, MD   History of Present Illness: Jessica Berg is a 81 y.o. female with event monitor demonstrating atrial fibrillation after multiple EKGs only demonstrated sinus rhythm with PACs.  Her main complaint previously was being tremulousness and feeling pale weak, with bilateral arm and leg numbness. She states that she no longer feels externally tremulous but at times feels internally tremulous, perhaps from atrial fibrillation. She is much improved since decreasing her Toprol from 50 down to 25. Did not tolerate diltiazem due to constipation.  Eliquis 04/18/13 She is doing better with less fatigue and dizziness greatly improved. Her tremors are also better controlled. No further constipation. No bleeding. Hemoglobin stable. Awaiting renal function.  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 Feels some racing of the heart. Not often. Toprol. When on 50mg  felt poor, dizzy  03/09/14-doing well. Normal rhythm. No palpitations. Great attitude.  09/09/14-overall, she is doing well, rare palpitations. No bleeding. No strokelike symptoms. No shortness of breath although she does feel transient need to take any deep breaths when she feels her palpitations. Unfortunately, her daughter age 74 who is a grandmother is well, has breast cancer, stage III. She is undergoing chemotherapy.  04/03/15-has been under increased stress with her other daughter who recently has been moving. No syncope. No chest pain. Every now and then she will feel this tremulousness episode when she takes a deep breath and it seems to go away.  10/02/15-had a bout of rash. There were more like excoriations on different parts of her body she states. This has resolved. She was concerned at one point that it may be L  Aquinas. She was concerned about taking Benadryl with this. She has been under a lot of stress as above. Her daughter died from breast cancer. Her sister came to live with her transiently. Her sister has bad Parkinson's disease. She ended up having a fall, bruising the right side of her leg.    Wt Readings from Last 3 Encounters:  10/02/15 142 lb 9.6 oz (64.683 kg)  04/03/15 144 lb 12.8 oz (65.681 kg)  09/09/14 148 lb (67.132 kg)     Past Medical History  Diagnosis Date  . Osteoporosis   . GERD (gastroesophageal reflux disease)   . Hypercholesteremia   . Osteoarthritis   . Chronic renal disease, stage II   . Atrial fibrillation (HCC)   . MR (mitral regurgitation)     , Mild    Past Surgical History  Procedure Laterality Date  . Back surgery    . Joint replacement      Current Outpatient Prescriptions  Medication Sig Dispense Refill  . celecoxib (CELEBREX) 200 MG capsule Take 200 mg by mouth daily.      . cholecalciferol (VITAMIN D) 1000 UNITS tablet Take 1,000 Units by mouth daily.      09/11/14 ELIQUIS 5 MG TABS tablet TAKE 1 TABLET TWICE A DAY 180 tablet 0  . metoprolol succinate (TOPROL-XL) 25 MG 24 hr tablet TAKE 1 TABLET DAILY 90 tablet 0  . vitamin C (ASCORBIC ACID) 500 MG tablet Take 500 mg by mouth daily.       No current facility-administered medications for this visit.    Allergies:    Allergies  Allergen Reactions  . Darvocet [  Propoxyphene N-Acetaminophen] Nausea And Vomiting  . Lodine [Etodolac]   . Meprozine [Meperidine-Promethazine]     Reaction=tongue swelling  . Percocet [Oxycodone-Acetaminophen]   . Ultracet [Tramadol-Acetaminophen] Nausea And Vomiting  . Demerol Nausea And Vomiting  . Hydrocodone Rash  . Penicillins Rash    Social History:  The patient  reports that she has never smoked. She does not have any smokeless tobacco history on file. She reports that she does not drink alcohol or use illicit drugs.   ROS:  Please see the history of present  illness.   Denies any fevers, chills, orthopnea, PND, bleeding. No falls.   PHYSICAL EXAM: VS:  BP 126/82 mmHg  Pulse 67  Ht 5\' 1"  (1.549 m)  Wt 142 lb 9.6 oz (64.683 kg)  BMI 26.96 kg/m2  SpO2 98% Well nourished, well developed, elderly in no acute distress HEENT: normal Neck: no JVD Cardiac:  normal S1, S2; RRR; no murmur Lungs:  clear to auscultation bilaterally, no wheezing, rhonchi or rales Abd: soft, nontender, no hepatomegaly Ext: no edema Skin: warm and dry Neuro: no focal abnormalities noted, no tremulousness mildly anxious  EKG:  04/03/15-sinus bradycardia with premature atrial complex, left anterior fascicular block-prior 03/09/14-sinus rhythm, premature atrial contractions, poor R wave progression     ASSESSMENT AND PLAN:  1. Paroxysmal atrial fibrillation-on Eliquis. Checking blood work every 6 months. She is doing well. Dr. 03/11/14 checked blood work recently and it was reassuring.  Creatinine remains stable at 1.1. 02/2015 . Currently in sinus rhythm. Light headed. I will try once again to cut back her Toprol to 12.5 mg. She is concerned about having the ability to cut those pills in half. 2. Chronic anticoagulation-no changes made. Eliquis is currently 5 mg twice a day.  3. Left anterior fascicular block-stable. No high-risk symptoms such as syncope. 4. Essential hypertension- She states that usually at home it is in the 120. Sometimes she feels lightheaded. I'm decreasing her Toprol to 12.5. 5. Tremor-may not be related to atrial fibrillation. I noticed tremulousness previously despite sinus rhythm. 6. Mild mitral regurgitation-no clinical symptoms. 7. Grief-loss of her daughter with breast cancer at age 35. 8. 6 month follow up.  Signed, 66, MD [facility]  10/02/2015 11:00 AM

## 2015-11-10 ENCOUNTER — Other Ambulatory Visit: Payer: Self-pay | Admitting: Cardiology

## 2015-11-20 ENCOUNTER — Ambulatory Visit
Admission: RE | Admit: 2015-11-20 | Discharge: 2015-11-20 | Disposition: A | Discharge status: Home / Self Care | Payer: Medicare Other | Source: Ambulatory Visit | Attending: Internal Medicine | Admitting: Internal Medicine

## 2015-11-20 ENCOUNTER — Other Ambulatory Visit: Payer: Self-pay | Admitting: Internal Medicine

## 2015-11-20 DIAGNOSIS — M25551 Pain in right hip: Secondary | ICD-10-CM | POA: Diagnosis not present

## 2015-11-20 DIAGNOSIS — G5701 Lesion of sciatic nerve, right lower limb: Secondary | ICD-10-CM

## 2015-11-20 DIAGNOSIS — M545 Low back pain: Secondary | ICD-10-CM | POA: Diagnosis not present

## 2015-11-22 DIAGNOSIS — M7061 Trochanteric bursitis, right hip: Secondary | ICD-10-CM | POA: Diagnosis not present

## 2015-11-23 DIAGNOSIS — M25551 Pain in right hip: Secondary | ICD-10-CM | POA: Diagnosis not present

## 2015-11-23 DIAGNOSIS — G8929 Other chronic pain: Secondary | ICD-10-CM | POA: Diagnosis not present

## 2015-11-24 ENCOUNTER — Other Ambulatory Visit: Payer: Self-pay | Admitting: Cardiology

## 2015-11-27 DIAGNOSIS — M1611 Unilateral primary osteoarthritis, right hip: Secondary | ICD-10-CM | POA: Diagnosis not present

## 2015-12-05 DIAGNOSIS — M5441 Lumbago with sciatica, right side: Secondary | ICD-10-CM | POA: Diagnosis not present

## 2015-12-05 DIAGNOSIS — M9903 Segmental and somatic dysfunction of lumbar region: Secondary | ICD-10-CM | POA: Diagnosis not present

## 2015-12-05 DIAGNOSIS — M5136 Other intervertebral disc degeneration, lumbar region: Secondary | ICD-10-CM | POA: Diagnosis not present

## 2015-12-06 DIAGNOSIS — M5441 Lumbago with sciatica, right side: Secondary | ICD-10-CM | POA: Diagnosis not present

## 2015-12-06 DIAGNOSIS — M5136 Other intervertebral disc degeneration, lumbar region: Secondary | ICD-10-CM | POA: Diagnosis not present

## 2015-12-06 DIAGNOSIS — M9903 Segmental and somatic dysfunction of lumbar region: Secondary | ICD-10-CM | POA: Diagnosis not present

## 2015-12-07 DIAGNOSIS — M5441 Lumbago with sciatica, right side: Secondary | ICD-10-CM | POA: Diagnosis not present

## 2015-12-07 DIAGNOSIS — M5136 Other intervertebral disc degeneration, lumbar region: Secondary | ICD-10-CM | POA: Diagnosis not present

## 2015-12-07 DIAGNOSIS — M9903 Segmental and somatic dysfunction of lumbar region: Secondary | ICD-10-CM | POA: Diagnosis not present

## 2015-12-08 DIAGNOSIS — M1611 Unilateral primary osteoarthritis, right hip: Secondary | ICD-10-CM | POA: Diagnosis not present

## 2015-12-13 DIAGNOSIS — M1611 Unilateral primary osteoarthritis, right hip: Secondary | ICD-10-CM | POA: Diagnosis not present

## 2015-12-19 ENCOUNTER — Telehealth: Payer: Self-pay | Admitting: Cardiology

## 2015-12-19 NOTE — Telephone Encounter (Signed)
Pt very weak x 3 days-last time felt this way adjusted Eliquis-pls advise [phone]

## 2015-12-19 NOTE — Telephone Encounter (Signed)
Pt given Dr Anne Fu' recommendation about contacting Dr Pete Glatter, pt verbalized understanding.

## 2015-12-19 NOTE — Telephone Encounter (Signed)
Please ask Dr. Pete Glatter, PCP for suggestions first. Thanks  Donato Schultz, MD

## 2015-12-19 NOTE — Telephone Encounter (Signed)
Pt states that she continues to have symptoms of weakness, shakiness,  feeling lightheaded that can happen very quickly.  Pt states that drinking orange juice or Coke help but it takes time. Pt states she discussed these symptoms with Dr Jafari Mckillop Fu in October 2016, her Toprol XL was decreased from 25mg  daily to 12.5mg  daily at that time.  Pt states decreasing Toprol helped her symptoms for a time but now her symptoms are similar to when she saw Dr in October 2016. Pt states she is asking if she should make any changes in her medication since she is having more frequent symptoms again.  Pt states she does not know her heart rate or BP. Pt advised I will forward to Dr November 2016 for review.

## 2015-12-26 ENCOUNTER — Other Ambulatory Visit: Payer: Self-pay | Admitting: Nurse Practitioner

## 2015-12-26 DIAGNOSIS — R1084 Generalized abdominal pain: Secondary | ICD-10-CM

## 2015-12-27 ENCOUNTER — Ambulatory Visit
Admission: RE | Admit: 2015-12-27 | Discharge: 2015-12-27 | Disposition: A | Discharge status: Home / Self Care | Payer: Self-pay | Source: Ambulatory Visit | Attending: Nurse Practitioner | Admitting: Nurse Practitioner

## 2015-12-27 DIAGNOSIS — R1084 Generalized abdominal pain: Secondary | ICD-10-CM

## 2016-04-01 ENCOUNTER — Ambulatory Visit (INDEPENDENT_AMBULATORY_CARE_PROVIDER_SITE_OTHER): Payer: Medicare Other | Admitting: Cardiology

## 2016-04-01 ENCOUNTER — Encounter: Payer: Self-pay | Admitting: Cardiology

## 2016-04-01 VITALS — BP 136/70 | HR 61 | Ht 60.0 in | Wt 141.2 lb

## 2016-04-01 DIAGNOSIS — R251 Tremor, unspecified: Secondary | ICD-10-CM | POA: Diagnosis not present

## 2016-04-01 DIAGNOSIS — Z7901 Long term (current) use of anticoagulants: Secondary | ICD-10-CM | POA: Diagnosis not present

## 2016-04-01 DIAGNOSIS — I480 Paroxysmal atrial fibrillation: Secondary | ICD-10-CM | POA: Diagnosis not present

## 2016-04-01 DIAGNOSIS — I340 Nonrheumatic mitral (valve) insufficiency: Secondary | ICD-10-CM

## 2016-04-01 DIAGNOSIS — I444 Left anterior fascicular block: Secondary | ICD-10-CM

## 2016-04-01 NOTE — Patient Instructions (Signed)

## 2016-04-01 NOTE — Progress Notes (Signed)
[address], [address] [place], [place]  [address] Phone: [phone] Fax:  [phone]  Date:  04/01/2016   ID:  Jessica Berg, DOB Jul 29, 1924, MRN [id number]  PCP:  Ginette Otto, MD   History of Present Illness: Jessica Berg is a 82 y.o. female with event monitor demonstrating atrial fibrillation after multiple EKGs only demonstrated sinus rhythm with PACs.  Her main complaint previously was being tremulousness and feeling pale weak, with bilateral arm and leg numbness. She states that she no longer feels externally tremulous but at times feels internally tremulous, perhaps from atrial fibrillation. She is much improved since decreasing her Toprol from 50 down to 25. Did not tolerate diltiazem due to constipation.  Eliquis 04/18/13 She is doing better with less fatigue and dizziness greatly improved. Her tremors are also better controlled. No further constipation. No bleeding. Hemoglobin stable. Awaiting renal function.  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 Feels some racing of the heart. Not often. Toprol. When on 50mg  felt poor, dizzy  03/09/14-doing well. Normal rhythm. No palpitations. Great attitude.  09/09/14-overall, she is doing well, rare palpitations. No bleeding. No strokelike symptoms. No shortness of breath although she does feel transient need to take any deep breaths when she feels her palpitations. Unfortunately, her daughter age 61 who is a grandmother is well, has breast cancer, stage III. She is undergoing chemotherapy.  04/03/15-has been under increased stress with her other daughter who recently has been moving. No syncope. No chest pain. Every now and then she will feel this tremulousness episode when she takes a deep breath and it seems to go away.  10/02/15-had a bout of rash. There were more like excoriations on different parts of her body she states. This has resolved. She was concerned at one point that it may be L  Aquinas. She was concerned about taking Benadryl with this. She has been under a lot of stress as above. Her daughter died from breast cancer. Her sister came to live with her transiently. Her sister has bad Parkinson's disease. She ended up having a fall, bruising the right side of her leg.  4/17-17-previously had a fall on her right side caused hematoma/ecchymosis. Now resolved. Minor staining she states. She's not had any further falls, no syncopal, no chest pain, no shortness of breath. No palpitations. Easter service was good for her.    Wt Readings from Last 3 Encounters:  04/01/16 141 lb 3.2 oz (64.048 kg)  10/02/15 142 lb 9.6 oz (64.683 kg)  04/03/15 144 lb 12.8 oz (65.681 kg)     Past Medical History  Diagnosis Date  . Osteoporosis   . GERD (gastroesophageal reflux disease)   . Hypercholesteremia   . Osteoarthritis   . Chronic renal disease, stage II   . Atrial fibrillation (HCC)   . MR (mitral regurgitation)     , Mild    Past Surgical History  Procedure Laterality Date  . Back surgery    . Joint replacement      Current Outpatient Prescriptions  Medication Sig Dispense Refill  . apixaban (ELIQUIS) 5 MG TABS tablet Take 1 tablet (5 mg total) by mouth 2 (two) times daily. 180 tablet 2  . celecoxib (CELEBREX) 200 MG capsule Take 200 mg by mouth daily.      . cholecalciferol (VITAMIN D) 1000 UNITS tablet Take 1,000 Units by mouth daily.      . metoprolol succinate (TOPROL-XL) 25 MG 24 hr tablet  Take 0.5 tablets (12.5 mg total) by mouth daily. 45 tablet 2  . vitamin C (ASCORBIC ACID) 500 MG tablet Take 500 mg by mouth daily.       No current facility-administered medications for this visit.    Allergies:    Allergies  Allergen Reactions  . Darvocet [Propoxyphene N-Acetaminophen] Nausea And Vomiting  . Lodine [Etodolac]     Unknown to patient  . Meprozine [Meperidine-Promethazine]     Reaction=tongue swelling  . Percocet [Oxycodone-Acetaminophen]   . Ultracet  [Tramadol-Acetaminophen] Nausea And Vomiting  . Demerol Nausea And Vomiting  . Hydrocodone Rash  . Penicillins Rash    Social History:  The patient  reports that she has never smoked. She does not have any smokeless tobacco history on file. She reports that she does not drink alcohol or use illicit drugs.   ROS:  Please see the history of present illness.   Denies any fevers, chills, orthopnea, PND, bleeding. No falls.   PHYSICAL EXAM: VS:  BP 136/70 mmHg  Pulse 61  Ht 5' (1.524 m)  Wt 141 lb 3.2 oz (64.048 kg)  BMI 27.58 kg/m2 Well nourished, well developed, elderly in no acute distress HEENT: normal Neck: no JVD Cardiac:  normal S1, S2; RRR; no murmur Lungs:  clear to auscultation bilaterally, no wheezing, rhonchi or rales Abd: soft, nontender, no hepatomegaly Ext: no edema Skin: warm and dry Neuro: no focal abnormalities noted, no tremulousness mildly anxious  EKG: EKG ordered today-04/01/16-sinus rhythm, 60, PA-C, left axis deviation, septal infarct pattern. Personally viewed-no significant change from prior 04/03/15-sinus bradycardia with premature atrial complex, left anterior fascicular block-prior 03/09/14-sinus rhythm, premature atrial contractions, poor R wave progression     Labs: 01/12/16 - creatinine 1.1  ASSESSMENT AND PLAN:  1. Paroxysmal atrial fibrillation-on Eliquis. Checking blood work every 6 months. She is doing well. Dr. Pete Glatter checked blood work recently and it was reassuring.  Creatinine remains stable at 1.1.  Currently in sinus rhythm.Toprol to 12.5 mg.  2. Chronic anticoagulation-no changes made. Eliquis is currently 5 mg twice a day.  3. Left anterior fascicular block-stable. No high-risk symptoms such as syncope. 4. Essential hypertension- She states that usually at home it is in the 120.  Toprol to 12.5 (previously felt lightheaded but she is no longer feeling this). 5. Tremor-may not be related to atrial fibrillation. I noticed tremulousness  previously despite sinus rhythm. May be during situations of anxiety. 6. Mild mitral regurgitation-no clinical symptoms. 7. Grief-loss of her daughter with breast cancer at age 12. Improving. 8. 6 month follow up.  Signed, Donato Schultz, MD [facility]  04/01/2016 9:58 AM

## 2016-08-06 ENCOUNTER — Other Ambulatory Visit: Payer: Self-pay | Admitting: Cardiology

## 2016-08-23 ENCOUNTER — Other Ambulatory Visit: Payer: Self-pay | Admitting: Cardiology

## 2016-10-06 NOTE — Progress Notes (Signed)
[address], [address] [place], [place]  [address] Phone: [phone] Fax:  [phone]  Date:  10/07/2016   ID:  Jessica Berg, DOB 11-29-1924, MRN [id number]  PCP:  Ginette Otto, MD   History of Present Illness: Jessica Berg is a 82 y.o. female with event monitor demonstrating atrial fibrillation after multiple EKGs only demonstrated sinus rhythm with PACs.  Her main complaint previously was being tremulousness and feeling pale weak, with bilateral arm and leg numbness. She states that she no longer feels externally tremulous but at times feels internally tremulous, perhaps from atrial fibrillation. She is much improved since decreasing her Toprol from 50 down to 25. Did not tolerate diltiazem due to constipation.  Eliquis 04/18/13 She is doing better with less fatigue and dizziness greatly improved. Her tremors are also better controlled. No further constipation. No bleeding. Hemoglobin stable. Awaiting renal function.  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 Feels some racing of the heart. Not often. Toprol. When on 50mg  felt poor, dizzy  03/09/14-doing well. Normal rhythm. No palpitations. Great attitude.  09/09/14-overall, she is doing well, rare palpitations. No bleeding. No strokelike symptoms. No shortness of breath although she does feel transient need to take any deep breaths when she feels her palpitations. Unfortunately, her daughter age 38 who is a grandmother is well, has breast cancer, stage III. She is undergoing chemotherapy.  04/03/15-has been under increased stress with her other daughter who recently has been moving. No syncope. No chest pain. Every now and then she will feel this tremulousness episode when she takes a deep breath and it seems to go away.  10/02/15-had a bout of rash. There were more like excoriations on different parts of her body she states. This has resolved. She was concerned at one point that it may be [employer]. She was concerned about taking Benadryl with this. She has been under a lot of stress as above. Her daughter died from breast cancer. Her sister came to live with her transiently. Her sister has bad Parkinson's disease. She ended up having a fall, bruising the right side of her leg.  4/17-17-previously had a fall on her right side caused hematoma/ecchymosis. Now resolved. Minor staining she states. She's not had any further falls, no syncopal, no chest pain, no shortness of breath. No palpitations. Easter service was good for her.  10/07/16-overall doing well. She turned 82 years old. She will very rarely have a "sinking feeling ". This is transient. She has not had any frank syncope. No chest pain, no shortness of breath.  Wt Readings from Last 3 Encounters:  10/07/16 140 lb 12.8 oz (63.9 kg)  04/01/16 141 lb 3.2 oz (64 kg)  10/02/15 142 lb 9.6 oz (64.7 kg)     Past Medical History:  Diagnosis Date  . Atrial fibrillation (HCC)   . Chronic renal disease, stage II   . GERD (gastroesophageal reflux disease)   . Hypercholesteremia   . MR (mitral regurgitation)    , Mild  . Osteoarthritis   . Osteoporosis     Past Surgical History:  Procedure Laterality Date  . BACK SURGERY    . JOINT REPLACEMENT      Current Outpatient Prescriptions  Medication Sig Dispense Refill  . apixaban (ELIQUIS) 5 MG TABS tablet Take 1 tablet (5 mg total) by mouth 2 (two) times daily. 180 tablet 2  . celecoxib (CELEBREX) 200 MG capsule Take 200 mg by mouth daily.      10/04/15  cholecalciferol (VITAMIN D) 1000 UNITS tablet Take 1,000 Units by mouth daily.      Marland Kitchen gabapentin (NEURONTIN) 100 MG capsule Take 100 mg by mouth daily.    . metoprolol succinate (TOPROL-XL) 25 MG 24 hr tablet Take 0.5 tablets (12.5 mg total) by mouth daily. 45 tablet 1  . vitamin C (ASCORBIC ACID) 500 MG tablet Take 500 mg by mouth daily.       No current facility-administered medications for this visit.     Allergies:      Allergies  Allergen Reactions  . Darvocet [Propoxyphene N-Acetaminophen] Nausea And Vomiting  . Lodine [Etodolac]     Unknown to patient  . Meprozine [Meperidine-Promethazine]     Reaction=tongue swelling  . Percocet [Oxycodone-Acetaminophen]   . Ultracet [Tramadol-Acetaminophen] Nausea And Vomiting  . Demerol Nausea And Vomiting  . Hydrocodone Rash  . Penicillins Rash    Social History:  The patient  reports that she has never smoked. She does not have any smokeless tobacco history on file. She reports that she does not drink alcohol or use drugs.   ROS:  Please see the history of present illness.   Denies any fevers, chills, orthopnea, PND, bleeding. No falls.   PHYSICAL EXAM: VS:  BP 120/72   Pulse 76   Ht 5' (1.524 m)   Wt 140 lb 12.8 oz (63.9 kg)   BMI 27.50 kg/m  Well nourished, well developed, elderly in no acute distress  HEENT: normal  Neck: no JVD  Cardiac:  irreg; no murmur  Lungs:  clear to auscultation bilaterally, no wheezing, rhonchi or rales  Abd: soft, nontender, no hepatomegaly  Ext: no edema  Skin: warm and dry  Neuro: no focal abnormalities noted, no tremulousness mildly anxious  EKG: EKG -04/01/16-sinus rhythm, 60, PA-C, left axis deviation, septal infarct pattern. Personally viewed-no significant change from prior 04/03/15-sinus bradycardia with premature atrial complex, left anterior fascicular block-prior 03/09/14-sinus rhythm, premature atrial contractions, poor R wave progression     Labs: 01/12/16 - creatinine 1.1  ASSESSMENT AND PLAN:  1. Paroxysmal atrial fibrillation-on Eliquis. She sounds slightly irregular on exam today. Well rate controlled. Checking blood work every 6 months. She is doing well. Dr. Pete Glatter checked blood work recently and it was reassuring.  Creatinine remains stable at 1.1.  Currently in sinus rhythm.Toprol to 12.5 mg.  2. Chronic anticoagulation-no changes made. Eliquis is currently 5 mg twice a day.  3. Left anterior  fascicular block-stable. No high-risk symptoms such as syncope. 4. Essential hypertension- She states that usually at home it is in the 120.  Toprol to 12.5 (previously felt lightheaded but she is no longer feeling this). She does have the sinking feeling again, we will go ahead and place a 30 day event monitor on her to make sure she does not have any pauses or any other significant arrhythmias. 5. Tremor-may not be related to atrial fibrillation. I noticed tremulousness previously despite sinus rhythm. May be during situations of anxiety. 6. Mild mitral regurgitation-no clinical symptoms. 7. Grief-loss of her daughter with breast cancer at age 83. Improving. 8. 6 month follow up with APP, 12 with me.   Signed, Donato Schultz, MD [facility]  10/07/2016 10:32 AM

## 2016-10-07 ENCOUNTER — Encounter: Payer: Self-pay | Admitting: Cardiology

## 2016-10-07 ENCOUNTER — Ambulatory Visit (INDEPENDENT_AMBULATORY_CARE_PROVIDER_SITE_OTHER): Payer: Medicare Other | Admitting: Cardiology

## 2016-10-07 VITALS — BP 120/72 | HR 76 | Ht 60.0 in | Wt 140.8 lb

## 2016-10-07 DIAGNOSIS — I340 Nonrheumatic mitral (valve) insufficiency: Secondary | ICD-10-CM | POA: Diagnosis not present

## 2016-10-07 DIAGNOSIS — Z7901 Long term (current) use of anticoagulants: Secondary | ICD-10-CM | POA: Diagnosis not present

## 2016-10-07 DIAGNOSIS — I480 Paroxysmal atrial fibrillation: Secondary | ICD-10-CM | POA: Diagnosis not present

## 2016-10-07 DIAGNOSIS — E7800 Pure hypercholesterolemia, unspecified: Secondary | ICD-10-CM

## 2016-10-07 DIAGNOSIS — I444 Left anterior fascicular block: Secondary | ICD-10-CM

## 2016-10-07 NOTE — Patient Instructions (Signed)
Medication Instructions:  The current medical regimen is effective;  continue present plan and medications.  Follow-Up: Follow up in 6 months with Katy Thompson, PA.  You will receive a letter in the mail 2 months before you are due.  Please call us when you receive this letter to schedule your follow up appointment.  Follow up in 1 year with Dr. Skains.  You will receive a letter in the mail 2 months before you are due.  Please call us when you receive this letter to schedule your follow up appointment.  If you need a refill on your cardiac medications before your next appointment, please call your pharmacy.  Thank you for choosing [facility]!!     

## 2017-02-19 ENCOUNTER — Other Ambulatory Visit: Payer: Self-pay | Admitting: Cardiology

## 2017-03-21 ENCOUNTER — Encounter: Payer: Self-pay | Admitting: Physician Assistant

## 2017-04-07 NOTE — Progress Notes (Signed)
Cardiology Office Note    Date:  04/08/2017   ID:  Jessica Berg, DOB 28-Apr-1924, MRN [id number]  PCP:  Ginette Otto, MD  Cardiologist:  Dr. Anne Fu  CC: follow up   History of Present Illness:  Jessica Berg is a 83 y.o. female with a history of paroxysmal atrial fibrillation (dx'd on event monitor) on Eliquis, CKD, HLD, mild MR and LAFB who presents to clinic for follow up.   Her main complaint previously was being tremulousness and feeling pale weak, with bilateral arm and leg numbness. She states that she no longer feels externally tremulous but at times feels internally tremulous, perhaps from atrial fibrillation. She is much improved since decreasing her Toprol from 50 down to 25. Did not tolerate diltiazem due to constipation.  She was last seen by Dr. Anne Fu in 09/2016 and doing well. Reported a "sinking feeling" and there was discussion about a 30 day monitor, but I don't see that this was placed.   Today she presents to clinic for follow up. No CP or SOB. No LE edema, orthopnea or PND. No dizziness or syncope. No blood in stool or urine. No palpitations. She lives alone and drives. She is mostly limited by arthritis. She also has continued nerve pain.    Past Medical History:  Diagnosis Date  . Atrial fibrillation (HCC)   . Chronic renal disease, stage II   . GERD (gastroesophageal reflux disease)   . Hypercholesteremia   . MR (mitral regurgitation)    , Mild  . Osteoarthritis   . Osteoporosis     Past Surgical History:  Procedure Laterality Date  . BACK SURGERY    . JOINT REPLACEMENT      Current Medications: Outpatient Medications Prior to Visit  Medication Sig Dispense Refill  . apixaban (ELIQUIS) 5 MG TABS tablet Take 1 tablet (5 mg total) by mouth 2 (two) times daily. 180 tablet 2  . celecoxib (CELEBREX) 200 MG capsule Take 200 mg by mouth daily.      . cholecalciferol (VITAMIN D) 1000 UNITS tablet Take 1,000 Units by mouth daily.      Marland Kitchen  gabapentin (NEURONTIN) 100 MG capsule Take 100 mg by mouth daily.    . metoprolol succinate (TOPROL-XL) 25 MG 24 hr tablet TAKE ONE-HALF (1/2) TABLET DAILY 45 tablet 1  . vitamin C (ASCORBIC ACID) 500 MG tablet Take 500 mg by mouth daily.       No facility-administered medications prior to visit.      Allergies:   Darvocet [propoxyphene n-acetaminophen]; Lodine [etodolac]; Meprozine [meperidine-promethazine]; Percocet [oxycodone-acetaminophen]; Ultracet [tramadol-acetaminophen]; Demerol; Hydrocodone; and Penicillins   Social History   Social History  . Marital status: Widowed    Spouse name: N/A  . Number of children: N/A  . Years of education: N/A   Social History Main Topics  . Smoking status: Never Smoker  . Smokeless tobacco: Never Used  . Alcohol use No  . Drug use: No  . Sexual activity: Not Asked   Other Topics Concern  . None   Social History Narrative  . None     Family History:  The patient's family history includes Arthritis in her mother; Heart attack in her father; Hypothyroidism in her mother.     ROS:   Please see the history of present illness.    ROS All other systems reviewed and are negative.   PHYSICAL EXAM:   VS:  BP 124/82   Pulse 66   Ht 5' (1.524 m)  Wt 142 lb (64.4 kg)   SpO2 96%   BMI 27.73 kg/m    GEN: Well nourished, well developed, in no acute distress, appears younger than stated age HEENT: normal  Neck: no JVD, carotid bruits, or masses Cardiac: RRR; no murmurs, rubs, or gallops,no edema  Respiratory:  clear to auscultation bilaterally, normal work of breathing GI: soft, nontender, nondistended, + BS MS: no deformity or atrophy  Skin: warm and dry, no rash Neuro:  Alert and Oriented x 3, Strength and sensation are intact Psych: euthymic mood, full affect    Wt Readings from Last 3 Encounters:  04/08/17 142 lb (64.4 kg)  10/07/16 140 lb 12.8 oz (63.9 kg)  04/01/16 141 lb 3.2 oz (64 kg)      Studies/Labs Reviewed:    EKG:  EKG is ordered today.  The ekg ordered today demonstrates sinus with PAC, HR 63   Recent Labs: No results found for requested labs within last 8760 hours.   Lipid Panel No results found for: CHOL, TRIG, HDL, CHOLHDL, VLDL, LDLCALC, LDLDIRECT  Additional studies/ records that were reviewed today include:  none   ASSESSMENT & PLAN:   PAF: maintaining NSR today. Continue Eliquis 5mg  BID. (>80, >60 kg, creat <1.4) for CHADSVASC of at least 4 (HTN, age, F sex). Continue Toprol XL 12.5mg  daily. Labs recently checked by Dr last week and reportedly good. Will ask that these be faxed to Pete Glatter.   LAFB: stable.   HTN: BP well controlled today   Mild MR: stable.     Medication Adjustments/Labs and Tests Ordered: Current medicines are reviewed at length with the patient today.  Concerns regarding medicines are outlined above.  Medication changes, Labs and Tests ordered today are listed in the Patient Instructions below. Patient Instructions  Medication Instructions:  Your physician recommends that you continue on your current medications as directed. Please refer to the Current Medication list given to you today.   Labwork: None ordered  Testing/Procedures: None ordered  Follow-Up: Your physician wants you to follow-up in: 6 MONTHS WITH DR. [place]   You will receive a reminder letter in the mail two months in advance. If you don't receive a letter, please call our office to schedule the follow-up appointment.   Any Other Special Instructions Will Be Listed Below (If Applicable).     If you need a refill on your cardiac medications before your next appointment, please call your pharmacy.      Signed, Anne Fu, PA-C  04/08/2017 10:29 AM    [facility] [address] [place], [place], [place]  [place] Phone: [phone]; Fax: [phone]

## 2017-04-08 ENCOUNTER — Encounter: Payer: Self-pay | Admitting: Physician Assistant

## 2017-04-08 ENCOUNTER — Encounter (INDEPENDENT_AMBULATORY_CARE_PROVIDER_SITE_OTHER): Payer: Self-pay

## 2017-04-08 ENCOUNTER — Ambulatory Visit (INDEPENDENT_AMBULATORY_CARE_PROVIDER_SITE_OTHER): Payer: Medicare Other | Admitting: Physician Assistant

## 2017-04-08 VITALS — BP 124/82 | HR 66 | Ht 60.0 in | Wt 142.0 lb

## 2017-04-08 DIAGNOSIS — I10 Essential (primary) hypertension: Secondary | ICD-10-CM | POA: Diagnosis not present

## 2017-04-08 DIAGNOSIS — I340 Nonrheumatic mitral (valve) insufficiency: Secondary | ICD-10-CM

## 2017-04-08 DIAGNOSIS — Z7901 Long term (current) use of anticoagulants: Secondary | ICD-10-CM

## 2017-04-08 DIAGNOSIS — I444 Left anterior fascicular block: Secondary | ICD-10-CM

## 2017-04-08 DIAGNOSIS — I480 Paroxysmal atrial fibrillation: Secondary | ICD-10-CM | POA: Diagnosis not present

## 2017-04-08 DIAGNOSIS — E7800 Pure hypercholesterolemia, unspecified: Secondary | ICD-10-CM

## 2017-04-08 NOTE — Patient Instructions (Signed)

## 2017-05-03 ENCOUNTER — Other Ambulatory Visit: Payer: Self-pay | Admitting: Cardiology

## 2017-05-05 NOTE — Telephone Encounter (Signed)
Lab results received from Dr Laverle Hobby office Creat 1.14 on 04/01/17.  Pt is on appropriate dosage of Eliquis 5mg  BID.

## 2017-05-05 NOTE — Telephone Encounter (Signed)
Pt last saw Cline Crock, PA on 04/08/17, but last labs in chart are from 2015.  Pt weighs 64.4kg, age 83 y/o, need recent labwork.   Called spoke with pt, pt states she had labwork drawn at Dr Laverle Hobby office on 04/01/17, called Dr Lesia Sago office [phone] [facility] for [place] his nurse requesting CBC and BMP be faxed to our office.

## 2017-08-18 ENCOUNTER — Other Ambulatory Visit: Payer: Self-pay | Admitting: Cardiology

## 2017-10-01 ENCOUNTER — Ambulatory Visit
Admission: RE | Admit: 2017-10-01 | Discharge: 2017-10-01 | Disposition: A | Discharge status: Home / Self Care | Payer: Medicare Other | Source: Ambulatory Visit | Attending: Geriatric Medicine | Admitting: Geriatric Medicine

## 2017-10-01 ENCOUNTER — Other Ambulatory Visit: Payer: Self-pay | Admitting: Geriatric Medicine

## 2017-10-01 DIAGNOSIS — M542 Cervicalgia: Secondary | ICD-10-CM

## 2017-10-09 ENCOUNTER — Ambulatory Visit: Payer: Medicare Other | Admitting: Cardiology

## 2017-10-30 ENCOUNTER — Other Ambulatory Visit: Payer: Self-pay | Admitting: Cardiology

## 2017-10-30 NOTE — Telephone Encounter (Signed)
Pt last saw Cline Crock, PA on 04/08/17, last labs 04/01/17 Creat 1.14, weight 64.4kg, age 83, based on specified criteria pt is on appropriate dosage of Eliquis 5mg  BID.  Will refill rx.

## 2017-12-24 ENCOUNTER — Encounter (INDEPENDENT_AMBULATORY_CARE_PROVIDER_SITE_OTHER): Payer: Self-pay

## 2017-12-24 ENCOUNTER — Ambulatory Visit (INDEPENDENT_AMBULATORY_CARE_PROVIDER_SITE_OTHER): Payer: Medicare Other | Admitting: Cardiology

## 2017-12-24 ENCOUNTER — Encounter: Payer: Self-pay | Admitting: Cardiology

## 2017-12-24 VITALS — BP 140/86 | HR 88 | Ht 60.0 in | Wt 138.1 lb

## 2017-12-24 DIAGNOSIS — I480 Paroxysmal atrial fibrillation: Secondary | ICD-10-CM

## 2017-12-24 DIAGNOSIS — Z7901 Long term (current) use of anticoagulants: Secondary | ICD-10-CM

## 2017-12-24 DIAGNOSIS — I10 Essential (primary) hypertension: Secondary | ICD-10-CM

## 2017-12-24 DIAGNOSIS — I340 Nonrheumatic mitral (valve) insufficiency: Secondary | ICD-10-CM | POA: Diagnosis not present

## 2017-12-24 NOTE — Patient Instructions (Signed)
Medication Instructions:  The current medical regimen is effective;  continue present plan and medications.  Follow-Up: Follow up in 6 months with Lori Gerhardt, NP or Laura Ingold, NP.  You will receive a letter in the mail 2 months before you are due.  Please call us when you receive this letter to schedule your follow up appointment.  Follow up in 1 year with Dr. Skains.  You will receive a letter in the mail 2 months before you are due.  Please call us when you receive this letter to schedule your follow up appointment.  If you need a refill on your cardiac medications before your next appointment, please call your pharmacy.  Thank you for choosing  [facility]!!     

## 2017-12-24 NOTE — Progress Notes (Signed)
Cardiology Office Note:    Date:  12/24/2017   ID:  Jessica Berg, DOB 01-02-1924, MRN [id number]  PCP:  Merlene Laughter, MD  Cardiologist:  No primary care provider on file.   Referring MD: Merlene Laughter, MD     History of Present Illness:    Jessica Berg is a 84 y.o. female with paroxysmal atrial fibrillation previously diagnosed on event monitor on anticoagulation with Eliquis, chronic kidney disease, hyperlipidemia, mild mitral regurgitation and left anterior fascicular block here for follow-up.  Previously felt tremulous, pale, weak, bilateral arm and leg numbness.  Shaky.  Did not tolerate diltiazem secondary to constipation.  One intervention in the past was to decrease her Toprol from 50 down to 25 and she felt much better.  Every now and then she may have a sinking feeling.  PT - going to PT for neck/back. Arthritis. Dr. Pete Glatter. Could not turn head. Tremor right hand. Sister 58 has Parkinsons. She was reassured that it was not PD.   12/24/17 - in AFIB today. Sometimes feels feels irregular heartbeat.  No syncope, no bleeding.  Past Medical History:  Diagnosis Date  . Atrial fibrillation (HCC)   . Chronic renal disease, stage II   . GERD (gastroesophageal reflux disease)   . Hypercholesteremia   . MR (mitral regurgitation)    , Mild  . Osteoarthritis   . Osteoporosis     Past Surgical History:  Procedure Laterality Date  . BACK SURGERY    . JOINT REPLACEMENT      Current Medications: Current Meds  Medication Sig  . celecoxib (CELEBREX) 200 MG capsule Take 200 mg by mouth daily.    . cholecalciferol (VITAMIN D) 1000 UNITS tablet Take 1,000 Units by mouth daily.    Marland Kitchen ELIQUIS 5 MG TABS tablet TAKE 1 TABLET TWICE A DAY  . metoprolol succinate (TOPROL-XL) 25 MG 24 hr tablet Take 0.5 tablets (12.5 mg total) by mouth daily.  . vitamin C (ASCORBIC ACID) 500 MG tablet Take 500 mg by mouth daily.       Allergies:   Darvocet [propoxyphene n-acetaminophen];  Lodine [etodolac]; Meprozine [meperidine-promethazine]; Percocet [oxycodone-acetaminophen]; Ultracet [tramadol-acetaminophen]; Demerol; Hydrocodone; and Penicillins   Social History   Socioeconomic History  . Marital status: Widowed    Spouse name: None  . Number of children: None  . Years of education: None  . Highest education level: None  Social Needs  . Financial resource strain: None  . Food insecurity - worry: None  . Food insecurity - inability: None  . Transportation needs - medical: None  . Transportation needs - non-medical: None  Occupational History  . None  Tobacco Use  . Smoking status: Never Smoker  . Smokeless tobacco: Never Used  Substance and Sexual Activity  . Alcohol use: No  . Drug use: No  . Sexual activity: None  Other Topics Concern  . None  Social History Narrative  . None     Family History: The patient's family history includes Arthritis in her mother; Heart attack in her father; Hypothyroidism in her mother.  ROS:   Please see the history of present illness.     All other systems reviewed and are negative.  EKGs/Labs/Other Studies Reviewed:    The following studies were reviewed today: Prior office notes, lab work, EKG reviewed  EKG: Today's EKG 12/24/17 shows atrial fibrillation heart rate 88 bpm with poor R wave progression, left anterior fascicular block, PVC.  Personally viewed-prior EKG shows sinus rhythm PACs, heart  rate 63 back on 04/08/17 personally viewed.  Recent Labs: No results found for requested labs within last 8760 hours.  Recent Lipid Panel No results found for: CHOL, TRIG, HDL, CHOLHDL, VLDL, LDLCALC, LDLDIRECT  Physical Exam:    VS:  BP 140/86   Pulse 88   Ht 5' (1.524 m)   Wt 138 lb 1.9 oz (62.7 kg)   SpO2 96%   BMI 26.97 kg/m     Wt Readings from Last 3 Encounters:  12/24/17 138 lb 1.9 oz (62.7 kg)  04/08/17 142 lb (64.4 kg)  10/07/16 140 lb 12.8 oz (63.9 kg)     GEN: Elderly, well developed in no acute  distress HEENT: Normal NECK: No JVD; No carotid bruits LYMPHATICS: No lymphadenopathy CARDIAC: irreg, no murmurs, rubs, gallops RESPIRATORY:  Clear to auscultation without rales, wheezing or rhonchi  ABDOMEN: Soft, non-tender, non-distended MUSCULOSKELETAL:  No edema; No deformity  SKIN: Warm and dry NEUROLOGIC:  Alert and oriented x 3 PSYCHIATRIC:  Normal affect   ASSESSMENT:    1. Paroxysmal atrial fibrillation (HCC)   2. Non-rheumatic mitral regurgitation   3. Chronic anticoagulation   4. Essential hypertension    PLAN:    In order of problems listed above:  Paroxysmal atrial fibrillation -Sinus rhythm on prior visit, Rate controlled AFIB today. Eliquis, score of 4, low-dose Toprol now at 12.5 daily, previous labs excellent.   Left anterior fascicular block -Stable, if syncope occurs, event monitor. She is not too eager about having another monitor if needed.   Essential hypertension -Continue with blood pressure control, stable. Willing to tolerate a slightly higher BP given her occasional wooziness.   Mild mitral regurgitation -Stable.   Medication Adjustments/Labs and Tests Ordered: Current medicines are reviewed at length with the patient today.  Concerns regarding medicines are outlined above.  Orders Placed This Encounter  Procedures  . EKG 12-Lead   No orders of the defined types were placed in this encounter.   Signed, Donato Schultz, MD  12/24/2017 9:47 AM    [facility]

## 2018-02-16 ENCOUNTER — Other Ambulatory Visit: Payer: Self-pay | Admitting: Cardiology

## 2018-04-28 ENCOUNTER — Other Ambulatory Visit: Payer: Self-pay | Admitting: Cardiology

## 2018-04-28 NOTE — Telephone Encounter (Signed)
Pt is a 84 yr old female who last saw Dr Anne Fu  On 12/24/17. Her weight at that visit was 62.7Kg. Last SCr was 1.14 from 04/01/17. Pt has pending appt in July will have labs repeated. Thus, will give 3 mth supply of Eliquis 5mg  BID.

## 2018-07-01 ENCOUNTER — Ambulatory Visit: Payer: Medicare Other | Admitting: Nurse Practitioner

## 2018-07-06 ENCOUNTER — Ambulatory Visit (INDEPENDENT_AMBULATORY_CARE_PROVIDER_SITE_OTHER): Payer: Medicare Other | Admitting: Nurse Practitioner

## 2018-07-06 ENCOUNTER — Encounter: Payer: Self-pay | Admitting: Nurse Practitioner

## 2018-07-06 VITALS — BP 128/90 | HR 88 | Ht 60.0 in | Wt 139.4 lb

## 2018-07-06 DIAGNOSIS — I480 Paroxysmal atrial fibrillation: Secondary | ICD-10-CM

## 2018-07-06 DIAGNOSIS — Z7901 Long term (current) use of anticoagulants: Secondary | ICD-10-CM

## 2018-07-06 DIAGNOSIS — I481 Persistent atrial fibrillation: Secondary | ICD-10-CM

## 2018-07-06 DIAGNOSIS — I10 Essential (primary) hypertension: Secondary | ICD-10-CM

## 2018-07-06 DIAGNOSIS — I4819 Other persistent atrial fibrillation: Secondary | ICD-10-CM

## 2018-07-06 LAB — CBC
Hematocrit: 38.5 % (ref 34.0–46.6)
Hemoglobin: 12.9 g/dL (ref 11.1–15.9)
MCH: 31 pg (ref 26.6–33.0)
MCHC: 33.5 g/dL (ref 31.5–35.7)
MCV: 93 fL (ref 79–97)
Platelets: 192 10*3/uL (ref 150–450)
RBC: 4.16 x10E6/uL (ref 3.77–5.28)
RDW: 12.9 % (ref 12.3–15.4)
WBC: 6.3 10*3/uL (ref 3.4–10.8)

## 2018-07-06 LAB — BASIC METABOLIC PANEL
BUN/Creatinine Ratio: 20 (ref 12–28)
BUN: 26 mg/dL (ref 10–36)
CO2: 25 mmol/L (ref 20–29)
Calcium: 10.1 mg/dL (ref 8.7–10.3)
Chloride: 102 mmol/L (ref 96–106)
Creatinine, Ser: 1.27 mg/dL — ABNORMAL HIGH (ref 0.57–1.00)
GFR calc Af Amer: 42 mL/min/{1.73_m2} — ABNORMAL LOW (ref >59)
GFR calc non Af Amer: 37 mL/min/{1.73_m2} — ABNORMAL LOW (ref >59)
Glucose: 81 mg/dL (ref 65–99)
Potassium: 4.8 mmol/L (ref 3.5–5.2)
Sodium: 143 mmol/L (ref 134–144)

## 2018-07-06 NOTE — Progress Notes (Signed)
CARDIOLOGY OFFICE NOTE  Date:  07/06/2018    [name] Date of Birth: 03/20/1924 Medical Record #[id number]  PCP:  Merlene Laughter, MD  Cardiologist:  [facility]   Chief Complaint  Patient presents with  . Atrial Fibrillation    6 month check - Seen for Dr. Anne Fu    History of Present Illness: Jessica Berg is a 84 y.o. female who presents today for a 6 month check. Seen for Dr. Anne Fu.   She has a history of PAF, chronic anticoagulation, CKD, HLD, mild MR and LAFB on EKG.   Last seen in January. Was in AF at that visit.   Comes in today. Here with her daughter. She continues to do very well. No chest pain. Breathing is good. No syncope. Very little dizziness. Did have a fall several months ago - was coming out of the beauty shop - the concrete was uneven and she miss stepped. She did hit her head - saw PCP. No sequale. She remains pretty active. Still quite independent.   Past Medical History:  Diagnosis Date  . Atrial fibrillation (HCC)   . Chronic renal disease, stage II   . GERD (gastroesophageal reflux disease)   . Hypercholesteremia   . MR (mitral regurgitation)    , Mild  . Osteoarthritis   . Osteoporosis     Past Surgical History:  Procedure Laterality Date  . BACK SURGERY    . JOINT REPLACEMENT       Medications: Current Meds  Medication Sig  . celecoxib (CELEBREX) 200 MG capsule Take 200 mg by mouth daily.    . cholecalciferol (VITAMIN D) 1000 UNITS tablet Take 1,000 Units by mouth daily.    Marland Kitchen ELIQUIS 5 MG TABS tablet TAKE 1 TABLET TWICE A DAY  . metoprolol succinate (TOPROL-XL) 25 MG 24 hr tablet Take 12.5 mg by mouth daily.  . vitamin C (ASCORBIC ACID) 500 MG tablet Take 500 mg by mouth daily.    . [DISCONTINUED] metoprolol succinate (TOPROL-XL) 25 MG 24 hr tablet TAKE ONE-HALF (1/2) TABLET DAILY (Patient taking differently: TAKE ONE-HALF (1/2) TABLET by mouth DAILY)     Allergies: Allergies  Allergen Reactions  . Darvocet  [Propoxyphene N-Acetaminophen] Nausea And Vomiting  . Lodine [Etodolac]     Unknown to patient  . Meprozine [Meperidine-Promethazine]     Reaction=tongue swelling  . Percocet [Oxycodone-Acetaminophen]   . Ultracet [Tramadol-Acetaminophen] Nausea And Vomiting  . Demerol Nausea And Vomiting  . Hydrocodone Rash  . Penicillins Rash    Social History: The patient  reports that she has never smoked. She has never used smokeless tobacco. She reports that she does not drink alcohol or use drugs.   Family History: The patient's family history includes Arthritis in her mother; Heart attack in her father; Hypothyroidism in her mother.   Review of Systems: Please see the history of present illness.   Otherwise, the review of systems is positive for none.   All other systems are reviewed and negative.   Physical Exam: VS:  BP 128/90 (BP Location: Left Arm, Patient Position: Sitting, Cuff Size: Normal)   Pulse 88   Ht 5' (1.524 m)   Wt 139 lb 6.4 oz (63.2 kg)   SpO2 98% Comment: at rest  BMI 27.22 kg/m  .  BMI Body mass index is 27.22 kg/m.  Wt Readings from Last 3 Encounters:  07/06/18 139 lb 6.4 oz (63.2 kg)  12/24/17 138 lb 1.9 oz (62.7 kg)  04/08/17 142  lb (64.4 kg)    General: Pleasant. Elderly. Alert and in no acute distress.   HEENT: Normal.  Neck: Supple, no JVD, carotid bruits, or masses noted.  Cardiac: Irregular irregular rhythm. Her rate is fine.  No edema.  Respiratory:  Lungs are clear to auscultation bilaterally with normal work of breathing.  GI: Soft and nontender.  MS: No deformity or atrophy. Gait and ROM intact.  Skin: Warm and dry. Color is normal.  Neuro:  Strength and sensation are intact and no gross focal deficits noted.  Psych: Alert, appropriate and with normal affect.   LABORATORY DATA:  EKG:  EKG is not ordered today.  Lab Results  Component Value Date   WBC 6.0 11/15/2014   HGB 11.6 (L) 11/15/2014   HCT 35.8 (L) 11/15/2014   PLT 175.0  11/15/2014   GLUCOSE 86 09/09/2014   ALT 19 11/23/2011   AST 23 11/23/2011   NA 137 09/09/2014   K 4.4 09/09/2014   CL 104 09/09/2014   CREATININE 1.1 09/09/2014   BUN 28 (H) 09/09/2014   CO2 29 09/09/2014       BNP (last 3 results) No results for input(s): BNP in the last 8760 hours.  ProBNP (last 3 results) No results for input(s): PROBNP in the last 8760 hours.   Other Studies Reviewed Today:   Assessment/Plan:  1. PAF - now persistent AF - managed with rate control and continued anticoagulation. She is doing well. No changes made today.   2. LAFB - no reports of syncope.   3. HTN - BP ok on current regimen.   4. Chronic anticoagulation -  She is on Eliquis - needs labs today - currently on full dose but will need to watch going forward - she is currently > 60 kg.   5. Mild MR - would follow.   6. Advanced age - does pretty well.   Current medicines are reviewed with the patient today.  The patient does not have concerns regarding medicines other than what has been noted above.  The following changes have been made:  See above.  Labs/ tests ordered today include:    Orders Placed This Encounter  Procedures  . Basic metabolic panel  . CBC     Disposition:   FU with Dr. Anne Fu in 6 months. I will be happy to see back as needed.    Patient is agreeable to this plan and will call if any problems develop in the interim.   SignedNorma Fredrickson, NP  07/06/2018 11:07 AM  [facility] [address] [place], [place]  [address] Phone: [phone] Fax: [phone]

## 2018-07-06 NOTE — Patient Instructions (Addendum)
We will be checking the following labs today - BMET and CBC   Medication Instructions:    Continue with your current medicines.     Testing/Procedures To Be Arranged:  N/A  Follow-Up:   See Dr. Anne Fu in 6 months    Other Special Instructions:   Keep up the good work!    If you need a refill on your cardiac medications before your next appointment, please call your pharmacy.   Call the [facility] office at [phone] if you have any questions, problems or concerns.

## 2018-07-27 ENCOUNTER — Other Ambulatory Visit: Payer: Self-pay | Admitting: Cardiology

## 2018-07-27 NOTE — Telephone Encounter (Signed)
Pt last saw Norma Fredrickson 07/06/18, last labs 07/06/18 Creat 1.27, age 84, weight 63.2kg, based on specified crt=iteria pt is on appropriate dosage of Elqiuis 5mg  BID.  Will refill rx.

## 2018-12-21 ENCOUNTER — Other Ambulatory Visit: Payer: Self-pay | Admitting: Geriatric Medicine

## 2018-12-21 DIAGNOSIS — H532 Diplopia: Secondary | ICD-10-CM

## 2018-12-21 NOTE — Progress Notes (Signed)
Cardiology Office Note:    Date:  12/22/2018   ID:  Jessica Berg, DOB 10-Jun-1924, MRN [id number]  PCP:  Merlene Laughter, MD  Cardiologist:  No primary care provider on file.  Electrophysiologist:  None   Referring MD: Merlene Laughter, MD     History of Present Illness:    Jessica Berg is a 85 y.o. female here for follow up of PAF.   AF was noted 12/2017.  Once again, she is in atrial fibrillation on today's EKG but rate controlled on Toprol.  She had a visit with her eye physician, who noticed left eye ptosis.  Dr. Pete Glatter has seen her and has ordered an MRI.  No reason to change Eliquis.  Continue.  Denies any chest pain, shortness of breath, fevers chills nausea vomiting syncope.  Past Medical History:  Diagnosis Date  . Atrial fibrillation (HCC)   . Chronic renal disease, stage II   . GERD (gastroesophageal reflux disease)   . Hypercholesteremia   . MR (mitral regurgitation)    , Mild  . Osteoarthritis   . Osteoporosis     Past Surgical History:  Procedure Laterality Date  . BACK SURGERY    . JOINT REPLACEMENT      Current Medications: Current Meds  Medication Sig  . apixaban (ELIQUIS) 5 MG TABS tablet Take 5 mg by mouth 2 (two) times daily.  . Calcium Carbonate-Vitamin D3 (CALCIUM 600-D) 600-400 MG-UNIT TABS Take 1 tablet by mouth daily.  . celecoxib (CELEBREX) 200 MG capsule Take 200 mg by mouth daily.    . cholecalciferol (VITAMIN D) 1000 UNITS tablet Take 1,000 Units by mouth daily.    . metoprolol succinate (TOPROL-XL) 25 MG 24 hr tablet Take 12.5 mg by mouth daily.  . vitamin C (ASCORBIC ACID) 500 MG tablet Take 500 mg by mouth daily.       Allergies:   Darvocet [propoxyphene n-acetaminophen]; Lodine [etodolac]; Meprozine [meperidine-promethazine]; Percocet [oxycodone-acetaminophen]; Ultracet [tramadol-acetaminophen]; Demerol; Hydrocodone; and Penicillins   Social History   Socioeconomic History  . Marital status: Widowed    Spouse name: Not on  file  . Number of children: Not on file  . Years of education: Not on file  . Highest education level: Not on file  Occupational History  . Not on file  Social Needs  . Financial resource strain: Not on file  . Food insecurity:    Worry: Not on file    Inability: Not on file  . Transportation needs:    Medical: Not on file    Non-medical: Not on file  Tobacco Use  . Smoking status: Never Smoker  . Smokeless tobacco: Never Used  Substance and Sexual Activity  . Alcohol use: No  . Drug use: No  . Sexual activity: Not on file  Lifestyle  . Physical activity:    Days per week: Not on file    Minutes per session: Not on file  . Stress: Not on file  Relationships  . Social connections:    Talks on phone: Not on file    Gets together: Not on file    Attends religious service: Not on file    Active member of club or organization: Not on file    Attends meetings of clubs or organizations: Not on file    Relationship status: Not on file  Other Topics Concern  . Not on file  Social History Narrative  . Not on file     Family History: The patient's family history  includes Arthritis in her mother; Heart attack in her father; Hypothyroidism in her mother.  ROS:   Please see the history of present illness.    No fevers chills nausea vomiting syncope.  Has hearing loss back pain dizziness all other systems reviewed and are negative.  EKGs/Labs/Other Studies Reviewed:    The following studies were reviewed today: Prior office notes reviewed  EKG:  EKG is ordered today.  The ekg ordered today demonstrates atrial fibrillation 82 left axis deviation, personally reviewed and interpreted  Recent Labs: 07/06/2018: BUN 26; Creatinine, Ser 1.27; Hemoglobin 12.9; Platelets 192; Potassium 4.8; Sodium 143  Recent Lipid Panel No results found for: CHOL, TRIG, HDL, CHOLHDL, VLDL, LDLCALC, LDLDIRECT  Physical Exam:    VS:  BP 126/78   Pulse 82   Ht 5' (1.524 m)   Wt 138 lb 6.4 oz  (62.8 kg)   SpO2 98%   BMI 27.03 kg/m      Wt Readings from Last 3 Encounters:  12/22/18 138 lb 6.4 oz (62.8 kg)  07/06/18 139 lb 6.4 oz (63.2 kg)  12/24/17 138 lb 1.9 oz (62.7 kg)     GEN:  Well nourished, well developed in no acute distress HEENT: Left eye ptosis NECK: No JVD; No carotid bruits LYMPHATICS: No lymphadenopathy CARDIAC: Irregularly irregular, no murmurs, rubs, gallops RESPIRATORY:  Clear to auscultation without rales, wheezing or rhonchi  ABDOMEN: Soft, non-tender, non-distended MUSCULOSKELETAL: No edema no deformity  SKIN: Warm and dry NEUROLOGIC:  Alert and oriented x 3 PSYCHIATRIC:  Normal affect pleasant  ASSESSMENT:    1. Paroxysmal atrial fibrillation (HCC)   2. Chronic anticoagulation   3. Hypercholesteremia   4. Left anterior fascicular block   5. Essential hypertension   6. Mitral valve insufficiency, unspecified etiology   7. Eyelash ptosis of left eye    PLAN:    In order of problems listed above:  Persistent atrial fibrillation -Sinus rhythm on prior visit, Rate controlled AFIB today once again. Eliquis, score of 4, continue Toprol, previous labs excellent.   Chronic anticoagulation -Eliquis 5 mg twice a day is appropriate dose.  62.8 kg, creatinine 1.2  left anterior fascicular block -Stable, if syncope occurs, event monitor. She is not too eager about having another monitor if needed.   No changes made.  Essential hypertension -Continue with blood pressure control, stable. Willing to tolerate a slightly higher BP given her occasional wooziness.   Overall doing quite well.  Mild mitral regurgitation -Stable.  Left eye ptosis - Dr. Dione Booze, Dr. Pete Glatter, MRI   Medication Adjustments/Labs and Tests Ordered: Current medicines are reviewed at length with the patient today.  Concerns regarding medicines are outlined above.  Orders Placed This Encounter  Procedures  . EKG 12-Lead   No orders of the defined types were placed in this  encounter.   Patient Instructions  Medication Instructions:  The current medical regimen is effective;  continue present plan and medications.  If you need a refill on your cardiac medications before your next appointment, please call your pharmacy.   Follow-Up: At [facility], you and your health needs are our priority.  As part of our continuing mission to provide you with exceptional heart care, we have created designated Provider Care Teams.  These Care Teams include your primary Cardiologist (physician) and Advanced Practice Providers (APPs -  Physician Assistants and Nurse Practitioners) who all work together to provide you with the care you need, when you need it. You will need a follow up  appointment in 6 months with Norma FredricksonLori Gerhardt, NP and 12 months with Dr Donato SchultzMark .  Please call our office 2 months in advance to schedule this appointment.  You may see Dr Donato SchultzMark . or one of the following Advanced Practice Providers on your designated Care Team:   Norma FredricksonLori Gerhardt, NP Nada BoozerLaura Ingold, NP . Georgie ChardJill McDaniel, NP  Thank you for choosing [facility]!!         Signed, Donato SchultzMark , MD  12/22/2018 9:58 AM    [facility]

## 2018-12-22 ENCOUNTER — Encounter: Payer: Self-pay | Admitting: Cardiology

## 2018-12-22 ENCOUNTER — Ambulatory Visit (INDEPENDENT_AMBULATORY_CARE_PROVIDER_SITE_OTHER): Payer: Medicare Other | Admitting: Cardiology

## 2018-12-22 VITALS — BP 126/78 | HR 82 | Ht 60.0 in | Wt 138.4 lb

## 2018-12-22 DIAGNOSIS — I480 Paroxysmal atrial fibrillation: Secondary | ICD-10-CM

## 2018-12-22 DIAGNOSIS — I10 Essential (primary) hypertension: Secondary | ICD-10-CM

## 2018-12-22 DIAGNOSIS — I444 Left anterior fascicular block: Secondary | ICD-10-CM

## 2018-12-22 DIAGNOSIS — H02402 Unspecified ptosis of left eyelid: Secondary | ICD-10-CM

## 2018-12-22 DIAGNOSIS — Z7901 Long term (current) use of anticoagulants: Secondary | ICD-10-CM

## 2018-12-22 DIAGNOSIS — I340 Nonrheumatic mitral (valve) insufficiency: Secondary | ICD-10-CM

## 2018-12-22 DIAGNOSIS — E7800 Pure hypercholesterolemia, unspecified: Secondary | ICD-10-CM | POA: Diagnosis not present

## 2018-12-22 NOTE — Patient Instructions (Signed)
Medication Instructions:  The current medical regimen is effective;  continue present plan and medications.  If you need a refill on your cardiac medications before your next appointment, please call your pharmacy.   Follow-Up: At [facility], you and your health needs are our priority.  As part of our continuing mission to provide you with exceptional heart care, we have created designated Provider Care Teams.  These Care Teams include your primary Cardiologist (physician) and Advanced Practice Providers (APPs -  Physician Assistants and Nurse Practitioners) who all work together to provide you with the care you need, when you need it. You will need a follow up appointment in 6 months with Norma Fredrickson, NP and 12 months with Dr Donato Schultz.  Please call our office 2 months in advance to schedule this appointment.  You may see Dr Donato Schultz. or one of the following Advanced Practice Providers on your designated Care Team:   Norma Fredrickson, NP Nada Boozer, NP . Georgie Chard, NP  Thank you for choosing [facility]!!

## 2018-12-24 ENCOUNTER — Ambulatory Visit: Payer: Medicare Other | Admitting: Cardiology

## 2018-12-24 ENCOUNTER — Other Ambulatory Visit: Payer: Self-pay | Admitting: Geriatric Medicine

## 2018-12-26 ENCOUNTER — Ambulatory Visit
Admission: RE | Admit: 2018-12-26 | Discharge: 2018-12-26 | Disposition: A | Discharge status: Home / Self Care | Payer: Medicare Other | Source: Ambulatory Visit | Attending: Geriatric Medicine | Admitting: Geriatric Medicine

## 2018-12-26 DIAGNOSIS — H532 Diplopia: Secondary | ICD-10-CM

## 2018-12-26 MED ORDER — GADOBENATE DIMEGLUMINE 529 MG/ML IV SOLN
12.0000 mL | Freq: Once | INTRAVENOUS | Status: AC | PRN
  Administered 2018-12-26: 12 mL via INTRAVENOUS

## 2019-02-11 ENCOUNTER — Other Ambulatory Visit: Payer: Self-pay | Admitting: Cardiology

## 2019-04-23 ENCOUNTER — Other Ambulatory Visit: Payer: Self-pay | Admitting: Cardiology

## 2019-04-23 NOTE — Telephone Encounter (Signed)
Last OV 12/22/2018 85 years old 62.8kg Scr 1.27 on 07/06/18 eliquis 5mg  BID sent to pharmacy

## 2019-04-26 ENCOUNTER — Other Ambulatory Visit: Payer: Self-pay | Admitting: Cardiology

## 2019-04-26 NOTE — Telephone Encounter (Signed)
Called pt to inform her that I called her pharmacy [facility] and informed me that pt has refills on these medications and are waiting to be processed. I advised the pt that if she has any other problems, questions or concerns to call the office. Pt verbalized understanding.

## 2019-04-26 NOTE — Telephone Encounter (Signed)
°*  STAT* If patient is at the pharmacy, call can be transferred to refill team.   1. Which medications need to be refilled? (please list name of each medication and dose if known) Eliquis and Metoprolol Succinate  2. Which pharmacy/location (including street and city if local pharmacy) is medication to be sent to? [facility] 3. Do they need a 30 day or 90 day supply? 90

## 2019-06-28 ENCOUNTER — Telehealth: Payer: Self-pay | Admitting: Nurse Practitioner

## 2019-06-28 NOTE — Progress Notes (Deleted)
CARDIOLOGY OFFICE NOTE  Date:  06/29/2019    [name] Date of Birth: Mar 23, 1924 Medical Record #[id number]  PCP:  Lajean Manes, MD  Cardiologist:  Marisa Cyphers  No chief complaint on file.   History of Present Illness: RINA ADNEY is a 85 y.o. female who presents today for a follow up visit. Seen for Dr. Marlou Porch.   She has a history of persistent AF - she is on chronic Eliquis, CKD, HLD, mild MR and LAFB.   Last seen by me in July of 2019. Saw Dr. Marlou Porch in January and was felt to be doing ok. She remained in AF. Had had a fall due to concrete being uneven and missing a step. Had hit her head and saw her PCP. Still pretty independent.   The patient {does/does not:200015} have symptoms concerning for COVID-19 infection (fever, chills, cough, or new shortness of breath).   Comes in today. Here with   Past Medical History:  Diagnosis Date  . Atrial fibrillation ([place])   . Chronic renal disease, stage II   . GERD (gastroesophageal reflux disease)   . Hypercholesteremia   . MR (mitral regurgitation)    , Mild  . Osteoarthritis   . Osteoporosis     Past Surgical History:  Procedure Laterality Date  . BACK SURGERY    . JOINT REPLACEMENT       Medications: No outpatient medications have been marked as taking for the 06/29/19 encounter (Appointment) with Burtis Junes, NP.     Allergies: Allergies  Allergen Reactions  . Darvocet [Propoxyphene N-Acetaminophen] Nausea And Vomiting  . Lodine [Etodolac]     Unknown to patient  . Meprozine [Meperidine-Promethazine]     Reaction=tongue swelling  . Percocet [Oxycodone-Acetaminophen]   . Ultracet [Tramadol-Acetaminophen] Nausea And Vomiting  . Demerol Nausea And Vomiting  . Hydrocodone Rash  . Penicillins Rash    Social History: The patient  reports that she has never smoked. She has never used smokeless tobacco. She reports that she does not drink alcohol or use drugs.   Family History:  The patient's ***family history includes Arthritis in her mother; Heart attack in her father; Hypothyroidism in her mother.   Review of Systems: Please see the history of present illness.   All other systems are reviewed and negative.   Physical Exam: VS:  There were no vitals taken for this visit. Marland Kitchen  BMI There is no height or weight on file to calculate BMI.  Wt Readings from Last 3 Encounters:  12/22/18 138 lb 6.4 oz (62.8 kg)  07/06/18 139 lb 6.4 oz (63.2 kg)  12/24/17 138 lb 1.9 oz (62.7 kg)    General: Pleasant. Well developed, well nourished and in no acute distress.   HEENT: Normal.  Neck: Supple, no JVD, carotid bruits, or masses noted.  Cardiac: ***Regular rate and rhythm. No murmurs, rubs, or gallops. No edema.  Respiratory:  Lungs are clear to auscultation bilaterally with normal work of breathing.  GI: Soft and nontender.  MS: No deformity or atrophy. Gait and ROM intact.  Skin: Warm and dry. Color is normal.  Neuro:  Strength and sensation are intact and no gross focal deficits noted.  Psych: Alert, appropriate and with normal affect.   LABORATORY DATA:  EKG:  EKG {ACTION; IS/IS YQM:[id number]} ordered today. This demonstrates ***.  Lab Results  Component Value Date   WBC 6.3 07/06/2018   HGB 12.9 07/06/2018   HCT 38.5 07/06/2018   PLT  192 07/06/2018   GLUCOSE 81 07/06/2018   ALT 19 11/23/2011   AST 23 11/23/2011   NA 143 07/06/2018   K 4.8 07/06/2018   CL 102 07/06/2018   CREATININE 1.27 (H) 07/06/2018   BUN 26 07/06/2018   CO2 25 07/06/2018     BNP (last 3 results) No results for input(s): BNP in the last 8760 hours.  ProBNP (last 3 results) No results for input(s): PROBNP in the last 8760 hours.   Other Studies Reviewed Today:  1. Persistent atrial fibrillation -Sinus rhythmon prior visit,Rate controlled AFIB today once again.Eliquis, score of 4, continue Toprol, previous labs excellent.   2. Chronic anticoagulation -Eliquis 5 mg twice a  day is appropriate dose.  62.8 kg, creatinine 1.2  3. LAFB - would need event monitor if has syncope -  4. HTN - ok to allow slightly higher BP given her occasional wooziness  5. Mild MR  6. Advanced age - does pretty well.   7. COVID-19 Education: The signs and symptoms of COVID-19 were discussed with the patient and how to seek care for testing (follow up with PCP or arrange E-visit).  The importance of social distancing, staying at home, hand hygiene and wearing a mask when out in public were discussed today.  Current medicines are reviewed with the patient today.  The patient does not have concerns regarding medicines other than what has been noted above.  The following changes have been made:  See above.  Labs/ tests ordered today include:   No orders of the defined types were placed in this encounter.    Disposition:   FU with *** in {gen number 6-26:948546} {Days to years:10300}.   Patient is agreeable to this plan and will call if any problems develop in the interim.   SignedNorma Fredrickson, NP  06/29/2019 7:54 AM  [facility] [address] [place], [place]  [address] Phone: [phone] Fax: [phone]

## 2019-06-28 NOTE — Telephone Encounter (Signed)
New Message    Left message to confirm appt answer covid questions

## 2019-06-29 ENCOUNTER — Ambulatory Visit: Payer: Medicare Other | Admitting: Nurse Practitioner

## 2019-08-13 ENCOUNTER — Telehealth: Payer: Self-pay | Admitting: Cardiology

## 2019-08-13 NOTE — Telephone Encounter (Signed)
° °  Patient's daughter calling back requesting advice. Dizziness, weakness and jittery

## 2019-08-13 NOTE — Telephone Encounter (Signed)
New message:     Patient calling stating that she is very weak and would like to speak with some one. Patient states it could be the medication.

## 2019-08-13 NOTE — Telephone Encounter (Signed)
As of 2:45 pm I have an 8 am spot open if she feels like she could be there for this.  I do not know what Dr. Kandis Mannan availability is.  She could see another provider as well.  Will most likely need an event monitor which would be mailed to her - she could do this instead.   Cecille Rubin

## 2019-08-13 NOTE — Progress Notes (Deleted)
CARDIOLOGY OFFICE NOTE  Date:  08/13/2019    Jessica Berg Date of Birth: [birth year] Medical Record #[id number]  PCP:  Merlene Laughter, MD  Cardiologist:  Tyrone Sage & ***    No chief complaint on file.   History of Present Illness: Jessica Berg is a 85 y.o. female who presents today for a *** PAF.   AF was noted 12/2017.  Once again, she is in atrial fibrillation on today's EKG but rate controlled on Toprol.  She had a visit with her eye physician, who noticed left eye ptosis.  Dr. Pete Glatter has seen her and has ordered an MRI.  No reason to change Eliquis.  Continue.  Denies any chest pain, shortness of breath, fevers chills nausea vomiting syncope. The patient {does/does not:200015} have symptoms concerning for COVID-19 infection (fever, chills, cough, or new shortness of breath).   Comes in today. Here with   Past Medical History:  Diagnosis Date  . Atrial fibrillation (HCC)   . Chronic renal disease, stage II   . GERD (gastroesophageal reflux disease)   . Hypercholesteremia   . MR (mitral regurgitation)    , Mild  . Osteoarthritis   . Osteoporosis     Past Surgical History:  Procedure Laterality Date  . BACK SURGERY    . JOINT REPLACEMENT       Medications: No outpatient medications have been marked as taking for the 08/18/19 encounter (Appointment) with Rosalio Macadamia, NP.     Allergies: Allergies  Allergen Reactions  . Darvocet [Propoxyphene N-Acetaminophen] Nausea And Vomiting  . Lodine [Etodolac]     Unknown to patient  . Meprozine [Meperidine-Promethazine]     Reaction=tongue swelling  . Percocet [Oxycodone-Acetaminophen]   . Ultracet [Tramadol-Acetaminophen] Nausea And Vomiting  . Demerol Nausea And Vomiting  . Hydrocodone Rash  . Penicillins Rash    Social History: The patient  reports that she has never smoked. She has never used smokeless tobacco. She reports that she does not drink alcohol or use drugs.   Family History:  The patient's ***family history includes Arthritis in her mother; Heart attack in her father; Hypothyroidism in her mother.   Review of Systems: Please see the history of present illness.   All other systems are reviewed and negative.   Physical Exam: VS:  There were no vitals taken for this visit. Marland Kitchen  BMI There is no height or weight on file to calculate BMI.  Wt Readings from Last 3 Encounters:  12/22/18 138 lb 6.4 oz (62.8 kg)  07/06/18 139 lb 6.4 oz (63.2 kg)  12/24/17 138 lb 1.9 oz (62.7 kg)    General: Pleasant. Well developed, well nourished and in no acute distress.   HEENT: Normal.  Neck: Supple, no JVD, carotid bruits, or masses noted.  Cardiac: ***Regular rate and rhythm. No murmurs, rubs, or gallops. No edema.  Respiratory:  Lungs are clear to auscultation bilaterally with normal work of breathing.  GI: Soft and nontender.  MS: No deformity or atrophy. Gait and ROM intact.  Skin: Warm and dry. Color is normal.  Neuro:  Strength and sensation are intact and no gross focal deficits noted.  Psych: Alert, appropriate and with normal affect.   LABORATORY DATA:  EKG:  EKG {ACTION; IS/IS QHU:76546503} ordered today. This demonstrates ***.  Lab Results  Component Value Date   WBC 6.3 07/06/2018   HGB 12.9 07/06/2018   HCT 38.5 07/06/2018   PLT 192 07/06/2018   GLUCOSE 81 07/06/2018  ALT 19 11/23/2011   AST 23 11/23/2011   NA 143 07/06/2018   K 4.8 07/06/2018   CL 102 07/06/2018   CREATININE 1.27 (H) 07/06/2018   BUN 26 07/06/2018   CO2 25 07/06/2018     BNP (last 3 results) No results for input(s): BNP in the last 8760 hours.  ProBNP (last 3 results) No results for input(s): PROBNP in the last 8760 hours.   Other Studies Reviewed Today:   Assessment/Plan: Persistent atrial fibrillation -Sinus rhythmon prior visit,Rate controlled AFIB today once again.Eliquis, score of 4, continue Toprol, previous labs excellent.   Chronic anticoagulation  -Eliquis 5 mg twice a day is appropriate dose.  62.8 kg, creatinine 1.2  left anterior fascicular block -Stable, if syncope occurs, event monitor.She is not too eager about having another monitor if needed.  No changes made.  Essential hypertension -Continue with blood pressure control, stable.Willing to tolerate a slightly higher BP given her occasional wooziness.  Overall doing quite well.  Mild mitral regurgitation -Stable.  Left eye ptosis . COVID-19 Education: The signs and symptoms of COVID-19 were discussed with the patient and how to seek care for testing (follow up with PCP or arrange E-visit).  The importance of social distancing, staying at home, hand hygiene and wearing a mask when out in public were discussed today.  Current medicines are reviewed with the patient today.  The patient does not have concerns regarding medicines other than what has been noted above.  The following changes have been made:  See above.  Labs/ tests ordered today include:   No orders of the defined types were placed in this encounter.    Disposition:   FU with *** in {gen number 4-28:768115} {Days to years:10300}.   Patient is agreeable to this plan and will call if any problems develop in the interim.   SignedTruitt Merle, NP  08/13/2019 3:40 PM  [facility] [address] [place], [place]  [address] Phone: [phone] Fax: [phone]

## 2019-08-13 NOTE — Telephone Encounter (Signed)
Pt is concerned because she has been feeling extremely weak. She reports a "pins and needles" feeling all over her body and feels as if she might pass out at any time. Pt thinks this may be a medication issue but has not made any new med changes recently. She would like to add that she did go to the eye doctor yesterday and they told her the pressure in her eyes were extremely high.

## 2019-08-13 NOTE — Telephone Encounter (Signed)
Made a 9/2 OV 8am  appt with Tera Helper for pt

## 2019-08-16 ENCOUNTER — Telehealth: Payer: Self-pay | Admitting: Cardiology

## 2019-08-16 NOTE — Telephone Encounter (Signed)
Spoke to daughter and she says her mom has been having episodes of dizziness,sob, feeling weak, balance bad.  EMS was called on Sat night due to her feeling like she was going to pass out.  Her vitals were 160/90 HR HR 82-118.  Today her daughter is calling as she is still feeling like this.  Daughter says her HR today is 64 but she just doesn't feel good today.  Her Metoprolol is 12.5 mg daily.  I discussed above with Dr. Marlou Porch and will give her another Metoprolol 12.5 mg today and see him tomorrow at 1:40 and cancel Wed appointment with Truitt Merle, NP.  Daughter is aware and appreciative of my call.     COVID-19 Pre-Screening Questions:  . In the past 7 to 10 days have you had a cough,  shortness of breath, headache, congestion, fever (100 or greater) body aches, chills, sore throat, or sudden loss of taste or sense of smell? No . Have you been around anyone with known Covid 19. No . Have you been around anyone who is awaiting Covid 19 test results in the past 7 to 10 days? No . Have you been around anyone who has been exposed to Covid 19, or has mentioned symptoms of Covid 19 within the past 7 to 10 days? No  If you have any concerns/questions about symptoms patients report during screening (either on the phone or at threshold). Contact the provider seeing the patient or DOD for further guidance.  If neither are available contact a member of the leadership team.   Her daughter will be with her and has answered no to the above questions as she states her mom is having balance problems and needs to escort her upstairs.  They are aware to arrive 15 mins prior to the appointment and need to wear mask.

## 2019-08-16 NOTE — Telephone Encounter (Signed)
Pt has appt with Truitt Merle, NP 08/18/19. I will route to Dr. Marlou Porch as Juluis Rainier.

## 2019-08-16 NOTE — Telephone Encounter (Signed)
New message:    Patient daughter calling stating that her mother went to the ER. They would like to see some one today.

## 2019-08-17 ENCOUNTER — Encounter: Payer: Self-pay | Admitting: Cardiology

## 2019-08-17 ENCOUNTER — Encounter (INDEPENDENT_AMBULATORY_CARE_PROVIDER_SITE_OTHER): Payer: Self-pay

## 2019-08-17 ENCOUNTER — Ambulatory Visit (INDEPENDENT_AMBULATORY_CARE_PROVIDER_SITE_OTHER): Payer: Medicare Other | Admitting: Cardiology

## 2019-08-17 ENCOUNTER — Other Ambulatory Visit: Payer: Self-pay

## 2019-08-17 VITALS — BP 128/68 | HR 60 | Ht 60.0 in | Wt 131.0 lb

## 2019-08-17 DIAGNOSIS — H02402 Unspecified ptosis of left eyelid: Secondary | ICD-10-CM | POA: Diagnosis not present

## 2019-08-17 DIAGNOSIS — I444 Left anterior fascicular block: Secondary | ICD-10-CM | POA: Diagnosis not present

## 2019-08-17 DIAGNOSIS — I480 Paroxysmal atrial fibrillation: Secondary | ICD-10-CM

## 2019-08-17 MED ORDER — METOPROLOL SUCCINATE ER 25 MG PO TB24: 25.0000 mg | ORAL_TABLET | Freq: Every day | ORAL | 3 refills | Status: DC

## 2019-08-17 NOTE — Progress Notes (Signed)
Cardiology Office Note:    Date:  08/17/2019   ID:  Jessica Berg, DOB 08/02/1924, MRN [id number]  PCP:  Lajean Manes, MD  Cardiologist:  Candee Furbish, MD  Electrophysiologist:  None   Referring MD: Lajean Manes, MD     History of Present Illness:    Jessica Berg is a 85 y.o. female here for follow up of PAF.   AF was noted 12/2017.  Once again, she is in atrial fibrillation on today's EKG but rate controlled on Toprol.  She had a visit with her eye physician, who noticed left eye ptosis.  Dr. Felipa Eth has seen her and has ordered an MRI.  No reason to change Eliquis.  Continue.  Has a rush to head then feels like going out.  This seems to have come back.  In review of my notes, several years ago she used to complain of similar findings.  She also complained of tremulousness at times.  She reminded me that we had placed her on a Holter monitor and then placed her on an event monitor both of which were unremarkable in the past.  I do not have records of this in this epic system.  Denies any chest pain, shortness of breath, fevers chills nausea vomiting syncope.  Past Medical History:  Diagnosis Date  . Atrial fibrillation ([place])   . Chronic renal disease, stage II   . GERD (gastroesophageal reflux disease)   . Hypercholesteremia   . MR (mitral regurgitation)    , Mild  . Osteoarthritis   . Osteoporosis     Past Surgical History:  Procedure Laterality Date  . BACK SURGERY    . JOINT REPLACEMENT      Current Medications: Current Meds  Medication Sig  . Calcium Carbonate-Vitamin D3 (CALCIUM 600-D) 600-400 MG-UNIT TABS Take 1 tablet by mouth daily.  . celecoxib (CELEBREX) 200 MG capsule Take 200 mg by mouth daily.    . cholecalciferol (VITAMIN D) 1000 UNITS tablet Take 1,000 Units by mouth daily.    Marland Kitchen ELIQUIS 5 MG TABS tablet TAKE 1 TABLET TWICE A DAY  . vitamin C (ASCORBIC ACID) 500 MG tablet Take 500 mg by mouth daily.       Allergies:   Darvocet  [propoxyphene n-acetaminophen], Lodine [etodolac], Meprozine [meperidine-promethazine], Percocet [oxycodone-acetaminophen], Ultracet [tramadol-acetaminophen], Demerol, Hydrocodone, and Penicillins   Social History   Socioeconomic History  . Marital status: Widowed    Spouse name: Not on file  . Number of children: Not on file  . Years of education: Not on file  . Highest education level: Not on file  Occupational History  . Not on file  Social Needs  . Financial resource strain: Not on file  . Food insecurity    Worry: Not on file    Inability: Not on file  . Transportation needs    Medical: Not on file    Non-medical: Not on file  Tobacco Use  . Smoking status: Never Smoker  . Smokeless tobacco: Never Used  Substance and Sexual Activity  . Alcohol use: No  . Drug use: No  . Sexual activity: Not on file  Lifestyle  . Physical activity    Days per week: Not on file    Minutes per session: Not on file  . Stress: Not on file  Relationships  . Social Herbalist on phone: Not on file    Gets together: Not on file    Attends religious service: Not on file  Active member of club or organization: Not on file    Attends meetings of clubs or organizations: Not on file    Relationship status: Not on file  Other Topics Concern  . Not on file  Social History Narrative  . Not on file     Family History: The patient's family history includes Arthritis in her mother; Heart attack in her father; Hypothyroidism in her mother.  ROS:   Please see the history of present illness.    No fevers chills nausea vomiting syncope.  Has hearing loss back pain dizziness all other systems reviewed and are negative.  EKGs/Labs/Other Studies Reviewed:    The following studies were reviewed today: Prior office notes reviewed  EKG:   atrial fibrillation 82 left axis deviation, personally reviewed and interpreted  Recent Labs: No results found for requested labs within last 8760  hours.  Recent Lipid Panel No results found for: CHOL, TRIG, HDL, CHOLHDL, VLDL, LDLCALC, LDLDIRECT  Physical Exam:    VS:  BP 128/68   Pulse 60   Ht 5' (1.524 m)   Wt 131 lb (59.4 kg)   SpO2 96%   BMI 25.58 kg/m      Wt Readings from Last 3 Encounters:  08/17/19 131 lb (59.4 kg)  12/22/18 138 lb 6.4 oz (62.8 kg)  07/06/18 139 lb 6.4 oz (63.2 kg)     GEN: Well nourished, well developed, in no acute distress  HEENT: normal With left eye ptosis Neck: no JVD, carotid bruits, or masses Cardiac: irreg; no murmurs, rubs, or gallops,no edema  Respiratory:  clear to auscultation bilaterally, normal work of breathing GI: soft, nontender, nondistended, + BS MS: no deformity or atrophy  Skin: warm and dry, no rash Neuro:  Alert and Oriented x 3, Strength and sensation are intact Psych: euthymic mood, full affect   ASSESSMENT:    1. Paroxysmal atrial fibrillation (HCC)   2. Left anterior fascicular block   3. Eyelash ptosis of left eye    PLAN:    In order of problems listed above:  Persistent atrial fibrillation -Now Rate controlled AFIB. Eliquis, score of 4, continue Toprol, previous labs excellent.  - I wonder if she is having perhaps some conversions with compensatory pauses.  We had gone down the path of event monitor in the past without any success.  I am going to refer her to EP to potentially consider implantable loop recorder given the sensations.   Chronic anticoagulation -Eliquis 5 mg twice a day is appropriate dose.  62.8 kg, creatinine 1.2  left anterior fascicular block -Sending to EP to consider loop recorder  Essential hypertension -Continue with blood pressure control, stable. Willing to tolerate a slightly higher BP given her occasional wooziness.   Overall doing quite well.   Mild mitral regurgitation -Stable.  Left eye ptosis - Dr. Dione Booze, Dr. Pete Glatter, MRI has been performed.  No signs of stroke.   Medication Adjustments/Labs and Tests  Ordered: Current medicines are reviewed at length with the patient today.  Concerns regarding medicines are outlined above.  Orders Placed This Encounter  Procedures  . Ambulatory referral to Cardiac Electrophysiology   Meds ordered this encounter  Medications  . metoprolol succinate (TOPROL XL) 25 MG 24 hr tablet    Sig: Take 1 tablet (25 mg total) by mouth daily.    Dispense:  90 tablet    Refill:  3    Patient Instructions  Medication Instructions:  Your physician has recommended you make the following  change in your medication:  Increase metoprolol to 25 mg daily  If you need a refill on your cardiac medications before your next appointment, please call your pharmacy.   Lab work: None Ordered If you have labs (blood work) drawn today and your tests are completely normal, you will receive your results only by: Marland Kitchen. MyChart Message (if you have MyChart) OR . A paper copy in the mail If you have any lab test that is abnormal or we need to change your treatment, we will call you to review the results.  Testing/Procedures: You have been referred to Electrophysiology Clinic   Follow-Up: At [facility], you and your health needs are our priority.  As part of our continuing mission to provide you with exceptional heart care, we have created designated Provider Care Teams.  These Care Teams include your primary Cardiologist (physician) and Advanced Practice Providers (APPs -  Physician Assistants and Nurse Practitioners) who all work together to provide you with the care you need, when you need it. You will need a follow up appointment in 1 years.  Please call our office 2 months in advance to schedule this appointment.  You may see Donato SchultzMark Skains, MD or one of the following Advanced Practice Providers on your designated Care Team:   Norma FredricksonLori Gerhardt, NP Nada BoozerLaura Ingold, NP . Georgie ChardJill McDaniel, NP  Any Other Special Instructions Will Be Listed Below (If Applicable).       Signed, Donato SchultzMark  Skains, MD  08/17/2019 2:58 PM    [facility]

## 2019-08-17 NOTE — Patient Instructions (Addendum)
Medication Instructions:  Your physician has recommended you make the following change in your medication:  Increase metoprolol to 25 mg daily  If you need a refill on your cardiac medications before your next appointment, please call your pharmacy.   Lab work: None Ordered If you have labs (blood work) drawn today and your tests are completely normal, you will receive your results only by: Marland Kitchen MyChart Message (if you have MyChart) OR . A paper copy in the mail If you have any lab test that is abnormal or we need to change your treatment, we will call you to review the results.  Testing/Procedures: You have been referred to Electrophysiology Clinic   Follow-Up: At [facility], you and your health needs are our priority.  As part of our continuing mission to provide you with exceptional heart care, we have created designated Provider Care Teams.  These Care Teams include your primary Cardiologist (physician) and Advanced Practice Providers (APPs -  Physician Assistants and Nurse Practitioners) who all work together to provide you with the care you need, when you need it. You will need a follow up appointment in 1 years.  Please call our office 2 months in advance to schedule this appointment.  You may see Candee Furbish, MD or one of the following Advanced Practice Providers on your designated Care Team:   Truitt Merle, NP Cecilie Kicks, NP . Kathyrn Drown, NP  Any Other Special Instructions Will Be Listed Below (If Applicable).

## 2019-08-18 ENCOUNTER — Ambulatory Visit: Payer: Medicare Other | Admitting: Nurse Practitioner

## 2019-08-25 ENCOUNTER — Encounter: Payer: Self-pay | Admitting: Cardiology

## 2019-08-25 ENCOUNTER — Ambulatory Visit (INDEPENDENT_AMBULATORY_CARE_PROVIDER_SITE_OTHER): Payer: Medicare Other | Admitting: Cardiology

## 2019-08-25 ENCOUNTER — Other Ambulatory Visit: Payer: Self-pay

## 2019-08-25 VITALS — BP 120/68 | HR 93 | Ht 60.0 in | Wt 131.0 lb

## 2019-08-25 DIAGNOSIS — I4819 Other persistent atrial fibrillation: Secondary | ICD-10-CM | POA: Diagnosis not present

## 2019-08-25 NOTE — Progress Notes (Signed)
Electrophysiology Office Note   Date:  08/25/2019   ID:  Jessica Berg, DOB [birth year], MRN [id number]  PCP:  Lajean Manes, MD  Cardiologist:  Marlou Porch Primary Electrophysiologist:  Elynn Patteson Meredith Leeds, MD    Chief Complaint: atrial fibrillation   History of Present Illness: Jessica Berg is a 85 y.o. female who is being seen today for the evaluation of AF, syncope at the request of Jerline Pain, MD. Presenting today for electrophysiology evaluation.  She has a history of atrial fibrillation, CKD stage II, hyperlipidemia, and mild mitral regurgitation.  There time that she has a rush to her head and feels like she is going to pass out.  She has had this for multiple years.  She is also had tremulousness.  She wore a Holter monitor and an event monitor which were both unremarkable in the past.  These are not in the epic system.  She does have a cardia monitor.  The monitor shows atrial fibrillation with rates in the 70s to 80s.  She had an episode overnight a few weeks ago.  EMS was called.  They did an ECG that also shows atrial fibrillation with rates in the mid 70s.  Today, she denies symptoms of palpitations, chest pain, shortness of breath, orthopnea, PND, lower extremity edema, claudication, dizziness, presyncope, syncope, bleeding, or neurologic sequela. The patient is tolerating medications without difficulties.    Past Medical History:  Diagnosis Date   Atrial fibrillation (HCC)    Chronic renal disease, stage II    GERD (gastroesophageal reflux disease)    Hypercholesteremia    MR (mitral regurgitation)    , Mild   Osteoarthritis    Osteoporosis    Past Surgical History:  Procedure Laterality Date   BACK SURGERY     JOINT REPLACEMENT       Current Outpatient Medications  Medication Sig Dispense Refill   Calcium Carbonate-Vitamin D3 (CALCIUM 600-D) 600-400 MG-UNIT TABS Take 1 tablet by mouth daily.     celecoxib (CELEBREX) 200 MG capsule Take  200 mg by mouth daily.       cholecalciferol (VITAMIN D) 1000 UNITS tablet Take 1,000 Units by mouth daily.       ELIQUIS 5 MG TABS tablet TAKE 1 TABLET TWICE A DAY 180 tablet 1   latanoprost (XALATAN) 0.005 % ophthalmic solution Place 1 drop into both eyes daily.     metoprolol succinate (TOPROL XL) 25 MG 24 hr tablet Take 1 tablet (25 mg total) by mouth daily. 90 tablet 3   vitamin C (ASCORBIC ACID) 500 MG tablet Take 500 mg by mouth daily.       No current facility-administered medications for this visit.     Allergies:   Darvocet [propoxyphene n-acetaminophen], Lodine [etodolac], Meprozine [meperidine-promethazine], Percocet [oxycodone-acetaminophen], Ultracet [tramadol-acetaminophen], Demerol, Hydrocodone, and Penicillins   Social History:  The patient  reports that she has never smoked. She has never used smokeless tobacco. She reports that she does not drink alcohol or use drugs.   Family History:  The patient's family history includes Arthritis in her mother; Heart attack in her father; Hypothyroidism in her mother.    ROS:  Please see the history of present illness.   Otherwise, review of systems is positive for none.   All other systems are reviewed and negative.    PHYSICAL EXAM: VS:  BP 120/68    Pulse 93    Ht 5' (1.524 m)    Wt 131 lb (59.4 kg)  SpO2 98%    BMI 25.58 kg/m  , BMI Body mass index is 25.58 kg/m. GEN: Well nourished, well developed, in no acute distress  HEENT: normal  Neck: no JVD, carotid bruits, or masses Cardiac: RRR; no murmurs, rubs, or gallops,no edema  Respiratory:  clear to auscultation bilaterally, normal work of breathing GI: soft, nontender, nondistended, + BS MS: no deformity or atrophy  Skin: warm and dry Neuro:  Strength and sensation are intact Psych: euthymic mood, full affect  EKG:  EKG is ordered today. Personal review of the ekg ordered shows atrial fibrillation, rate 93, left anterior fascicular block, septal Q waves  Recent  Labs: No results found for requested labs within last 8760 hours.    Lipid Panel  No results found for: CHOL, TRIG, HDL, CHOLHDL, VLDL, LDLCALC, LDLDIRECT   Wt Readings from Last 3 Encounters:  08/25/19 131 lb (59.4 kg)  08/17/19 131 lb (59.4 kg)  12/22/18 138 lb 6.4 oz (62.8 kg)      Other studies Reviewed: Additional studies/ records that were reviewed today include: none     ASSESSMENT AND PLAN:  1.  Persistent atrial fibrillation: Currently rate controlled and on Eliquis.  She is having episodes of possibly dizziness and fatigue.  She has measured her heart rate during these episodes which is showed no episodes of bradycardia.  She has atrial fibrillation, but as she is persistent, I do not feel this is contributing.  There was a question of whether or not she could be having some bradycardia, and thus I have asked her to check her heart rate with her cardia app when she is having these episodes.  I [place] Dube discuss this with her primary cardiologist.  This patients CHA2DS2-VASc Score and unadjusted Ischemic Stroke Rate (% per year) is equal to 4.8 % stroke rate/year from a score of 4  Above score calculated as 1 point each if present [CHF, HTN, DM, Vascular=MI/PAD/Aortic Plaque, Age if 65-74, or Female] Above score calculated as 2 points each if present [Age > 75, or Stroke/TIA/TE]   2.  Hypertension: Currently well controlled   Current medicines are reviewed at length with the patient today.   The patient does not have concerns regarding her medicines.  The following changes were made today:  none  Labs/ tests ordered today include:  Orders Placed This Encounter  Procedures   EKG 12-Lead     Disposition:   FU with Jackalynn Art as needed  Signed, Franceen Erisman Jorja Loa, MD  08/25/2019 1:57 PM     [facility] [address] [place] [address] [phone] (office) [phone] (fax)

## 2019-08-25 NOTE — Patient Instructions (Signed)
Medication Instructions:  Your physician recommends that you continue on your current medications as directed. Please refer to the Current Medication list given to you today.  * If you need a refill on your cardiac medications before your next appointment, please call your pharmacy.   Labwork: None ordered  Testing/Procedures: None ordered  Follow-Up: To be determined.  Dr. Curt Bears is going to discuss plan with Dr. Marlou Porch.  Thank you for choosing [facility]!!   Trinidad Curet, RN [phone]

## 2019-09-13 ENCOUNTER — Telehealth: Payer: Self-pay | Admitting: *Deleted

## 2019-09-13 NOTE — Telephone Encounter (Signed)
Left detailed message (DPR on file) informing pt PRN f/u w/ Dr. Curt Bears. Advised to follow up with Dr. Marlou Porch, as scheduled, and call office if having issues w/ AFib. Made aware she is PRN with Dr. Curt Bears going forward. Advised to call office if she had further questions/concerns.

## 2019-10-20 ENCOUNTER — Other Ambulatory Visit: Payer: Self-pay | Admitting: Cardiology

## 2019-10-20 NOTE — Telephone Encounter (Addendum)
Prescription refill request for Eliquis received.  Last office visit: [place] (08-25-2019) Scr: 1.05 (08-27-2019 via [place]) Age: 85 y.o.  Weight: 59.4 kg  Pt qualifies for a dose decrease, spoke to [employer] D will decrease pt's dose of eliquis to 2.5 mg BID. Called pt and made her aware that her prescription refill for Eliquis would now be for Eliquis 2.5 mg BID.

## 2020-01-21 ENCOUNTER — Other Ambulatory Visit: Payer: Self-pay | Admitting: Cardiology

## 2020-03-30 ENCOUNTER — Other Ambulatory Visit: Payer: Self-pay | Admitting: Cardiology

## 2020-03-30 NOTE — Telephone Encounter (Signed)
Prescription refill request for Eliquis received.  Last office visit: 08/25/2019, [place] Scr:  1.1, 3/9/20201 Age: 86 y.o. Weight: 59.4 kg   Prescription refill sent.

## 2020-09-06 ENCOUNTER — Ambulatory Visit (INDEPENDENT_AMBULATORY_CARE_PROVIDER_SITE_OTHER): Payer: Medicare Other | Admitting: Cardiology

## 2020-09-06 ENCOUNTER — Other Ambulatory Visit: Payer: Self-pay

## 2020-09-06 ENCOUNTER — Encounter: Payer: Self-pay | Admitting: Cardiology

## 2020-09-06 VITALS — BP 130/70 | HR 77 | Ht 60.0 in | Wt 128.0 lb

## 2020-09-06 DIAGNOSIS — I10 Essential (primary) hypertension: Secondary | ICD-10-CM

## 2020-09-06 DIAGNOSIS — I480 Paroxysmal atrial fibrillation: Secondary | ICD-10-CM

## 2020-09-06 NOTE — Patient Instructions (Signed)
Medication Instructions:  The current medical regimen is effective;  continue present plan and medications.  *If you need a refill on your cardiac medications before your next appointment, please call your pharmacy*  Follow-Up: At [facility], you and your health needs are our priority.  As part of our continuing mission to provide you with exceptional heart care, we have created designated Provider Care Teams.  These Care Teams include your primary Cardiologist (physician) and Advanced Practice Providers (APPs -  Physician Assistants and Nurse Practitioners) who all work together to provide you with the care you need, when you need it.  We recommend signing up for the patient portal called "MyChart".  Sign up information is provided on this After Visit Summary.  MyChart is used to connect with patients for Virtual Visits (Telemedicine).  Patients are able to view lab/test results, encounter notes, upcoming appointments, etc.  Non-urgent messages can be sent to your provider as well.   To learn more about what you can do with MyChart, go to [email or web address].com.    Your next appointment:   12 month(s)  The format for your next appointment:   In Person  Provider:   Mark Skains, MD   Thank you for choosing [facility]!!      

## 2020-09-06 NOTE — Progress Notes (Signed)
Cardiology Office Note:    Date:  09/06/2020   ID:  Jessica Berg, DOB 1924/03/01, MRN [id number]  PCP:  Merlene Laughter, MD  [facility] Cardiologist:  Donato Schultz, MD  [facility] Electrophysiologist:  None   Referring MD: Merlene Laughter, MD     History of Present Illness:    Jessica Berg is a 86 y.o. female here for the follow-up of atrial fibrillation.  Was seen by Dr. Elberta Fortis for syncope, AF.  Had a rushing feeling to her head felt like she was in a pass out.  Has happened for multiple years.  Thankfully, no syncope.  She is since stopped driving.  Prism left eye glasses. Lives on her own.  Tries to do much on her own.  Has daughters for support.    Prior monitoring showed no episodes of bradycardia.    Past Medical History:  Diagnosis Date  . Atrial fibrillation (HCC)   . Chronic renal disease, stage II   . GERD (gastroesophageal reflux disease)   . Hypercholesteremia   . MR (mitral regurgitation)    , Mild  . Osteoarthritis   . Osteoporosis     Past Surgical History:  Procedure Laterality Date  . BACK SURGERY    . JOINT REPLACEMENT      Current Medications: Current Meds  Medication Sig  . Calcium Carbonate-Vitamin D3 (CALCIUM 600-D) 600-400 MG-UNIT TABS Take 1 tablet by mouth daily.  . celecoxib (CELEBREX) 200 MG capsule Take 200 mg by mouth daily.    . cholecalciferol (VITAMIN D) 1000 UNITS tablet Take 1,000 Units by mouth daily.    . dorzolamide-timolol (COSOPT) 22.3-6.8 MG/ML ophthalmic solution Place 1 drop into the right eye 2 (two) times daily.  Marland Kitchen ELIQUIS 2.5 MG TABS tablet TAKE 1 TABLET TWICE A DAY  . latanoprost (XALATAN) 0.005 % ophthalmic solution Place 1 drop into both eyes daily.  . metoprolol succinate (TOPROL-XL) 25 MG 24 hr tablet Take 1 tablet (25 mg total) by mouth daily.  . vitamin C (ASCORBIC ACID) 500 MG tablet Take 500 mg by mouth daily.       Allergies:   Darvocet [propoxyphene n-acetaminophen], Lodine [etodolac],  Meprozine [meperidine-promethazine], Percocet [oxycodone-acetaminophen], Ultracet [tramadol-acetaminophen], Demerol, Hydrocodone, and Penicillins   Social History   Socioeconomic History  . Marital status: Widowed    Spouse name: Not on file  . Number of children: Not on file  . Years of education: Not on file  . Highest education level: Not on file  Occupational History  . Not on file  Tobacco Use  . Smoking status: Never Smoker  . Smokeless tobacco: Never Used  Substance and Sexual Activity  . Alcohol use: No  . Drug use: No  . Sexual activity: Not on file  Other Topics Concern  . Not on file  Social History Narrative  . Not on file   Social Determinants of Health   Financial Resource Strain:   . Difficulty of Paying Living Expenses: Not on file  Food Insecurity:   . Worried About Programme researcher, broadcasting/film/video in the Last Year: Not on file  . Ran Out of Food in the Last Year: Not on file  Transportation Needs:   . Lack of Transportation (Medical): Not on file  . Lack of Transportation (Non-Medical): Not on file  Physical Activity:   . Days of Exercise per Week: Not on file  . Minutes of Exercise per Session: Not on file  Stress:   . Feeling of Stress :  Not on file  Social Connections:   . Frequency of Communication with Friends and Family: Not on file  . Frequency of Social Gatherings with Friends and Family: Not on file  . Attends Religious Services: Not on file  . Active Member of Clubs or Organizations: Not on file  . Attends Banker Meetings: Not on file  . Marital Status: Not on file     Family History: The patient's family history includes Arthritis in her mother; Heart attack in her father; Hypothyroidism in her mother.  ROS:   Please see the history of present illness.     All other systems reviewed and are negative.  EKGs/Labs/Other Studies Reviewed:     EKG:  EKG is  ordered today.  The ekg ordered today demonstrates atrial fibrillation left  anterior fascicular block 73.  Recent Labs: No results found for requested labs within last 8760 hours.  Recent Lipid Panel No results found for: CHOL, TRIG, HDL, CHOLHDL, VLDL, LDLCALC, LDLDIRECT  Physical Exam:    VS:  BP 130/70   Pulse 77   Ht 5' (1.524 m)   Wt 128 lb (58.1 kg)   SpO2 97%   BMI 25.00 kg/m     Wt Readings from Last 3 Encounters:  09/06/20 128 lb (58.1 kg)  08/25/19 131 lb (59.4 kg)  08/17/19 131 lb (59.4 kg)     GEN:  Well nourished, well developed in no acute distress HEENT: Normal NECK: No JVD; No carotid bruits LYMPHATICS: No lymphadenopathy CARDIAC: irreg, no murmurs, rubs, gallops RESPIRATORY:  Clear to auscultation without rales, wheezing or rhonchi  ABDOMEN: Soft, non-tender, non-distended MUSCULOSKELETAL:  No edema; No deformity  SKIN: Warm and dry NEUROLOGIC:  Alert and oriented x 3 PSYCHIATRIC:  Normal affect   ASSESSMENT:    1. Paroxysmal atrial fibrillation (HCC)   2. Essential hypertension    PLAN:    In order of problems listed above:  Permanent atrial fibrillation -Rate controlled.  On Eliquis.  CHA2DS2-VASc score 4. -Appreciate Dr. Elberta Fortis assistance.  No need for loop recorder.  Continue with conservative management.  Chronic anticoagulation -Eliquis, hemoglobin 12.6 creatinine 1.1 ALT 18 TSH 4.4.  Stable.  No bleeding.  Left anterior fascicular block -Stable, no syncope  Essential hypertension -Toprol.  Continue to prescribe.  No changes made.   Medication Adjustments/Labs and Tests Ordered: Current medicines are reviewed at length with the patient today.  Concerns regarding medicines are outlined above.  Orders Placed This Encounter  Procedures  . EKG 12-Lead   No orders of the defined types were placed in this encounter.   Patient Instructions  Medication Instructions:  The current medical regimen is effective;  continue present plan and medications.  *If you need a refill on your cardiac medications before  your next appointment, please call your pharmacy*  Follow-Up: At [facility], you and your health needs are our priority.  As part of our continuing mission to provide you with exceptional heart care, we have created designated Provider Care Teams.  These Care Teams include your primary Cardiologist (physician) and Advanced Practice Providers (APPs -  Physician Assistants and Nurse Practitioners) who all work together to provide you with the care you need, when you need it.  We recommend signing up for the patient portal called "MyChart".  Sign up information is provided on this After Visit Summary.  MyChart is used to connect with patients for Virtual Visits (Telemedicine).  Patients are able to view lab/test results, encounter notes, upcoming  appointments, etc.  Non-urgent messages can be sent to your provider as well.   To learn more about what you can do with MyChart, go to [email or web address].    Your next appointment:   12 month(s)  The format for your next appointment:   In Person  Provider:   Donato Schultz, MD   Thank you for choosing [facility]!!        Signed, Donato Schultz, MD  09/06/2020 10:43 AM    [facility]

## 2020-09-26 ENCOUNTER — Other Ambulatory Visit: Payer: Self-pay | Admitting: Cardiology

## 2020-09-26 NOTE — Telephone Encounter (Signed)
Prescription refill request for Eliquis received.  Last office visit: Skains, 09/06/2020 Scr: 1.2, 09/08/2020 Age: 86 y.o. Weight: 58.1 kg   Prescription refill sent.

## 2020-10-17 ENCOUNTER — Other Ambulatory Visit: Payer: Self-pay | Admitting: Cardiology

## 2021-03-26 ENCOUNTER — Other Ambulatory Visit: Payer: Self-pay | Admitting: Cardiology

## 2021-03-26 NOTE — Telephone Encounter (Signed)
Pt's age 87, wt 58.1 kg, SCr 1.33, CrCl 23.72, last ov w/ MS 09/06/20.

## 2021-04-30 ENCOUNTER — Telehealth: Payer: Self-pay | Admitting: Cardiology

## 2021-04-30 MED ORDER — METOPROLOL SUCCINATE ER 25 MG PO TB24: 37.5000 mg | ORAL_TABLET | Freq: Every day | ORAL | 3 refills | Status: DC

## 2021-04-30 NOTE — Telephone Encounter (Signed)
Pt's medication has already been sent to pt's pharmacy as requested. Confirmation received.  

## 2021-04-30 NOTE — Telephone Encounter (Signed)
Pt's daughter called back to let our office know that the pts' PCP increased Metropolol medicine to 11/2 pills not Eliquis

## 2021-04-30 NOTE — Telephone Encounter (Signed)
duplicate

## 2021-04-30 NOTE — Telephone Encounter (Signed)
*  STAT* If patient is at the pharmacy, call can be transferred to refill team.   1. Which medications need to be refilled? (please list name of each medication and dose if known) metoprolol succinate (TOPROL-XL) 25 MG 24 hr tablet  2. Which pharmacy/location (including street and city if local pharmacy) is medication to be sent to? [facility] - [place], [place] - [address]  3. Do they need a 30 day or 90 day supply? 90 day supply

## 2021-04-30 NOTE — Telephone Encounter (Signed)
Pt c/o medication issue: 1. Name of Medication: Eliquis  2. How are you currently taking this medication (dosage and times per day)? 1 1/2 3. Are you having a reaction (difficulty breathing--STAT)?  No 4. What is your medication issue? Patient daughter states that Dr. Pete Glatter increased the patient medication to 1 1/2.

## 2021-04-30 NOTE — Telephone Encounter (Signed)
Outreach made to Pt's EC.  Request to refill Toprol XL 25 mg.    Per EC Dr. Pete Glatter increased Pt's dose to Toprol 37.5 mg by mouth daily.  Ok to refill at higher dose per Dr. Anne Fu.  Refill sent to [facility] as requested.

## 2021-05-20 ENCOUNTER — Other Ambulatory Visit: Payer: Self-pay

## 2021-05-20 ENCOUNTER — Emergency Department (HOSPITAL_COMMUNITY)
Admission: EM | Admit: 2021-05-20 | Discharge: 2021-05-20 | Disposition: A | Discharge status: Home / Self Care | Payer: Medicare Other | Attending: Emergency Medicine | Admitting: Emergency Medicine

## 2021-05-20 ENCOUNTER — Emergency Department (HOSPITAL_COMMUNITY): Payer: Medicare Other

## 2021-05-20 DIAGNOSIS — S0990XA Unspecified injury of head, initial encounter: Secondary | ICD-10-CM | POA: Insufficient documentation

## 2021-05-20 DIAGNOSIS — Z8679 Personal history of other diseases of the circulatory system: Secondary | ICD-10-CM | POA: Insufficient documentation

## 2021-05-20 DIAGNOSIS — Z79899 Other long term (current) drug therapy: Secondary | ICD-10-CM | POA: Insufficient documentation

## 2021-05-20 DIAGNOSIS — N182 Chronic kidney disease, stage 2 (mild): Secondary | ICD-10-CM | POA: Insufficient documentation

## 2021-05-20 DIAGNOSIS — W010XXA Fall on same level from slipping, tripping and stumbling without subsequent striking against object, initial encounter: Secondary | ICD-10-CM | POA: Insufficient documentation

## 2021-05-20 DIAGNOSIS — S41112A Laceration without foreign body of left upper arm, initial encounter: Secondary | ICD-10-CM | POA: Insufficient documentation

## 2021-05-20 DIAGNOSIS — Y9222 Religious institution as the place of occurrence of the external cause: Secondary | ICD-10-CM | POA: Insufficient documentation

## 2021-05-20 DIAGNOSIS — S0091XA Abrasion of unspecified part of head, initial encounter: Secondary | ICD-10-CM

## 2021-05-20 DIAGNOSIS — Z7901 Long term (current) use of anticoagulants: Secondary | ICD-10-CM | POA: Insufficient documentation

## 2021-05-20 DIAGNOSIS — S4992XA Unspecified injury of left shoulder and upper arm, initial encounter: Secondary | ICD-10-CM | POA: Diagnosis present

## 2021-05-20 DIAGNOSIS — S0081XA Abrasion of other part of head, initial encounter: Secondary | ICD-10-CM | POA: Diagnosis not present

## 2021-05-20 LAB — CBC WITH DIFFERENTIAL/PLATELET
Abs Immature Granulocytes: 0.02 10*3/uL (ref 0.00–0.07)
Basophils Absolute: 0 10*3/uL (ref 0.0–0.1)
Basophils Relative: 1 %
Eosinophils Absolute: 0.1 10*3/uL (ref 0.0–0.5)
Eosinophils Relative: 3 %
HCT: 41 % (ref 36.0–46.0)
Hemoglobin: 13.1 g/dL (ref 12.0–15.0)
Immature Granulocytes: 0 %
Lymphocytes Relative: 22 %
Lymphs Abs: 1.2 10*3/uL (ref 0.7–4.0)
MCH: 31.2 pg (ref 26.0–34.0)
MCHC: 32 g/dL (ref 30.0–36.0)
MCV: 97.6 fL (ref 80.0–100.0)
Monocytes Absolute: 0.6 10*3/uL (ref 0.1–1.0)
Monocytes Relative: 12 %
Neutro Abs: 3.3 10*3/uL (ref 1.7–7.7)
Neutrophils Relative %: 62 %
Platelets: 160 10*3/uL (ref 150–400)
RBC: 4.2 MIL/uL (ref 3.87–5.11)
RDW: 12.1 % (ref 11.5–15.5)
WBC: 5.3 10*3/uL (ref 4.0–10.5)
nRBC: 0 % (ref 0.0–0.2)

## 2021-05-20 LAB — PROTIME-INR
INR: 1.2 (ref 0.8–1.2)
Prothrombin Time: 15.4 seconds — ABNORMAL HIGH (ref 11.4–15.2)

## 2021-05-20 LAB — BASIC METABOLIC PANEL
Anion gap: 9 (ref 5–15)
BUN: 29 mg/dL — ABNORMAL HIGH (ref 8–23)
CO2: 24 mmol/L (ref 22–32)
Calcium: 9.5 mg/dL (ref 8.9–10.3)
Chloride: 106 mmol/L (ref 98–111)
Creatinine, Ser: 1.07 mg/dL — ABNORMAL HIGH (ref 0.44–1.00)
GFR, Estimated: 48 mL/min — ABNORMAL LOW (ref >60)
Glucose, Bld: 95 mg/dL (ref 70–99)
Potassium: 4.1 mmol/L (ref 3.5–5.1)
Sodium: 139 mmol/L (ref 135–145)

## 2021-05-20 MED ORDER — LIDOCAINE-EPINEPHRINE (PF) 2 %-1:200000 IJ SOLN
20.0000 mL | Freq: Once | INTRAMUSCULAR | Status: AC
  Administered 2021-05-20: 20 mL
  Filled 2021-05-20: qty 20

## 2021-05-20 NOTE — Discharge Instructions (Signed)
Take Tylenol as needed for pain.  Follow-up with your primary care doctor for wound recheck later this week.  Change dressing once a day or every other day.  Come back to ER for any additional falls, redness, swelling, drainage or other new concerning symptom.

## 2021-05-20 NOTE — ED Notes (Signed)
Pt BIB [facility] after falling after leaving church today. No med complaints of feeling dizzy prior to falling. Pt simply tripped on some rocks and fell forward. Pt has an abrasion to the L forehead, L forearm and L knee. Pt only complaining of knee pain. VSS. Pt is on eliquis for Afib.

## 2021-05-20 NOTE — ED Provider Notes (Signed)
[facility] EMERGENCY DEPARTMENT Provider Note   CSN: [id number] Arrival date & time: 05/20/21  0941     History Chief Complaint  Patient presents with  . Fall    Jessica Berg is a 87 y.o. female.  Presents to ER after fall.  Level 2 trauma.  History limited due to acuity.  Level 5 caveat.  Patient reports that she was leaving church and tripped on some gravel rocks and fell forward.  Denies loss of consciousness, no lightheadedness or dizziness.  States that she has some mild pain on her head and forearm.  Has been ambulatory since fall.  Has history of atrial fibrillation and takes 10 a inhibitor.  HPI     Past Medical History:  Diagnosis Date  . Atrial fibrillation (HCC)   . Chronic renal disease, stage II   . GERD (gastroesophageal reflux disease)   . Hypercholesteremia   . MR (mitral regurgitation)    , Mild  . Osteoarthritis   . Osteoporosis     Patient Active Problem List   Diagnosis Date Noted  . Paroxysmal atrial fibrillation (HCC) 09/09/2014  . Encounter for long-term (current) use of other medications 09/09/2014  . Tremor 09/09/2014  . A-fib (HCC) 03/09/2014  . Chronic anticoagulation 03/09/2014  . Tremulousness 03/09/2014  . Hypercholesteremia   . Atrial fibrillation (HCC)   . MR (mitral regurgitation)      OB History   No obstetric history on file.     Family History  Problem Relation Age of Onset  . Arthritis Mother   . Hypothyroidism Mother   . Heart attack Father     Social History   Tobacco Use  . Smoking status: Never Smoker  . Smokeless tobacco: Never Used  Substance Use Topics  . Alcohol use: No  . Drug use: No    Home Medications Prior to Admission medications   Medication Sig Start Date End Date Taking? Authorizing Provider  Calcium Carbonate-Vitamin D3 (CALCIUM 600-D) 600-400 MG-UNIT TABS Take 1 tablet by mouth daily.    [provider]  celecoxib (CELEBREX) 200 MG capsule Take 200 mg by  mouth daily.      [provider]  cholecalciferol (VITAMIN D) 1000 UNITS tablet Take 1,000 Units by mouth daily.      [provider]  dorzolamide-timolol (COSOPT) 22.3-6.8 MG/ML ophthalmic solution Place 1 drop into the right eye 2 (two) times daily. 08/16/20   [provider]  ELIQUIS 2.5 MG TABS tablet TAKE 1 TABLET TWICE A DAY 03/26/21   Jake Bathe, MD  latanoprost (XALATAN) 0.005 % ophthalmic solution Place 1 drop into both eyes daily. 08/12/19   [provider]  metoprolol succinate (TOPROL XL) 25 MG 24 hr tablet Take 1.5 tablets (37.5 mg total) by mouth daily. 04/30/21   Jake Bathe, MD  vitamin C (ASCORBIC ACID) 500 MG tablet Take 500 mg by mouth daily.      [provider]    Allergies    Darvocet [propoxyphene n-acetaminophen], Lodine [etodolac], Meprozine [meperidine-promethazine], Percocet [oxycodone-acetaminophen], Ultracet [tramadol-acetaminophen], Demerol, Hydrocodone, and Penicillins  Review of Systems   Review of Systems  Constitutional: Negative for chills and fever.  HENT: Negative for ear pain and sore throat.   Eyes: Negative for pain and visual disturbance.  Respiratory: Negative for cough and shortness of breath.   Cardiovascular: Negative for chest pain and palpitations.  Gastrointestinal: Negative for abdominal pain and vomiting.  Genitourinary: Negative for dysuria and hematuria.  Musculoskeletal:  Positive for arthralgias. Negative for back pain.  Skin: Negative for color change and rash.  Neurological: Positive for headaches. Negative for seizures and syncope.  All other systems reviewed and are negative.   Physical Exam Updated Vital Signs BP (!) 151/74   Pulse 67   Temp 97.7 F (36.5 C) (Oral)   Resp 15   Ht 5\' 5"  (1.651 m)   Wt 54.4 kg   SpO2 98%   BMI 19.97 kg/m   Physical Exam Vitals and nursing note reviewed.  Constitutional:      General: She is not in acute distress.    Appearance: She is  well-developed.  HENT:     Head: Normocephalic.     Comments: Superficial abrasion to forehead Eyes:     Conjunctiva/sclera: Conjunctivae normal.  Cardiovascular:     Rate and Rhythm: Normal rate and regular rhythm.     Heart sounds: No murmur heard.   Pulmonary:     Effort: Pulmonary effort is normal. No respiratory distress.     Breath sounds: Normal breath sounds.  Abdominal:     Palpations: Abdomen is soft.     Tenderness: There is no abdominal tenderness.  Musculoskeletal:     Cervical back: Neck supple.     Comments: Back: no C, T, L spine TTP, no step off or deformity RUE: no TTP throughout, no deformity, normal joint ROM, radial pulse intact, distal sensation and motor intact LUE: There is 2 cm laceration over the left forearm, no active bleeding, no tenderness to palpation throughout remainder of extremity, no other deformity, normal joint ROM, radial pulse intact, distal sensation and motor intact RLE:  no TTP throughout, no deformity, normal joint ROM, distal pulse, sensation and motor intact LLE: Some tenderness to the knee, normal joint ROM, distal pulse, sensation and motor intact  Skin:    General: Skin is warm and dry.  Neurological:     Mental Status: She is alert.     ED Results / Procedures / Treatments   Labs (all labs ordered are listed, but only abnormal results are displayed) Labs Reviewed  BASIC METABOLIC PANEL - Abnormal; Notable for the following components:      Result Value   BUN 29 (*)    Creatinine, Ser 1.07 (*)    GFR, Estimated 48 (*)    All other components within normal limits  PROTIME-INR - Abnormal; Notable for the following components:   Prothrombin Time 15.4 (*)    All other components within normal limits  CBC WITH DIFFERENTIAL/PLATELET    EKG None  Radiology DG Chest 1 View  Result Date: 05/20/2021 CLINICAL DATA:  Fall onto LEFT side EXAM: CHEST  1 VIEW COMPARISON:  11/23/2011 FINDINGS: Enlargement of cardiac silhouette.  Atherosclerotic calcification aorta. Mediastinal contours and pulmonary vascularity normal. Diffuse interstitial changes in the periphery of both lungs, could represent minimal pulmonary edema or interval development of peripheral interstitial lung disease. No segmental consolidation, pleural effusion, or pneumothorax. Diffuse osseous demineralization without acute bony findings. IMPRESSION: Enlargement of cardiac silhouette. Peripheral interstitial infiltrates of uncertain acuity. Aortic Atherosclerosis (ICD10-I70.0). Electronically Signed   By: 14/07/2011 M.D.   On: 05/20/2021 11:10   DG Forearm Left  Result Date: 05/20/2021 CLINICAL DATA:  LEFT side pain after fall, abrasions to LEFT arm EXAM: LEFT FOREARM - 2 VIEW COMPARISON:  None FINDINGS: Osseous demineralization. Elbow and wrist alignments normal. Extensive chondrocalcinosis at LEFT carpus question CPPD. No acute fracture, dislocation, or bone destruction. IMPRESSION: Osseous demineralization with  suspected CPPD at carpus. No acute osseous abnormalities. Electronically Signed   By: Ulyses Southward M.D.   On: 05/20/2021 11:09   CT Head Wo Contrast  Result Date: 05/20/2021 CLINICAL DATA:  Head and neck trauma, fell, frontal laceration, on blood thinners, normal mental status EXAM: CT HEAD WITHOUT CONTRAST CT CERVICAL SPINE WITHOUT CONTRAST TECHNIQUE: Multidetector CT imaging of the head and cervical spine was performed following the standard protocol without intravenous contrast. Multiplanar CT image reconstructions of the cervical spine were also generated. COMPARISON:  None FINDINGS: CT HEAD FINDINGS Brain: Generalized atrophy. Normal ventricular morphology. No midline shift or mass effect. Small vessel chronic ischemic changes of deep cerebral white matter. No intracranial hemorrhage, mass lesion, evidence of acute infarction, or extra-axial fluid collection. Vascular: No hyperdense vessels Skull: Demineralized but intact Sinuses/Orbits: Clear Other: N/A  CT CERVICAL SPINE FINDINGS Alignment: Minimal anterolisthesis C4-C5 and C7-T1. Remaining alignments normal. Skull base and vertebrae: Osseous demineralization. Degenerative changes at articulation between odontoid process and anterior arch of C1. Multilevel facet degenerative changes. Multilevel disc space narrowing and endplate spur formation greatest at C5-C6 and C6-C7. Vertebral body heights maintained. No fracture, additional subluxation, or bone destruction. Soft tissues and spinal canal: Prevertebral soft tissues normal thickness. Atherosclerotic calcifications at proximal great vessels. Disc levels:  No specific abnormalities. Upper chest: Lung apices clear Other: N/A IMPRESSION: Atrophy with small vessel chronic ischemic changes of deep cerebral white matter. No acute intracranial abnormalities. Multilevel degenerative disc and facet disease changes of the cervical spine. No acute cervical spine abnormalities. Electronically Signed   By: Ulyses Southward M.D.   On: 05/20/2021 10:20   CT Cervical Spine Wo Contrast  Result Date: 05/20/2021 CLINICAL DATA:  Head and neck trauma, fell, frontal laceration, on blood thinners, normal mental status EXAM: CT HEAD WITHOUT CONTRAST CT CERVICAL SPINE WITHOUT CONTRAST TECHNIQUE: Multidetector CT imaging of the head and cervical spine was performed following the standard protocol without intravenous contrast. Multiplanar CT image reconstructions of the cervical spine were also generated. COMPARISON:  None FINDINGS: CT HEAD FINDINGS Brain: Generalized atrophy. Normal ventricular morphology. No midline shift or mass effect. Small vessel chronic ischemic changes of deep cerebral white matter. No intracranial hemorrhage, mass lesion, evidence of acute infarction, or extra-axial fluid collection. Vascular: No hyperdense vessels Skull: Demineralized but intact Sinuses/Orbits: Clear Other: N/A CT CERVICAL SPINE FINDINGS Alignment: Minimal anterolisthesis C4-C5 and C7-T1. Remaining  alignments normal. Skull base and vertebrae: Osseous demineralization. Degenerative changes at articulation between odontoid process and anterior arch of C1. Multilevel facet degenerative changes. Multilevel disc space narrowing and endplate spur formation greatest at C5-C6 and C6-C7. Vertebral body heights maintained. No fracture, additional subluxation, or bone destruction. Soft tissues and spinal canal: Prevertebral soft tissues normal thickness. Atherosclerotic calcifications at proximal great vessels. Disc levels:  No specific abnormalities. Upper chest: Lung apices clear Other: N/A IMPRESSION: Atrophy with small vessel chronic ischemic changes of deep cerebral white matter. No acute intracranial abnormalities. Multilevel degenerative disc and facet disease changes of the cervical spine. No acute cervical spine abnormalities. Electronically Signed   By: Ulyses Southward M.D.   On: 05/20/2021 10:20   DG Knee Complete 4 Views Left  Result Date: 05/20/2021 CLINICAL DATA:  Fall onto LEFT side, abrasions LEFT knee EXAM: LEFT KNEE - COMPLETE 4+ VIEW COMPARISON:  None FINDINGS: Osseous demineralization. Diffuse joint space narrowing and minimal spur formation. Extensive chondrocalcinosis at question CPPD. No acute fracture, dislocation, or bone destruction. Minimal joint effusion. 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 No acute osseous abnormalities. Electronically Signed   By: Ulyses Southward M.D.   On: 05/20/2021 11:11    Procedures .Marland KitchenLaceration Repair  Date/Time: 05/21/2021 5:51 PM Performed by: Milagros Loll, MD Authorized by: Milagros Loll, MD   Consent:    Consent obtained:  Verbal   Consent given by:  Patient   Risks, benefits, and alternatives were discussed: yes     Risks discussed:  Infection, need for additional repair, nerve damage, poor wound healing, poor cosmetic result, retained foreign body, tendon damage and pain Universal protocol:    Immediately prior to  procedure, a time out was called: yes     Patient identity confirmed:  Verbally with patient Anesthesia:    Anesthesia method:  Local infiltration   Local anesthetic:  Lidocaine 2% WITH epi Laceration details:    Location:  Shoulder/arm   Shoulder/arm location:  L upper arm   Length (cm):  2 Exploration:    Hemostasis achieved with:  Direct pressure Treatment:    Area cleansed with:  Povidone-iodine   Amount of cleaning:  Extensive   Irrigation solution:  Sterile saline   Irrigation method:  Syringe Skin repair:    Repair method:  Sutures   Suture size:  4-0   Suture material:  Plain gut Approximation:    Approximation:  Close Post-procedure details:    Dressing:  Tube gauze (petroleum gauze)   Procedure completion:  Tolerated well, no immediate complications     Medications Ordered in ED Medications  lidocaine-EPINEPHrine (XYLOCAINE W/EPI) 2 %-1:200000 (PF) injection 20 mL (20 mLs Infiltration Given by Other 05/20/21 1031)    ED Course  I have reviewed the triage vital signs and the nursing notes.  Pertinent labs & imaging results that were available during my care of the patient were reviewed by me and considered in my medical decision making (see chart for details).    MDM Rules/Calculators/A&P                         87 year old lady presenting as a level 2 trauma due to head trauma on thinners.  Superficial abrasion to forehead, some tenderness over left knee and small laceration of her left forearm.  CT is negative, plain films negative, repaired at bedside the arm laceration.  Discharge home, follow-up with primary for wound recheck this coming week.  After the discussed management above, the patient was determined to be safe for discharge.  The patient was in agreement with this plan and all questions regarding their care were answered.  ED return precautions were discussed and the patient will return to the ED with any significant worsening of condition.    Final  Clinical Impression(s) / ED Diagnoses Final diagnoses:  Abrasion of head, initial encounter  Laceration of left upper extremity, initial encounter    Rx / DC Orders ED Discharge Orders    None       Milagros Loll, MD 05/21/21 1752

## 2021-05-20 NOTE — Progress Notes (Signed)
Chaplain responded to Trauma 2.  Pt. Alert and being attended by staff. First responded said "she took a fall as she came out of church.  She's a sweetheart. Her family was with her and is on the way." Nurse said no chaplain was needed at this time.  Will be available.  Rev. Lynnell Chad Pager [phone]

## 2021-07-18 ENCOUNTER — Emergency Department (HOSPITAL_BASED_OUTPATIENT_CLINIC_OR_DEPARTMENT_OTHER)
Admission: EM | Admit: 2021-07-18 | Discharge: 2021-07-18 | Disposition: A | Discharge status: Home / Self Care | Payer: Medicare Other | Attending: Emergency Medicine | Admitting: Emergency Medicine

## 2021-07-18 ENCOUNTER — Encounter (HOSPITAL_BASED_OUTPATIENT_CLINIC_OR_DEPARTMENT_OTHER): Payer: Self-pay | Admitting: Obstetrics and Gynecology

## 2021-07-18 ENCOUNTER — Emergency Department (HOSPITAL_BASED_OUTPATIENT_CLINIC_OR_DEPARTMENT_OTHER): Payer: Medicare Other

## 2021-07-18 ENCOUNTER — Other Ambulatory Visit: Payer: Self-pay

## 2021-07-18 DIAGNOSIS — Z7901 Long term (current) use of anticoagulants: Secondary | ICD-10-CM | POA: Diagnosis not present

## 2021-07-18 DIAGNOSIS — I4891 Unspecified atrial fibrillation: Secondary | ICD-10-CM | POA: Insufficient documentation

## 2021-07-18 DIAGNOSIS — S0990XA Unspecified injury of head, initial encounter: Secondary | ICD-10-CM | POA: Insufficient documentation

## 2021-07-18 DIAGNOSIS — N182 Chronic kidney disease, stage 2 (mild): Secondary | ICD-10-CM | POA: Insufficient documentation

## 2021-07-18 DIAGNOSIS — W01198A Fall on same level from slipping, tripping and stumbling with subsequent striking against other object, initial encounter: Secondary | ICD-10-CM | POA: Insufficient documentation

## 2021-07-18 LAB — CBC
HCT: 40.4 % (ref 36.0–46.0)
Hemoglobin: 12.9 g/dL (ref 12.0–15.0)
MCH: 31.5 pg (ref 26.0–34.0)
MCHC: 31.9 g/dL (ref 30.0–36.0)
MCV: 98.5 fL (ref 80.0–100.0)
Platelets: 163 10*3/uL (ref 150–400)
RBC: 4.1 MIL/uL (ref 3.87–5.11)
RDW: 12.4 % (ref 11.5–15.5)
WBC: 6.2 10*3/uL (ref 4.0–10.5)
nRBC: 0 % (ref 0.0–0.2)

## 2021-07-18 LAB — BASIC METABOLIC PANEL
Anion gap: 9 (ref 5–15)
BUN: 32 mg/dL — ABNORMAL HIGH (ref 8–23)
CO2: 26 mmol/L (ref 22–32)
Calcium: 9.5 mg/dL (ref 8.9–10.3)
Chloride: 109 mmol/L (ref 98–111)
Creatinine, Ser: 0.93 mg/dL (ref 0.44–1.00)
GFR, Estimated: 57 mL/min — ABNORMAL LOW (ref >60)
Glucose, Bld: 147 mg/dL — ABNORMAL HIGH (ref 70–99)
Potassium: 4.2 mmol/L (ref 3.5–5.1)
Sodium: 144 mmol/L (ref 135–145)

## 2021-07-18 NOTE — Discharge Instructions (Addendum)
Your history, exam, work-up today are consistent with head injury after your fall.  Fortunately, the CT head did not show any fracture or bleeding.  Please follow-up with your primary doctor.  If any symptoms change or worsen acutely please return to the nearest emergency department

## 2021-07-18 NOTE — ED Triage Notes (Signed)
Patient reports to the ER for fall and head injury.Patient is on eliquis. Denies LOC. Patient repotrs she heard a crack to the left side of her forehead.

## 2021-07-18 NOTE — ED Provider Notes (Signed)
[facility] EMERGENCY DEPT Provider Note   CSN: [id number] Arrival date & time: 07/18/21  2054     History Chief Complaint  Patient presents with   Jessica Berg    Jessica Berg is a 87 y.o. female.  The history is provided by the patient and medical records. No language interpreter was used.  Fall This is a new problem. The current episode started 1 to 2 hours ago. The problem occurs constantly. The problem has not changed since onset.Pertinent negatives include no chest pain, no abdominal pain, no headaches and no shortness of breath. Nothing aggravates the symptoms. She has tried nothing for the symptoms. The treatment provided no relief.      Past Medical History:  Diagnosis Date   Atrial fibrillation (HCC)    Chronic renal disease, stage II    GERD (gastroesophageal reflux disease)    Hypercholesteremia    MR (mitral regurgitation)    , Mild   Osteoarthritis    Osteoporosis     Patient Active Problem List   Diagnosis Date Noted   Paroxysmal atrial fibrillation (HCC) 09/09/2014   Encounter for long-term (current) use of other medications 09/09/2014   Tremor 09/09/2014   A-fib (HCC) 03/09/2014   Chronic anticoagulation 03/09/2014   Tremulousness 03/09/2014   Hypercholesteremia    Atrial fibrillation (HCC)    MR (mitral regurgitation)     Past Surgical History:  Procedure Laterality Date   BACK SURGERY     JOINT REPLACEMENT       OB History   No obstetric history on file.     Family History  Problem Relation Age of Onset   Arthritis Mother    Hypothyroidism Mother    Heart attack Father     Social History   Tobacco Use   Smoking status: Never   Smokeless tobacco: Never  Vaping Use   Vaping Use: Never used  Substance Use Topics   Alcohol use: No   Drug use: No    Home Medications Prior to Admission medications   Medication Sig Start Date End Date Taking? Authorizing Provider  Calcium Carbonate-Vitamin D3 (CALCIUM 600-D) 600-400  MG-UNIT TABS Take 1 tablet by mouth daily.    [provider]  celecoxib (CELEBREX) 200 MG capsule Take 200 mg by mouth daily.      [provider]  cholecalciferol (VITAMIN D) 1000 UNITS tablet Take 1,000 Units by mouth daily.      [provider]  dorzolamide-timolol (COSOPT) 22.3-6.8 MG/ML ophthalmic solution Place 1 drop into the right eye 2 (two) times daily. 08/16/20   [provider]  ELIQUIS 2.5 MG TABS tablet TAKE 1 TABLET TWICE A DAY 03/26/21   Jake Bathe, MD  latanoprost (XALATAN) 0.005 % ophthalmic solution Place 1 drop into both eyes daily. 08/12/19   [provider]  metoprolol succinate (TOPROL XL) 25 MG 24 hr tablet Take 1.5 tablets (37.5 mg total) by mouth daily. 04/30/21   Jake Bathe, MD  vitamin C (ASCORBIC ACID) 500 MG tablet Take 500 mg by mouth daily.      [provider]    Allergies    Darvocet [propoxyphene n-acetaminophen], Lodine [etodolac], Meprozine [meperidine-promethazine], Percocet [oxycodone-acetaminophen], Ultracet [tramadol-acetaminophen], Demerol, Hydrocodone, and Penicillins  Review of Systems   Review of Systems  Constitutional:  Negative for chills, diaphoresis, fatigue and fever.  HENT:  Negative for congestion.   Respiratory:  Negative for cough, chest tightness and shortness of breath.   Cardiovascular:  Negative for chest  pain and palpitations.  Gastrointestinal:  Negative for abdominal pain, constipation, diarrhea, nausea and vomiting.  Genitourinary:  Negative for flank pain.  Musculoskeletal:  Negative for back pain, neck pain and neck stiffness.  Skin:  Negative for rash and wound.  Neurological:  Negative for dizziness, light-headedness, numbness and headaches.  Psychiatric/Behavioral:  Negative for agitation.   All other systems reviewed and are negative.  Physical Exam Updated Vital Signs BP (!) 154/70   Pulse (!) 57   Temp 98.1 F (36.7 C)   Resp 18   SpO2 100%   Physical  Exam Vitals and nursing note reviewed.  Constitutional:      General: She is not in acute distress.    Appearance: She is well-developed. She is not ill-appearing, toxic-appearing or diaphoretic.  HENT:     Nose: No congestion or rhinorrhea.     Mouth/Throat:     Mouth: Mucous membranes are moist.     Pharynx: No oropharyngeal exudate or posterior oropharyngeal erythema.  Eyes:     Extraocular Movements: Extraocular movements intact.     Conjunctiva/sclera: Conjunctivae normal.     Pupils: Pupils are equal, round, and reactive to light.  Cardiovascular:     Rate and Rhythm: Normal rate and regular rhythm.     Heart sounds: No murmur heard. Pulmonary:     Effort: Pulmonary effort is normal. No respiratory distress.     Breath sounds: Normal breath sounds. No wheezing or rales.  Chest:     Chest wall: No tenderness.  Abdominal:     Palpations: Abdomen is soft.     Tenderness: There is no abdominal tenderness. There is no guarding or rebound.  Musculoskeletal:        General: No tenderness.     Cervical back: Neck supple.     Right lower leg: No edema.     Left lower leg: No edema.  Skin:    General: Skin is warm and dry.     Capillary Refill: Capillary refill takes less than 2 seconds.     Findings: No erythema.  Neurological:     General: No focal deficit present.     Mental Status: She is alert.     Sensory: No sensory deficit.     Motor: No weakness.  Psychiatric:        Mood and Affect: Mood normal.    ED Results / Procedures / Treatments   Labs (all labs ordered are listed, but only abnormal results are displayed) Labs Reviewed  BASIC METABOLIC PANEL - Abnormal; Notable for the following components:      Result Value   Glucose, Bld 147 (*)    BUN 32 (*)    GFR, Estimated 57 (*)    All other components within normal limits  CBC    EKG None  Radiology CT HEAD WO CONTRAST  Result Date: 07/18/2021 CLINICAL DATA:  Head trauma. EXAM: CT HEAD WITHOUT CONTRAST  TECHNIQUE: Contiguous axial images were obtained from the base of the skull through the vertex without intravenous contrast. COMPARISON:  Head CT dated 05/20/2021. FINDINGS: Brain: Mild age-related atrophy and chronic microvascular ischemic changes. There is no acute intracranial hemorrhage. No mass effect midline shift. No extra-axial fluid collection. Vascular: No hyperdense vessel or unexpected calcification. Skull: Normal. Negative for fracture or focal lesion. Sinuses/Orbits: No acute finding. Other: None IMPRESSION: 1. No acute intracranial pathology. 2. Mild age-related atrophy and chronic microvascular ischemic changes. Electronically Signed   By: Ceasar Mons.D.  On: 07/18/2021 21:46    Procedures Procedures   Medications Ordered in ED Medications - No data to display  ED Course  I have reviewed the triage vital signs and the nursing notes.  Pertinent labs & imaging results that were available during my care of the patient were reviewed by me and considered in my medical decision making (see chart for details).    MDM Rules/Calculators/A&P                           Jessica Berg is a 87 y.o. female with a past medical history significant for atrial fibrillation on Eliquis therapy, prior stroke, GERD, hypercholesterolemia, and mitral regurg who presents with a fall.  According to patient, she was walking down some stairs outside at her daughter's house when she tripped on a towel that was sitting on the ground causing her to fall.  She reports she had her left temple on the hard ground and heard a loud crack.  She reports some discomfort initially but it has improved since the fall.  She reports no nausea, vomiting, vision changes, or neurologic deficits.  She denies any numbness, tingling, weakness of extremities.  Denies any chest pain, abdominal pain, back pain, or other injuries.  Denies any bleeding or laceration.  Denies any neck pain.  On exam, lungs clear and chest  nontender.  Abdomen nontender.  Moving all extremities.  No laceration.  Minimal tenderness on the left temple site.  Pupils are symmetric with normal extraocular movements.  Clear speech.  Patient has no neck tenderness.  No evidence of other trauma.  Due to her head injury on Eliquis, will get CT head.  Given her lack of other complaints, we agreed to hold on further work-up at this time.    Anticipate discharge if imaging is reassuring.  CT scan was reassuring with no evidence of fracture or intracranial injury.  Patient was reassured and her exam was reassuring.  We discussed delayed bleeds and how she needs to follow-up with her primary doctor.  She agreed with plan of care and had no other questions or concerns.  Patient discharged in good condition after reassuring work-up.  Final Clinical Impression(s) / ED Diagnoses Final diagnoses:  Injury of head, initial encounter    Rx / DC Orders ED Discharge Orders     None       Clinical Impression: 1. Injury of head, initial encounter     Disposition: Discharge  Condition: Good  I have discussed the results, Dx and Tx plan with the pt(& family if present). He/she/they expressed understanding and agree(s) with the plan. Discharge instructions discussed at great length. Strict return precautions discussed and pt &/or family have verbalized understanding of the instructions. No further questions at time of discharge.    Discharge Medication List as of 07/18/2021 11:28 PM      Follow Up: Merlene Laughter, MD [address] [place] [address] [phone]     [facility] Emergency Dept [address] [place] [address] [phone]       Dak Szumski, Canary Brim, MD 07/19/21 [phone]

## 2021-07-19 ENCOUNTER — Ambulatory Visit (HOSPITAL_BASED_OUTPATIENT_CLINIC_OR_DEPARTMENT_OTHER): Payer: Medicare Other | Admitting: Cardiology

## 2021-09-18 ENCOUNTER — Ambulatory Visit (INDEPENDENT_AMBULATORY_CARE_PROVIDER_SITE_OTHER): Payer: Medicare Other | Admitting: Cardiology

## 2021-09-18 ENCOUNTER — Ambulatory Visit (INDEPENDENT_AMBULATORY_CARE_PROVIDER_SITE_OTHER): Payer: Medicare Other

## 2021-09-18 ENCOUNTER — Other Ambulatory Visit: Payer: Self-pay

## 2021-09-18 DIAGNOSIS — I4821 Permanent atrial fibrillation: Secondary | ICD-10-CM | POA: Diagnosis not present

## 2021-09-18 DIAGNOSIS — I10 Essential (primary) hypertension: Secondary | ICD-10-CM | POA: Diagnosis not present

## 2021-09-18 DIAGNOSIS — Z7901 Long term (current) use of anticoagulants: Secondary | ICD-10-CM

## 2021-09-18 NOTE — Patient Instructions (Signed)
Medication Instructions:  The current medical regimen is effective;  continue present plan and medications.  *If you need a refill on your cardiac medications before your next appointment, please call your pharmacy*  Testing/Procedures: [place] Monitor Instructions  Your physician has requested you wear a ZIO patch monitor for 14 days.  This is a single patch monitor. [employer] supplies one patch monitor per enrollment. Additional stickers are not available. Please do not apply patch if you will be having a Nuclear Stress Test,  Echocardiogram, Cardiac CT, MRI, or Chest Xray during the period you would be wearing the  monitor. The patch cannot be worn during these tests. You cannot remove and re-apply the  ZIO XT patch monitor.  Your ZIO patch monitor will be mailed 3 day USPS to your address on file. It may take 3-5 days  to receive your monitor after you have been enrolled.  Once you have received your monitor, please review the enclosed instructions. Your monitor  has already been registered assigning a specific monitor serial # to you.  Billing and Patient Assistance Program Information  We have supplied [employer] with any of your insurance information on file for billing purposes. [employer] offers a sliding scale Patient Assistance Program for patients that do not have  insurance, or whose insurance does not completely cover the cost of the ZIO monitor.  You must apply for the Patient Assistance Program to qualify for this discounted rate.  To apply, please call [employer] at [phone], select option 4, select option 2, ask to apply for  Patient Assistance Program. Theodore Demark will ask your household income, and how many people  are in your household. They will quote your out-of-pocket cost based on that information.  [employer] will also be able to set up a 24-month interest-free payment plan if needed.  Applying the monitor   Shave hair from upper left chest.  Hold abrader disc  by orange tab. Rub abrader in 40 strokes over the upper left chest as  indicated in your monitor instructions.  Clean area with 4 enclosed alcohol pads. Let dry.  Apply patch as indicated in monitor instructions. Patch will be placed under collarbone on left  side of chest with arrow pointing upward.  Rub patch adhesive wings for 2 minutes. Remove white label marked "1". Remove the white  label marked "2". Rub patch adhesive wings for 2 additional minutes.  While looking in a mirror, press and release button in center of patch. A small green light will  flash 3-4 times. This will be your only indicator that the monitor has been turned on.  Do not shower for the first 24 hours. You may shower after the first 24 hours.  Press the button if you feel a symptom. You will hear a small click. Record Date, Time and  Symptom in the Patient Logbook.  When you are ready to remove the patch, follow instructions on the last 2 pages of Patient  Logbook. Stick patch monitor onto the last page of Patient Logbook.  Place Patient Logbook in the blue and white box. Use locking tab on box and tape box closed  securely. The blue and white box has prepaid postage on it. Please place it in the mailbox as  soon as possible. Your physician should have your test results approximately 7 days after the  monitor has been mailed back to [facility]  Call [facility] [phone] you have questions regarding  your ZIO XT patch  monitor. Call them immediately if you see an orange light blinking on your  monitor.  If your monitor falls off in less than 4 days, contact our Monitor department at [phone].  If your monitor becomes loose or falls off after 4 days call [employer] at [phone] for  suggestions on securing your monitor  Follow-Up: At [facility], you and your health needs are our priority.  As part of our continuing mission to provide you with exceptional heart care, we have  created designated Provider Care Teams.  These Care Teams include your primary Cardiologist (physician) and Advanced Practice Providers (APPs -  Physician Assistants and Nurse Practitioners) who all work together to provide you with the care you need, when you need it.  We recommend signing up for the patient portal called "MyChart".  Sign up information is provided on this After Visit Summary.  MyChart is used to connect with patients for Virtual Visits (Telemedicine).  Patients are able to view lab/test results, encounter notes, upcoming appointments, etc.  Non-urgent messages can be sent to your provider as well.   To learn more about what you can do with MyChart, go to [email or web address].    Your next appointment:   6 month(s)  The format for your next appointment:   In Person Provider:   Donato Schultz, MD Donato Schultz, MD   Thank you for choosing [facility]!!

## 2021-09-18 NOTE — Progress Notes (Signed)
Cardiology Office Note:    Date:  09/18/2021   ID:  SALIA CANGEMI, DOB 1924/05/29, MRN [id number]  PCP:  Merlene Laughter, MD  [facility] Cardiologist:  Donato Schultz, MD  [facility] Electrophysiologist:  None   Referring MD: Merlene Laughter, MD   History of Present Illness:    Jessica Berg is a 87 y.o. female here for the follow-up of atrial fibrillation, syncope, mitral regurgitation, and hyperlipidemia.  Was seen by Dr. Elberta Fortis for syncope, AF.  Had a rushing feeling to her head, felt like she was going to pass out.  Has happened for multiple years.    She has since stopped driving.  Prism left eye glasses. Lives on her own.  Tries to do much on her own.  Has daughters for support.    Prior monitoring showed no episodes of bradycardia.  Today: Ms. Pollick had a mechanical fall tripping on gravel rocks and presented to the ED 05/20/2021. She had a superficial abrasion to her forehead and a small laceration on her left forearm. CT was negative. On 07/18/2021 she again presented to the ED after a mechanical fall. She tripped on a towel while climbing down outside stairs, and hit her left temple on the hard ground. She reported hearing a loud crack. CT scan was reassuring and she was discharged in good condition.  Overall, she is feeling okay. She is accompanied by her daughter. During her previous fall, she states that she "missed and step and then she was out."   She reports having episodes of severe pressure in her left UE, which causes her to feel lightheaded and near-syncopal. She believes this may be attributable to high blood pressure. When she was in the hospital her blood pressure was found to be elevated. Typically she does not check it at home as it is usually stable.  Endorses bilateral LE edema in her feet.  She denies any palpitations, chest pain, or shortness of breath. No headaches, orthopnea, or PND.   She was told that the double vision in her left eye is a  result of her previous stroke.  Since her last visit, her PCP slightly increased metoprolol.   Past Medical History:  Diagnosis Date   Atrial fibrillation (HCC)    Chronic renal disease, stage II    GERD (gastroesophageal reflux disease)    Hypercholesteremia    MR (mitral regurgitation)    , Mild   Osteoarthritis    Osteoporosis     Past Surgical History:  Procedure Laterality Date   BACK SURGERY     JOINT REPLACEMENT      Current Medications: Current Meds  Medication Sig   brimonidine (ALPHAGAN) 0.2 % ophthalmic solution Place 1 drop into the right eye 2 (two) times daily.   Calcium Carbonate-Vitamin D3 600-400 MG-UNIT TABS Take 1 tablet by mouth daily.   celecoxib (CELEBREX) 200 MG capsule Take 200 mg by mouth daily.     cholecalciferol (VITAMIN D) 1000 UNITS tablet Take 1,000 Units by mouth daily.     dorzolamide-timolol (COSOPT) 22.3-6.8 MG/ML ophthalmic solution Place 1 drop into the right eye 2 (two) times daily.   ELIQUIS 2.5 MG TABS tablet TAKE 1 TABLET TWICE A DAY   latanoprost (XALATAN) 0.005 % ophthalmic solution Place 1 drop into both eyes daily.   metoprolol succinate (TOPROL XL) 25 MG 24 hr tablet Take 1.5 tablets (37.5 mg total) by mouth daily.   vitamin C (ASCORBIC ACID) 500 MG tablet Take 500 mg by  mouth daily.       Allergies:   Darvocet [propoxyphene n-acetaminophen], Lodine [etodolac], Meprozine [meperidine-promethazine], Percocet [oxycodone-acetaminophen], Ultracet [tramadol-acetaminophen], Demerol, Hydrocodone, and Penicillins   Social History   Socioeconomic History   Marital status: Widowed    Spouse name: Not on file   Number of children: Not on file   Years of education: Not on file   Highest education level: Not on file  Occupational History   Not on file  Tobacco Use   Smoking status: Never   Smokeless tobacco: Never  Vaping Use   Vaping Use: Never used  Substance and Sexual Activity   Alcohol use: No   Drug use: No   Sexual  activity: Not on file  Other Topics Concern   Not on file  Social History Narrative   Not on file   Social Determinants of Health   Financial Resource Strain: Not on file  Food Insecurity: Not on file  Transportation Needs: Not on file  Physical Activity: Not on file  Stress: Not on file  Social Connections: Not on file     Family History: The patient's family history includes Arthritis in her mother; Heart attack in her father; Hypothyroidism in her mother.  ROS:   Please see the history of present illness.    (+) Left UE pressure/tightness (+) Lightheadedness (+) Near-syncope All other systems reviewed and are negative.  EKGs/Labs/Other Studies Reviewed:    CT Head 07/18/2021: FINDINGS: Brain: Mild age-related atrophy and chronic microvascular ischemic changes. There is no acute intracranial hemorrhage. No mass effect midline shift. No extra-axial fluid collection.   Vascular: No hyperdense vessel or unexpected calcification.   Skull: Normal. Negative for fracture or focal lesion.   Sinuses/Orbits: No acute finding.   Other: None   IMPRESSION: 1. No acute intracranial pathology. 2. Mild age-related atrophy and chronic microvascular ischemic changes.  Right LE Doppler 01/06/2006: Comparison: 02/21/04  Findings: The deep veins of the right lower extremity are compressible throughout their course and demonstrate normal blood flow with color Doppler. There is normal augmentation. No DVT are demonstrated.  IMPRESSION:  No evidence of acute right lower extremity DVT.   EKG:  EKG is personally reviewed and interpreted. 09/18/2021: Atrial fibrillation. Rate 54 bpm. 09/06/2020: atrial fibrillation left anterior fascicular block 73.  Recent Labs: 07/18/2021: BUN 32; Creatinine, Ser 0.93; Hemoglobin 12.9; Platelets 163; Potassium 4.2; Sodium 144  Recent Lipid Panel No results found for: CHOL, TRIG, HDL, CHOLHDL, VLDL, LDLCALC, LDLDIRECT  Physical Exam:     VS:  BP  138/82   Pulse (!) 54   Ht 5\' 5"  (1.651 m)   Wt 126 lb 9.6 oz (57.4 kg)   BMI 21.07 kg/m     Wt Readings from Last 3 Encounters:  09/18/21 126 lb 9.6 oz (57.4 kg)  05/20/21 120 lb (54.4 kg)  09/06/20 128 lb (58.1 kg)     GEN: Well nourished, well developed in no acute distress HEENT: Normal NECK: No JVD; No carotid bruits LYMPHATICS: No lymphadenopathy CARDIAC: Irregular, no murmurs, rubs, gallops RESPIRATORY:  Clear to auscultation without rales, wheezing or rhonchi  ABDOMEN: Soft, non-tender, non-distended MUSCULOSKELETAL:  No edema; No deformity  SKIN: Warm and dry NEUROLOGIC:  Alert and oriented x 3 PSYCHIATRIC:  Normal affect    ASSESSMENT:    1. Permanent atrial fibrillation (HCC)   2. Chronic anticoagulation   3. Essential hypertension     PLAN:    In order of problems listed above: Atrial fibrillation Heart rate 54  beats with atrial fibrillation today.  Under good control.  Recently her metoprolol was increased from 25-37.5 by Dr. Pete Glatter.  Agree.  She has had some issues with hypertension as well.  I am going to check a Zio patch monitor to ensure that she is not have any significant bradycardia/pauses.  She has had frequent falls and by historical account they sound mechanical.  However, I want to make sure this is not significant pauses.  Chronic anticoagulation Continue with dose adjusted Eliquis.  No bleeding episodes.  Hemoglobin reviewed.  Stable.  Essential hypertension In the emergency room when her blood pressure was highly elevated this is likely secondary to anxiety being in this situation.  I agree however with the mild increase in the Toprol.  Today, reasonable blood pressure.    Follow-up: 6 months.  Medication Adjustments/Labs and Tests Ordered: Current medicines are reviewed at length with the patient today.  Concerns regarding medicines are outlined above.   No orders of the defined types were placed in this encounter.  No orders of  the defined types were placed in this encounter.  There are no Patient Instructions on file for this visit.   I,Mathew Stumpf,acting as a Neurosurgeon for [employer], MD.,have documented all relevant documentation on the behalf of Donato Schultz, MD,as directed by  Donato Schultz, MD while in the presence of Donato Schultz, MD.  I, Donato Schultz, MD, have reviewed all documentation for this visit. The documentation on 09/18/21 for the exam, diagnosis, procedures, and orders are all accurate and complete.   Signed, Donato Schultz, MD  09/18/2021 9:32 AM    [facility]

## 2021-09-18 NOTE — Assessment & Plan Note (Signed)
Heart rate 54 beats with atrial fibrillation today.  Under good control.  Recently her metoprolol was increased from 25-37.5 by Dr. Pete Glatter.  Agree.  She has had some issues with hypertension as well.  I am going to check a Zio patch monitor to ensure that she is not have any significant bradycardia/pauses.  She has had frequent falls and by historical account they sound mechanical.  However, I want to make sure this is not significant pauses.

## 2021-09-18 NOTE — Assessment & Plan Note (Signed)
Continue with dose adjusted Eliquis.  No bleeding episodes.  Hemoglobin reviewed.  Stable.

## 2021-09-18 NOTE — Assessment & Plan Note (Signed)
In the emergency room when her blood pressure was highly elevated this is likely secondary to anxiety being in this situation.  I agree however with the mild increase in the Toprol.  Today, reasonable blood pressure.

## 2021-09-21 ENCOUNTER — Other Ambulatory Visit: Payer: Self-pay | Admitting: Cardiology

## 2021-09-21 NOTE — Telephone Encounter (Signed)
Pt last saw Dr Anne Fu 09/18/21, last labs 07/18/21 Creat 0.93, age 87, weight 57.4, based on specified criteria pt is on appropriate dosage of Eliquis 2.5mg  BID for afib.  Will refill rx.

## 2021-10-02 DIAGNOSIS — I4821 Permanent atrial fibrillation: Secondary | ICD-10-CM

## 2021-10-29 ENCOUNTER — Ambulatory Visit: Payer: Medicare Other | Admitting: Cardiology

## 2021-11-02 ENCOUNTER — Telehealth: Payer: Self-pay | Admitting: Cardiology

## 2021-11-02 NOTE — Telephone Encounter (Signed)
LMTCB

## 2021-11-02 NOTE — Telephone Encounter (Signed)
   Pt's daughter Jessica Berg returning call she said to call her back on this number [phone]

## 2021-11-02 NOTE — Telephone Encounter (Signed)
STAT if patient feels like he/she is going to faint   Are you dizzy now? no  Do you feel faint or have you passed out? no  Do you have any other symptoms? fatigue  Have you checked your HR and BP (record if available)? No   Patient's daughter states the patient has been very tired and has some episodes of lightheadedness. She says the patient has fallen asleep during conversations and feels very weak. She says the patient tries to drink orange juice or chocolate to help. She states the other day she called the patient at 8:30 am and she was still in bed, which is not like her. She says the patient is usually up at 5 or 6 am.

## 2021-11-02 NOTE — Telephone Encounter (Signed)
Per Marcelino Duster the pts daughter:   Patient's daughter states the patient has been very tired and has some episodes of lightheadedness. She says the patient has fallen asleep during conversations and feels very weak. She says the patient tries to drink orange juice or chocolate to help. She states the other day she called the patient at 8:30 am and she was still in bed, which is not like her. She says the patient is usually up at 5 or 6 am.    I spoke with her and she will talk with Dr. Pete Glatter about having a physical soon... she denies palpitations, chest pain.. she has been eating and drinking well... she lives alone so not sure how much she is eating... she will also find a way to check her BP and HR and let us know what it is running.   I will forward to Dr. Anne Fu for his review he is out of the office but hopefully she will see her PCP in between that time and be able to r/o some other possible causes.

## 2021-11-05 NOTE — Telephone Encounter (Signed)
Dizziness (Newest Message First) Jake Bathe, MD  You; Bertram Millard, RN 53 minutes ago (4:09 PM)   Agree with starting with Dr. Pete Glatter or his team  Donato Schultz, MD

## 2022-03-13 NOTE — Progress Notes (Signed)
?Cardiology Office Note:   ? ?Date:  03/14/2022  ? ?ID:  Jessica Berg, DOB Jun 25, 1924, MRN [id number] ? ?PCP:  Lajean Manes, MD  ?[facility] Cardiologist:  Candee Furbish, MD  ?[facility] Electrophysiologist:  None  ? ?Referring MD: Lajean Manes, MD  ? ?History of Present Illness:   ? ?Jessica Berg is a 88 y.o. female here for the follow-up of atrial fibrillation and hypertension. ? ?Since her last appointment her daughter called the office 11/02/21 reporting she had been very fatigued with episodes of lightheadedness. Was falling asleep during conversations and feeling very weak. She had tried drinking orange juice or eating chocolate. It was recommended to follow-up with her PCP. ? ?Was seen by Dr. Curt Bears for syncope, AF. ? ?Had a rushing feeling to her head, felt like she was going to pass out.  Has happened for multiple years.   ? ?She has since stopped driving.  Prism left eye glasses. ?Lives on her own.  Tries to do much on her own.  Has daughters for support.   ? ?Prior monitoring showed no episodes of bradycardia. ? ?Ms. Dundon had a mechanical fall tripping on gravel rocks and presented to the ED 05/20/2021. She had a superficial abrasion to her forehead and a small laceration on her left forearm. CT was negative. On 07/18/2021 she again presented to the ED after a mechanical fall. She tripped on a towel while climbing down outside stairs, and hit her left temple on the hard ground. She reported hearing a loud crack. CT scan was reassuring and she was discharged in good condition. ? ?At her last appointment she reported episodes of severe pressure in her LUE, associated with lightheadedness and near syncope. She felt this was due to high blood pressures. Also was told the double vision in her left eye is a result of her previous stroke. ? ?She is accompanied by her daughter, who also provides the history. Today, she states her heart is feeling wonderful. At this time she is not feeling any  recurrent LUE pressure. There have been no recurrent syncopal episodes. ? ?She is tolerating Eliquis and remains compliant. Recently she was started on multiple eye drops for treating glaucoma. ? ?She denies any palpitations, chest pain, shortness of breath, or peripheral edema. No lightheadedness, headaches, orthopnea, or PND. ? ?She is scheduled for a full physical exam in October 2023. ? ? ?Past Medical History:  ?Diagnosis Date  ? Atrial fibrillation ([place])   ? Chronic renal disease, stage II   ? GERD (gastroesophageal reflux disease)   ? Hypercholesteremia   ? MR (mitral regurgitation)   ? , Mild  ? Osteoarthritis   ? Osteoporosis   ? ? ?Past Surgical History:  ?Procedure Laterality Date  ? BACK SURGERY    ? JOINT REPLACEMENT    ? ? ?Current Medications: ?Current Meds  ?Medication Sig  ? apixaban (ELIQUIS) 2.5 MG TABS tablet TAKE 1 TABLET TWICE A DAY  ? brimonidine (ALPHAGAN) 0.2 % ophthalmic solution Place 1 drop into the right eye 2 (two) times daily.  ? Calcium Carbonate-Vitamin D3 600-400 MG-UNIT TABS Take 1 tablet by mouth daily.  ? celecoxib (CELEBREX) 200 MG capsule Take 200 mg by mouth daily.    ? cholecalciferol (VITAMIN D) 1000 UNITS tablet Take 1,000 Units by mouth daily.    ? dorzolamide-timolol (COSOPT) 22.3-6.8 MG/ML ophthalmic solution Place 1 drop into the right eye 2 (two) times daily.  ? latanoprost (XALATAN) 0.005 % ophthalmic solution Place 1  drop into both eyes daily.  ? metoprolol succinate (TOPROL XL) 25 MG 24 hr tablet Take 1.5 tablets (37.5 mg total) by mouth daily.  ? vitamin C (ASCORBIC ACID) 500 MG tablet Take 500 mg by mouth daily.    ?  ? ?Allergies:   Darvocet [propoxyphene n-acetaminophen], Lodine [etodolac], Meprozine [meperidine-promethazine], Percocet [oxycodone-acetaminophen], Ultracet [tramadol-acetaminophen], Demerol, Hydrocodone, and Penicillins  ? ?Social History  ? ?Socioeconomic History  ? Marital status: Widowed  ?  Spouse name: Not on file  ? Number of children: Not on  file  ? Years of education: Not on file  ? Highest education level: Not on file  ?Occupational History  ? Not on file  ?Tobacco Use  ? Smoking status: Never  ? Smokeless tobacco: Never  ?Vaping Use  ? Vaping Use: Never used  ?Substance and Sexual Activity  ? Alcohol use: No  ? Drug use: No  ? Sexual activity: Not on file  ?Other Topics Concern  ? Not on file  ?Social History Narrative  ? Not on file  ? ?Social Determinants of Health  ? ?Financial Resource Strain: Not on file  ?Food Insecurity: Not on file  ?Transportation Needs: Not on file  ?Physical Activity: Not on file  ?Stress: Not on file  ?Social Connections: Not on file  ?  ? ?Family History: ?The patient's family history includes Arthritis in her mother; Heart attack in her father; Hypothyroidism in her mother. ? ?ROS:   ?Please see the history of present illness.    ?All other systems reviewed and are negative. ? ?EKGs/Labs/Other Studies Reviewed:   ? ?Monitor 09/2021: ?Continuous atrial fibrillation throughout recording, 100% burden. Average heart rate was 67 bpm. Maximum heart rate 113. ?Bundle branch block was present. Rare PVCs. ?Average lowest heart rate 56 bpm. No significant pauses noted. ?  ?Overall reassuring monitor with no significant pauses or significant prolonged bradycardic episodes.  Atrial fibrillation noted throughout tracings. ? ?CT Head 07/18/2021: ?FINDINGS: ?Brain: Mild age-related atrophy and chronic microvascular ischemic ?changes. There is no acute intracranial hemorrhage. No mass effect ?midline shift. No extra-axial fluid collection. ?  ?Vascular: No hyperdense vessel or unexpected calcification. ?  ?Skull: Normal. Negative for fracture or focal lesion. ?  ?Sinuses/Orbits: No acute finding. ?  ?Other: None ?  ?IMPRESSION: ?1. No acute intracranial pathology. ?2. Mild age-related atrophy and chronic microvascular ischemic ?changes. ? ?Right LE Doppler 01/06/2006: ?Comparison: 02/21/04  ?Findings: The deep veins of the right lower  extremity are compressible throughout their course and demonstrate normal blood flow with color Doppler. There is normal augmentation. No DVT are demonstrated.  ?IMPRESSION:  ?No evidence of acute right lower extremity DVT.  ? ?EKG:  EKG is personally reviewed and interpreted. ?03/14/2022: EKG was not ordered. ?09/18/2021: Atrial fibrillation. Rate 54 bpm. ?09/06/2020: atrial fibrillation left anterior fascicular block 73. ? ?Recent Labs: ?07/18/2021: BUN 32; Creatinine, Ser 0.93; Hemoglobin 12.9; Platelets 163; Potassium 4.2; Sodium 144  ? ?Recent Lipid Panel ?No results found for: CHOL, TRIG, HDL, CHOLHDL, VLDL, LDLCALC, LDLDIRECT ? ?Physical Exam:   ? ? ?VS:  BP (!) 142/72   Pulse 82   Ht 5' (1.524 m)   Wt 124 lb (56.2 kg)   SpO2 99%   BMI 24.22 kg/m?    ? ?Wt Readings from Last 3 Encounters:  ?03/14/22 124 lb (56.2 kg)  ?09/18/21 126 lb 9.6 oz (57.4 kg)  ?05/20/21 120 lb (54.4 kg)  ?  ? ?GEN: Well nourished, well developed in no acute distress ?HEENT:  Normal ?NECK: No JVD; No carotid bruits ?LYMPHATICS: No lymphadenopathy ?CARDIAC: Irregularly irregular, no murmurs, rubs, gallops ?RESPIRATORY:  Clear to auscultation without rales, wheezing or rhonchi  ?ABDOMEN: Soft, non-tender, non-distended ?MUSCULOSKELETAL:  No edema; No deformity  ?SKIN: Warm and dry ?NEUROLOGIC:  Alert and oriented x 3 ?PSYCHIATRIC:  Normal affect  ? ? ?ASSESSMENT:   ? ?1. Permanent atrial fibrillation ([place])   ?2. Hypercoagulable state due to atrial fibrillation ([place])   ? ? ? ?PLAN:   ? ?In order of problems listed above: ? ?Permanent atrial fibrillation ([place]) ?Very well rate controlled.  Average heart rate 67 on October ZIO monitor personally reviewed.  Continuing with Eliquis 2.5 mg for anticoagulation.  No recent falls or syncope.  Continue with metoprolol.  Dosing as above. ? ?Hypercoagulable state due to atrial fibrillation ([place]) ?Eliquis 2.5 mg appropriately dosed.  No recent bleeding.  Prior hemoglobin 13 creatinine 1.09.  Repeating  lab work since it has been 6 months.  She will have a physical in October with Dr. Felipa Eth. ? ? ?Follow-up: 6 months. ? ?Medication Adjustments/Labs and Tests Ordered: ?Current medicines are reviewed at length

## 2022-03-14 ENCOUNTER — Encounter: Payer: Self-pay | Admitting: Cardiology

## 2022-03-14 ENCOUNTER — Ambulatory Visit (INDEPENDENT_AMBULATORY_CARE_PROVIDER_SITE_OTHER): Payer: Medicare Other | Admitting: Cardiology

## 2022-03-14 VITALS — BP 142/72 | HR 82 | Ht 60.0 in | Wt 124.0 lb

## 2022-03-14 DIAGNOSIS — D6869 Other thrombophilia: Secondary | ICD-10-CM | POA: Diagnosis not present

## 2022-03-14 DIAGNOSIS — I4891 Unspecified atrial fibrillation: Secondary | ICD-10-CM | POA: Diagnosis not present

## 2022-03-14 DIAGNOSIS — I4821 Permanent atrial fibrillation: Secondary | ICD-10-CM | POA: Diagnosis not present

## 2022-03-14 LAB — BASIC METABOLIC PANEL
BUN/Creatinine Ratio: 30 — ABNORMAL HIGH (ref 12–28)
BUN: 31 mg/dL (ref 10–36)
CO2: 29 mmol/L (ref 20–29)
Calcium: 10.2 mg/dL (ref 8.7–10.3)
Chloride: 105 mmol/L (ref 96–106)
Creatinine, Ser: 1.02 mg/dL — ABNORMAL HIGH (ref 0.57–1.00)
Glucose: 82 mg/dL (ref 70–99)
Potassium: 4.4 mmol/L (ref 3.5–5.2)
Sodium: 140 mmol/L (ref 134–144)
eGFR: 51 mL/min/{1.73_m2} — ABNORMAL LOW (ref >59)

## 2022-03-14 LAB — CBC
Hematocrit: 37.6 % (ref 34.0–46.6)
Hemoglobin: 12.7 g/dL (ref 11.1–15.9)
MCH: 31.5 pg (ref 26.6–33.0)
MCHC: 33.8 g/dL (ref 31.5–35.7)
MCV: 93 fL (ref 79–97)
Platelets: 157 10*3/uL (ref 150–450)
RBC: 4.03 x10E6/uL (ref 3.77–5.28)
RDW: 12.5 % (ref 11.7–15.4)
WBC: 5.3 10*3/uL (ref 3.4–10.8)

## 2022-03-14 NOTE — Patient Instructions (Signed)
Medication Instructions:  ? ?Your physician recommends that you continue on your current medications as directed. Please refer to the Current Medication list given to you today. ? ?*If you need a refill on your cardiac medications before your next appointment, please call your pharmacy* ? ? ?Lab Work:  CBC AND BMET TODAY  ? ? ?If you have labs (blood work) drawn today and your tests are completely normal, you will receive your results only by: ?MyChart Message (if you have MyChart) OR ?A paper copy in the mail ?If you have any lab test that is abnormal or we need to change your treatment, we will call you to review the results. ? ? ?Testing/Procedures: NONE ORDERED  TODAY ? ? ? ?Follow-Up: ?At [facility], you and your health needs are our priority.  As part of our continuing mission to provide you with exceptional heart care, we have created designated Provider Care Teams.  These Care Teams include your primary Cardiologist (physician) and Advanced Practice Providers (APPs -  Physician Assistants and Nurse Practitioners) who all work together to provide you with the care you need, when you need it. ? ?We recommend signing up for the patient portal called "MyChart".  Sign up information is provided on this After Visit Summary.  MyChart is used to connect with patients for Virtual Visits (Telemedicine).  Patients are able to view lab/test results, encounter notes, upcoming appointments, etc.  Non-urgent messages can be sent to your provider as well.   ?To learn more about what you can do with MyChart, go to [email or web address].   ? ?Your next appointment:   ?6 MONTHS ? ?The format for your next appointment:   ?In Person ? ?Provider:   ?Candee Furbish, MD /APP  ? ? ?Other Instructions ? ?

## 2022-03-14 NOTE — Assessment & Plan Note (Signed)
Eliquis 2.5 mg appropriately dosed.  No recent bleeding.  Prior hemoglobin 13 creatinine 1.09.  Repeating lab work since it has been 6 months.  She will have a physical in October with Dr. Felipa Eth. ?

## 2022-03-14 NOTE — Assessment & Plan Note (Signed)
Very well rate controlled.  Average heart rate 67 on October ZIO monitor personally reviewed.  Continuing with Eliquis 2.5 mg for anticoagulation.  No recent falls or syncope.  Continue with metoprolol.  Dosing as above. ?

## 2022-06-12 ENCOUNTER — Other Ambulatory Visit: Payer: Self-pay | Admitting: Cardiology

## 2022-06-20 ENCOUNTER — Telehealth: Payer: Self-pay | Admitting: Cardiology

## 2022-06-20 MED ORDER — APIXABAN 2.5 MG PO TABS: 2.5000 mg | ORAL_TABLET | Freq: Two times a day (BID) | ORAL | 0 refills | Status: DC

## 2022-06-20 NOTE — Telephone Encounter (Signed)
*  STAT* If patient is at the pharmacy, call can be transferred to refill team.   1. Which medications need to be refilled? (please list name of each medication and dose if known)   apixaban (ELIQUIS) 2.5 MG TABS tablet    2. Which pharmacy/location (including street and city if local pharmacy) is medication to be sent to? [id number] - [place], [place] - [address]  3. Do they need a 30 day or 90 day supply?  10 day - Pt is completely out, needs enough until mail order comes. Please advise

## 2022-06-20 NOTE — Telephone Encounter (Signed)
Eliquis 2.5mg  refill request received. Patient is 88 years old, weight-56.2kg, Crea-1.02 on 03/14/2022, Diagnosis-Afib, and last seen by Dr. Anne Fu on 03/14/2022. Dose is appropriate based on dosing criteria. Will send in refill to requested pharmacy.

## 2022-06-21 MED ORDER — APIXABAN 2.5 MG PO TABS: 2.5000 mg | ORAL_TABLET | Freq: Two times a day (BID) | ORAL | 1 refills | Status: AC

## 2022-06-21 NOTE — Addendum Note (Signed)
Addended by: Malena Peer D on: 06/21/2022 01:56 PM   Modules accepted: Orders

## 2022-06-21 NOTE — Telephone Encounter (Signed)
 *  STAT* If patient is at the pharmacy, call can be transferred to refill team.   1. Which medications need to be refilled? (please list name of each medication and dose if known) apixaban (ELIQUIS) 2.5 MG TABS tablet  2. Which pharmacy/location (including street and city if local pharmacy) is medication to be sent to? [facility] - [place], [place] - [address]  3. Do they need a 30 day or 90 day supply? 90 days   Needs 90 days refill sent to her mail order pharmacy [facility] not [facility]ill

## 2022-06-21 NOTE — Telephone Encounter (Signed)
[facility]

## 2022-09-03 ENCOUNTER — Observation Stay (HOSPITAL_COMMUNITY)
Admission: EM | Admit: 2022-09-03 | Discharge: 2022-09-06 | Disposition: A | Discharge status: Home Health | Payer: Medicare Other | Attending: Internal Medicine | Admitting: Internal Medicine

## 2022-09-03 ENCOUNTER — Emergency Department (HOSPITAL_COMMUNITY): Payer: Medicare Other

## 2022-09-03 ENCOUNTER — Encounter (HOSPITAL_COMMUNITY): Payer: Self-pay | Admitting: Emergency Medicine

## 2022-09-03 ENCOUNTER — Other Ambulatory Visit: Payer: Self-pay

## 2022-09-03 DIAGNOSIS — R55 Syncope and collapse: Secondary | ICD-10-CM | POA: Diagnosis not present

## 2022-09-03 DIAGNOSIS — Z7901 Long term (current) use of anticoagulants: Secondary | ICD-10-CM | POA: Insufficient documentation

## 2022-09-03 DIAGNOSIS — R531 Weakness: Secondary | ICD-10-CM

## 2022-09-03 DIAGNOSIS — R5381 Other malaise: Secondary | ICD-10-CM | POA: Diagnosis present

## 2022-09-03 DIAGNOSIS — I10 Essential (primary) hypertension: Secondary | ICD-10-CM | POA: Diagnosis not present

## 2022-09-03 DIAGNOSIS — R252 Cramp and spasm: Secondary | ICD-10-CM | POA: Diagnosis present

## 2022-09-03 DIAGNOSIS — Z79899 Other long term (current) drug therapy: Secondary | ICD-10-CM | POA: Insufficient documentation

## 2022-09-03 DIAGNOSIS — W07XXXA Fall from chair, initial encounter: Secondary | ICD-10-CM | POA: Insufficient documentation

## 2022-09-03 DIAGNOSIS — Y92009 Unspecified place in unspecified non-institutional (private) residence as the place of occurrence of the external cause: Secondary | ICD-10-CM | POA: Insufficient documentation

## 2022-09-03 DIAGNOSIS — I129 Hypertensive chronic kidney disease with stage 1 through stage 4 chronic kidney disease, or unspecified chronic kidney disease: Secondary | ICD-10-CM | POA: Diagnosis not present

## 2022-09-03 DIAGNOSIS — H409 Unspecified glaucoma: Secondary | ICD-10-CM | POA: Diagnosis not present

## 2022-09-03 DIAGNOSIS — W19XXXA Unspecified fall, initial encounter: Secondary | ICD-10-CM | POA: Diagnosis not present

## 2022-09-03 DIAGNOSIS — D696 Thrombocytopenia, unspecified: Secondary | ICD-10-CM | POA: Diagnosis not present

## 2022-09-03 DIAGNOSIS — N1832 Chronic kidney disease, stage 3b: Secondary | ICD-10-CM | POA: Diagnosis not present

## 2022-09-03 DIAGNOSIS — I4821 Permanent atrial fibrillation: Secondary | ICD-10-CM | POA: Diagnosis present

## 2022-09-03 LAB — CBC WITH DIFFERENTIAL/PLATELET
Abs Immature Granulocytes: 0.02 10*3/uL (ref 0.00–0.07)
Basophils Absolute: 0 10*3/uL (ref 0.0–0.1)
Basophils Relative: 1 %
Eosinophils Absolute: 0.1 10*3/uL (ref 0.0–0.5)
Eosinophils Relative: 2 %
HCT: 40.6 % (ref 36.0–46.0)
Hemoglobin: 13.1 g/dL (ref 12.0–15.0)
Immature Granulocytes: 0 %
Lymphocytes Relative: 13 %
Lymphs Abs: 0.9 10*3/uL (ref 0.7–4.0)
MCH: 31.9 pg (ref 26.0–34.0)
MCHC: 32.3 g/dL (ref 30.0–36.0)
MCV: 98.8 fL (ref 80.0–100.0)
Monocytes Absolute: 0.5 10*3/uL (ref 0.1–1.0)
Monocytes Relative: 7 %
Neutro Abs: 5.4 10*3/uL (ref 1.7–7.7)
Neutrophils Relative %: 77 %
Platelets: 147 10*3/uL — ABNORMAL LOW (ref 150–400)
RBC: 4.11 MIL/uL (ref 3.87–5.11)
RDW: 12.1 % (ref 11.5–15.5)
WBC: 7 10*3/uL (ref 4.0–10.5)
nRBC: 0 % (ref 0.0–0.2)

## 2022-09-03 LAB — COMPREHENSIVE METABOLIC PANEL
ALT: 15 U/L (ref 0–44)
AST: 30 U/L (ref 15–41)
Albumin: 3.7 g/dL (ref 3.5–5.0)
Alkaline Phosphatase: 79 U/L (ref 38–126)
Anion gap: 7 (ref 5–15)
BUN: 35 mg/dL — ABNORMAL HIGH (ref 8–23)
CO2: 26 mmol/L (ref 22–32)
Calcium: 9.4 mg/dL (ref 8.9–10.3)
Chloride: 109 mmol/L (ref 98–111)
Creatinine, Ser: 1.18 mg/dL — ABNORMAL HIGH (ref 0.44–1.00)
GFR, Estimated: 42 mL/min — ABNORMAL LOW (ref >60)
Glucose, Bld: 106 mg/dL — ABNORMAL HIGH (ref 70–99)
Potassium: 4 mmol/L (ref 3.5–5.1)
Sodium: 142 mmol/L (ref 135–145)
Total Bilirubin: 0.7 mg/dL (ref 0.3–1.2)
Total Protein: 6 g/dL — ABNORMAL LOW (ref 6.5–8.1)

## 2022-09-03 LAB — TROPONIN I (HIGH SENSITIVITY): Troponin I (High Sensitivity): 17 ng/L (ref <18)

## 2022-09-03 NOTE — ED Triage Notes (Signed)
Pt BIB EMS from home, lives alone. Was sitting at dining room table when she had syncopal episode. Pt states this happens when in a-fib. Pt crawled to phone to call EMS. C/o lightheadedness, denies pain/injury. Unknown if pt hit head, on eliquis.   EMS vitals:  168/92 HR 78 97% RA CBG 124

## 2022-09-03 NOTE — ED Notes (Signed)
Trauma Response Nurse Documentation   Jessica Berg is a 88 y.o. female arriving to [facility] ED via EMS  On Eliquis (apixaban) daily. Trauma was activated as a Level 2 by [place], Agricultural consultant based on the following trauma criteria Elderly patients > 65 with head trauma on anti-coagulation (excluding ASA). Trauma team at the bedside on patient arrival.   Patient cleared for CT by Dr. Wyvonnia Dusky. Pt transported to CT with trauma response nurse present to monitor. RN remained with the patient throughout their absence from the department for clinical observation.   GCS 15.  History   Past Medical History:  Diagnosis Date   Atrial fibrillation ([facility])    Chronic renal disease, stage II    GERD (gastroesophageal reflux disease)    Hypercholesteremia    MR (mitral regurgitation)    , Mild   Osteoarthritis    Osteoporosis      Past Surgical History:  Procedure Laterality Date   BACK SURGERY     JOINT REPLACEMENT         Initial Focused Assessment (If applicable, or please see trauma documentation): Airway-- intact Breathing-- spontaneous, unlabored Circulation-- no apparent bleeding noted  CT's Completed:   CT Head and CT C-Spine   Interventions:  Labs 18G PIV Left hand DG chest DG pelvis CT head CT c-spine  Plan for disposition:  Unknown at this time.  Consults completed:  none at 2229.  Event Summary: Patient brought in by [facility] from home, patient was sitting at dining table when she experienced a syncopal event and fell out of her chair, does not know if she struck her head. Patient alert and oriented x4 on arrival, GCS 15. Vital signs stable. Labs drawn, xray chest, pelvis completed. CT head, c-spine completed.   Bedside handoff with ED RN Caryl Pina.    Trudee Kuster  Trauma Response RN  Please call TRN at [phone] for further assistance.

## 2022-09-03 NOTE — Progress Notes (Signed)
Orthopedic Tech Progress Note Patient Details:  Jessica Berg Dec 12, 1924 [id number]  Patient ID: [name], female   DOB: 10-31-1924, 88 y.o.   MRN: [id number] Level 2 trauma not needed. [name] 09/03/2022, 11:10 PM

## 2022-09-03 NOTE — H&P (Signed)
Jessica Berg [id number] DOB: 04-29-1924 DOA: 09/03/2022     PCP: Lajean Manes, MD   Outpatient Specialists:   CARDS:   Dr.Skains    Patient arrived to ER on 09/03/22 at 2146 Referred by Attending Rancour, Annie Main, MD   Patient coming from:    home Lives alone   Chief Complaint:   Chief Complaint  Patient presents with   Fall    HPI: Jessica Berg is a 88 y.o. female with medical history significant of A.fib on eliquis, hypertension, CKD stage II, GERD, HLD, mitral regurgitation, osteoporosis     Presented with  syncope and fall she was sitting in a high chair had a "reaction" fell when she woke up her legs were too weak She denies actual true syncope, says she was just too weak all the sudden This happens occasionally  At baseline lives independently and can walk on her own power  No Cp no fever  Was sitting on a chair had sudden syncope no prodrome No CP Could not get help for 30 min On Eliquis for hx of a.fib Has been seen by EP in the past     She have had a monitor holter in October 2022 Continuous atrial fibrillation throughout recording, 100% burden. Average heart rate was 67 bpm. Maximum heart rate 113.  Bundle branch block was present. Rare PVCs.  Average lowest heart rate 56 bpm. No significant pauses noted.   Overall reassuring monitor with no significant pauses or significant prolonged bradycardic episodes.  Atrial fibrillation noted throughout tracings.  Regarding pertinent Chronic problems:       HTN on Toprol     A. Fib -    CHA2DS2 vas score   4      current  on anticoagulation with   Eliquis,           -  Rate control:  Currently controlled with  Toprolol,       CKD stage II- baseline Cr 1.2 Estimated Creatinine Clearance: 21.9 mL/min (A) (by C-G formula based on SCr of 1.18 mg/dL (H)).  Lab Results  Component Value Date   CREATININE 1.18 (H) 09/03/2022   CREATININE 1.02 (H) 03/14/2022   CREATININE 0.93 07/18/2021      While in ER:   Troponin 17   Ordered Pelvis -1. 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 2. Postoperative and degenerative changes within the lumbar spine.   CT HEAD/neck   NON acute  CXR - Stable cardiomegaly and chronic appearing increased interstitial lung markings without evidence of acute or active cardiopulmonary disease.   Following Medications were ordered in ER: Medications - No data to display  _______________________________________________________ ER Provider Called:   Cardiology   Dr.Khan They Recommend admit to medicine   Will see     ED Triage Vitals  Enc Vitals Group     BP 09/03/22 2150 128/78     Pulse Rate 09/03/22 2152 87     Resp 09/03/22 2152 20     Temp 09/03/22 2154 98.5 F (36.9 C)     Temp Source 09/03/22 2154 Oral     SpO2 09/03/22 2147 97 %     Weight 09/03/22 2151 123 lb 14.4 oz (56.2 kg)     Height 09/03/22 2151 5' (1.524 m)     Head Circumference --      Peak Flow --      Pain Score 09/03/22 2150 0     Pain Loc --  Pain Edu? --      Excl. in [place]? --   TMAX(24)@     _________________________________________ Significant initial  Findings: Abnormal Labs Reviewed  CBC WITH DIFFERENTIAL/PLATELET - Abnormal; Notable for the following components:      Result Value   Platelets 147 (*)    All other components within normal limits  COMPREHENSIVE METABOLIC PANEL - Abnormal; Notable for the following components:   Glucose, Bld 106 (*)    BUN 35 (*)    Creatinine, Ser 1.18 (*)    Total Protein 6.0 (*)    GFR, Estimated 42 (*)    All other components within normal limits     _________________________ Troponin 17 ECG: Ordered Personally reviewed and interpreted by me showing: HR : 79 Rhythm: Atrial fibrillation Left axis deviation Probable anteroseptal infarct, old Minimal ST depression, lateral leads No previous ECGs available QTC 448   The recent clinical data is shown below. Vitals:   09/03/22 2152  09/03/22 2153 09/03/22 2154 09/03/22 2200  BP: (!) 161/103   (!) 158/81  Pulse: 87 78  76  Resp: 20   16  Temp:   98.5 F (36.9 C)   TempSrc:   Oral   SpO2: 98% 99%  99%  Weight:      Height:         WBC     Component Value Date/Time   WBC 7.0 09/03/2022 2155   LYMPHSABS 0.9 09/03/2022 2155   MONOABS 0.5 09/03/2022 2155   EOSABS 0.1 09/03/2022 2155   BASOSABS 0.0 09/03/2022 2155      UA  ordered     No results found for this or any previous visit.   _______________________________________________ Hospitalist was called for admission for syncope   The following Work up has been ordered so far:  Orders Placed This Encounter  Procedures   DG Chest Portable 1 View   DG Pelvis Portable   CT Head Wo Contrast   CT Cervical Spine Wo Contrast   CBC with Differential   Comprehensive metabolic panel   Cardiac monitoring   Consult to hospitalist   Inpatient consult to Cardiology   Pulse oximetry, continuous   EKG 12-Lead   EKG 12-Lead     OTHER Significant initial  Findings:  labs showing:    Recent Labs  Lab 09/03/22 2235  NA 142  K 4.0  CO2 26  GLUCOSE 106*  BUN 35*  CREATININE 1.18*  CALCIUM 9.4    Cr   stable,    Lab Results  Component Value Date   CREATININE 1.18 (H) 09/03/2022   CREATININE 1.02 (H) 03/14/2022   CREATININE 0.93 07/18/2021    Recent Labs  Lab 09/03/22 2235  AST 30  ALT 15  ALKPHOS 79  BILITOT 0.7  PROT 6.0*  ALBUMIN 3.7   Lab Results  Component Value Date   CALCIUM 9.4 09/03/2022        Plt: Lab Results  Component Value Date   PLT 147 (L) 09/03/2022     Recent Labs  Lab 09/03/22 2155  WBC 7.0  NEUTROABS 5.4  HGB 13.1  HCT 40.6  MCV 98.8  PLT 147*    HG/HCT stable,       Component Value Date/Time   HGB 13.1 09/03/2022 2155   HGB 12.7 03/14/2022 0929   HCT 40.6 09/03/2022 2155   HCT 37.6 03/14/2022 0929   MCV 98.8 09/03/2022 2155   MCV 93 03/14/2022 0929      Cardiac Panel (  last 3  results) Recent Labs    09/04/22 0057  CKTOTAL 207        Cultures: No results found for: "SDES", "SPECREQUEST", "CULT", "REPTSTATUS"   Radiological Exams on Admission: CT Head Wo Contrast  Result Date: 09/03/2022 CLINICAL DATA:  Head trauma, moderate-severe; Neck trauma (Age >= 65y). No contrast. Pt BIB EMS from home, lives alone. Was sitting at dining room table when she had syncopal episode. Pt states this happens when in a-fib. Pt crawled to phone to call EMS. C/o lightheadedness, denies pain/injury. EXAM: CT HEAD WITHOUT CONTRAST CT CERVICAL SPINE WITHOUT CONTRAST TECHNIQUE: Multidetector CT imaging of the head and cervical spine was performed following the standard protocol without intravenous contrast. Multiplanar CT image reconstructions of the cervical spine were also generated. RADIATION DOSE REDUCTION: This exam was performed according to the departmental dose-optimization program which includes automated exposure control, adjustment of the mA and/or kV according to patient size and/or use of iterative reconstruction technique. COMPARISON:  Chest x-ray 09/03/2022 FINDINGS: CT HEAD FINDINGS BRAIN: BRAIN Cerebral ventricle sizes are concordant with the degree of cerebral volume loss. Patchy and confluent areas of decreased attenuation are noted throughout the deep and periventricular white matter of the cerebral hemispheres bilaterally, compatible with chronic microvascular ischemic disease. No evidence of large-territorial acute infarction. No parenchymal hemorrhage. No mass lesion. No extra-axial collection. No mass effect or midline shift. No hydrocephalus. Basilar cisterns are patent. Vascular: No hyperdense vessel. Skull: No acute fracture or focal lesion. Sinuses/Orbits: Paranasal sinuses and mastoid air cells are clear. Bilateral lens replacement. Otherwise the orbits are unremarkable. Other: None. CT CERVICAL SPINE FINDINGS Alignment: Grade 1 anterolisthesis of C3 on C4. Skull base and  vertebrae: Multilevel moderate severe degenerative changes spine. No associated severe osseous neural foraminal stenosis. No acute fracture. No aggressive appearing focal osseous lesion or focal pathologic process. Soft tissues and spinal canal: No prevertebral fluid or swelling. No visible canal hematoma. Upper chest: Diffuse bronchial wall thickening. Periapical pleural/pulmonary scarring. Other: Atherosclerotic plaque of the aortic arch and its branches. IMPRESSION: 1. No acute intracranial abnormality. 2. No acute displaced fracture or traumatic listhesis of the cervical spine. 3.  Aortic Atherosclerosis (ICD10-I70.0). 4. Small airway disease. Electronically Signed   By: Iven Finn M.D.   On: 09/03/2022 22:34   CT Cervical Spine Wo Contrast  Result Date: 09/03/2022 CLINICAL DATA:  Head trauma, moderate-severe; Neck trauma (Age >= 65y). No contrast. Pt BIB EMS from home, lives alone. Was sitting at dining room table when she had syncopal episode. Pt states this happens when in a-fib. Pt crawled to phone to call EMS. C/o lightheadedness, denies pain/injury. EXAM: CT HEAD WITHOUT CONTRAST CT CERVICAL SPINE WITHOUT CONTRAST TECHNIQUE: Multidetector CT imaging of the head and cervical spine was performed following the standard protocol without intravenous contrast. Multiplanar CT image reconstructions of the cervical spine were also generated. RADIATION DOSE REDUCTION: This exam was performed according to the departmental dose-optimization program which includes automated exposure control, adjustment of the mA and/or kV according to patient size and/or use of iterative reconstruction technique. COMPARISON:  Chest x-ray 09/03/2022 FINDINGS: CT HEAD FINDINGS BRAIN: BRAIN Cerebral ventricle sizes are concordant with the degree of cerebral volume loss. Patchy and confluent areas of decreased attenuation are noted throughout the deep and periventricular white matter of the cerebral hemispheres bilaterally,  compatible with chronic microvascular ischemic disease. No evidence of large-territorial acute infarction. No parenchymal hemorrhage. No mass lesion. No extra-axial collection. No mass effect or midline shift. No hydrocephalus. Basilar  cisterns are patent. Vascular: No hyperdense vessel. Skull: No acute fracture or focal lesion. Sinuses/Orbits: Paranasal sinuses and mastoid air cells are clear. Bilateral lens replacement. Otherwise the orbits are unremarkable. Other: None. CT CERVICAL SPINE FINDINGS Alignment: Grade 1 anterolisthesis of C3 on C4. Skull base and vertebrae: Multilevel moderate severe degenerative changes spine. No associated severe osseous neural foraminal stenosis. No acute fracture. No aggressive appearing focal osseous lesion or focal pathologic process. Soft tissues and spinal canal: No prevertebral fluid or swelling. No visible canal hematoma. Upper chest: Diffuse bronchial wall thickening. Periapical pleural/pulmonary scarring. Other: Atherosclerotic plaque of the aortic arch and its branches. IMPRESSION: 1. No acute intracranial abnormality. 2. No acute displaced fracture or traumatic listhesis of the cervical spine. 3.  Aortic Atherosclerosis (ICD10-I70.0). 4. Small airway disease. Electronically Signed   By: Iven Finn M.D.   On: 09/03/2022 22:34   DG Pelvis Portable  Result Date: 09/03/2022 CLINICAL DATA:  Syncope. EXAM: PORTABLE PELVIS 1-2 VIEWS COMPARISON:  None Available. FINDINGS: There is no evidence of an acute pelvic fracture or diastasis. No pelvic bone lesions are seen. Moderate severity degenerative changes are seen involving the bilateral hips in the form of joint space narrowing and acetabular sclerosis. Postoperative and degenerative changes seen within the visualized portion of the lumbar spine. IMPRESSION: 1. 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 2. Postoperative and degenerative changes within the lumbar spine. Electronically Signed   By:  Virgina Norfolk M.D.   On: 09/03/2022 22:19   DG Chest Portable 1 View  Result Date: 09/03/2022 CLINICAL DATA:  Status post fall. EXAM: PORTABLE CHEST 1 VIEW COMPARISON:  May 20, 2021 FINDINGS: The cardiac silhouette is moderately enlarged and unchanged in size. There is moderate severity calcification of the aortic arch. Diffuse, chronic appearing increased interstitial lung markings are seen. There is no evidence of acute infiltrate, pleural effusion or pneumothorax. Multilevel degenerative changes are seen throughout the thoracic spine. IMPRESSION: Stable cardiomegaly and chronic appearing increased interstitial lung markings without evidence of acute or active cardiopulmonary disease. Electronically Signed   By: Virgina Norfolk M.D.   On: 09/03/2022 22:17   _______________________________________________________________________________________________________ Latest  Blood pressure (!) 158/81, pulse 76, temperature 98.5 F (36.9 C), temperature source Oral, resp. rate 16, height 5' (1.524 m), weight 56.2 kg, SpO2 99 %.   Vitals  labs and radiology finding personally reviewed  Review of Systems:    Pertinent positives include:   fatigue, fall  Constitutional:  No weight loss, night sweats, Fevers, chills,weight loss  HEENT:  No headaches, Difficulty swallowing,Tooth/dental problems,Sore throat,  No sneezing, itching, ear ache, nasal congestion, post nasal drip,  Cardio-vascular:  No chest pain, Orthopnea, PND, anasarca, dizziness, palpitations.no Bilateral lower extremity swelling  GI:  No heartburn, indigestion, abdominal pain, nausea, vomiting, diarrhea, change in bowel habits, loss of appetite, melena, blood in stool, hematemesis Resp:  no shortness of breath at rest. No dyspnea on exertion, No excess mucus, no productive cough, No non-productive cough, No coughing up of blood.No change in color of mucus.No wheezing. Skin:  no rash or lesions. No jaundice GU:  no dysuria, change  in color of urine, no urgency or frequency. No straining to urinate.  No flank pain.  Musculoskeletal:  No joint pain or no joint swelling. No decreased range of motion. No back pain.  Psych:  No change in mood or affect. No depression or anxiety. No memory loss.  Neuro: no localizing neurological complaints, no tingling, no weakness, no double vision, no gait  abnormality, no slurred speech, no confusion  All systems reviewed and apart from [place] all are negative _______________________________________________________________________________________________ Past Medical History:   Past Medical History:  Diagnosis Date   Atrial fibrillation (HCC)    Chronic renal disease, stage II    GERD (gastroesophageal reflux disease)    Hypercholesteremia    MR (mitral regurgitation)    , Mild   Osteoarthritis    Osteoporosis       Past Surgical History:  Procedure Laterality Date   BACK SURGERY     JOINT REPLACEMENT      Social History:  Ambulatory   independently        reports that she has never smoked. She has never used smokeless tobacco. She reports that she does not drink alcohol and does not use drugs.     Family History:   Family History  Problem Relation Age of Onset   Arthritis Mother    Hypothyroidism Mother    Heart attack Father    ______________________________________________________________________________________________ Allergies: Allergies  Allergen Reactions   Darvocet [Propoxyphene N-Acetaminophen] Nausea And Vomiting   Lodine [Etodolac]     Unknown to patient   Meprozine [Meperidine-Promethazine]     Reaction=tongue swelling   Percocet [Oxycodone-Acetaminophen]    Ultracet [Tramadol-Acetaminophen] Nausea And Vomiting   Demerol Nausea And Vomiting   Hydrocodone Rash   Penicillins Rash     Prior to Admission medications   Medication Sig Start Date End Date Taking? Authorizing Provider  apixaban (ELIQUIS) 2.5 MG TABS tablet Take 1 tablet (2.5 mg  total) by mouth 2 (two) times daily. 06/21/22   Jerline Pain, MD  brimonidine (ALPHAGAN) 0.2 % ophthalmic solution Place 1 drop into the right eye 2 (two) times daily. 08/01/21   [provider]  Calcium Carbonate-Vitamin D3 600-400 MG-UNIT TABS Take 1 tablet by mouth daily.    [provider]  celecoxib (CELEBREX) 200 MG capsule Take 200 mg by mouth daily.      [provider]  cholecalciferol (VITAMIN D) 1000 UNITS tablet Take 1,000 Units by mouth daily.      [provider]  dorzolamide-timolol (COSOPT) 22.3-6.8 MG/ML ophthalmic solution Place 1 drop into the right eye 2 (two) times daily. 08/16/20   [provider]  latanoprost (XALATAN) 0.005 % ophthalmic solution Place 1 drop into both eyes daily. 08/12/19   [provider]  metoprolol succinate (TOPROL-XL) 25 MG 24 hr tablet TAKE ONE AND ONE-HALF TABLETS DAILY 06/12/22   Jerline Pain, MD  vitamin C (ASCORBIC ACID) 500 MG tablet Take 500 mg by mouth daily.      [provider]    ___________________________________________________________________________________________________ Physical Exam:    09/03/2022   10:00 PM 09/03/2022    9:53 PM 09/03/2022    9:52 PM  Vitals with BMI  Systolic 0000000  [id number]  Diastolic 81  [id number]  Pulse 76 78 87     1. General:  in No  Acute distress   Chronically ill   -appearing 2. Psychological: Alert and   Oriented 3. Head/ENT:    Dry Mucous Membranes                          Head Non traumatic, neck supple                           Poor Dentition 4. SKIN: decreased Skin turgor,  Skin clean Dry and intact no  rash 5. Heart: Regular rate and rhythm no  Murmur, no Rub or gallop 6. Lungs:  , no wheezes or crackles   7. Abdomen: Soft,  non-tender, Non distended   obese  bowel sounds present 8. Lower extremities: no clubbing, cyanosis, no  edema 9. Neurologically Grossly intact, moving all 4 extremities equally   Bilateral thighs tender to  palpation 10. MSK: Normal range of motion    Chart has been reviewed  ______________________________________________________________________________________________  Assessment/Plan 88 y.o. female with medical history significant of A.fib on eliquis, hypertension, CKD stage II, GERD, HLD, mitral regurgitation, osteoporosis    Admitted for pre-syncope, fall    Present on Admission:  Syncope  Permanent atrial fibrillation (HCC)  Essential hypertension  Leg cramps  Debility     Syncope Cycle cardiac enzymes check orthostatics obtain echogram appreciate cardiology consult  Permanent atrial fibrillation (HCC) Continue Eliquis at 2.5 mg twice a day Continue Toprol 37.5 mg p.o. daily  Essential hypertension Continue Toprol at 37.5 mg p.o. daily  Leg cramps Order robaxin, check electrolytes  Debility Pt OT eval in AM    Other plan as per orders.  DVT prophylaxis:  eliquis    Code Status:  DNR/DNI   as per patient   I had personally discussed CODE STATUS with patient and family     Family Communication:   Family  at  Bedside  plan of care was discussed with   Daughter,   Disposition Plan:       To home once workup is complete and patient is stable   Following barriers for discharge:                                                      Will need consultants to evaluate patient prior to discharge                       Would benefit from PT/OT eval prior to DC  Ordered                                       Consults called: Cardiology is aware  Admission status:  ED Disposition     ED Disposition  Admit   Condition  --   Comment  [facility] [[id number][facility]  Level of Care: Telemetry Cardiac [[id number]]  [facility] if equivalent level of care is available:: No  Covid Evaluation: Asymptomatic - no recent exposure (last 10 days) testing not required  Diagnosis: Syncope [206001]  Admitting  Physician: Therisa DoyneUTOVA, Loghan Subia [[id number]]  Attending Physician: Therisa DoyneUTOVA, Andru Genter [[id number]]           Obs      Level of care     tele  For   24H      Lexianna Weinrich 09/03/2022, 1:55 AM    [facility]     after 2 AM please page floor coverage PA If 7AM-7PM, please contact the day team taking care of the patient using [email or web address]   Patient was evaluated in the context of the global COVID-19 pandemic, which necessitated consideration that the patient might be at risk for infection with the SARS-CoV-2 virus that causes COVID-19.  Institutional protocols and algorithms that pertain to the evaluation of patients at risk for COVID-19 are in a state of rapid change based on information released by regulatory bodies including the CDC and federal and state organizations. These policies and algorithms were followed during the patient's care.

## 2022-09-03 NOTE — Subjective & Objective (Signed)
Was sitting on a chair had sudden syncope no prodrome No CP Could not get help for 30 min On Eliquis for hx of a.fib Has been seen by EP in the past

## 2022-09-03 NOTE — ED Provider Notes (Signed)
[facility] EMERGENCY DEPARTMENT Provider Note   CSN: [id number] Arrival date & time: 09/03/22  2146     History {Add pertinent medical, surgical, social history, OB history to HPI:1} Chief Complaint  Patient presents with   Jessica Berg is a 88 y.o. female.  Patient arrives as a level 2 trauma.  She had a syncopal episode and fell off of a barstool.  Does not know if she hit her head.  Was crawling around on the ground and not able to get up.  States this happened sometime when her "A-fib acts up".  Patient reports sitting on the barstool began to feel dizzy and lightheaded and next thing she knows she was on the ground.  Does not know if she hit her head.  Had to crawl on the ground was not able to get up.  Denies any chest pain or shortness of breath.  Denies any neck or back pain.  Denies any abdominal pain, nausea or vomiting.  No focal weakness, numbness or tingling.  She does take Eliquis for atrial fibrillation. States she has passed out previously to the atrial fibrillation.  Chart review shows patient has been seen by cardiology as well as electrophysiology in the past for syncopal episodes.  She describes feeling of "her body shutting down" and a wave rushing over her.  She has had echocardiograms and a Zio patch that showed permanent atrial fibrillation without any other arrhythmias.  States she has had these episodes recurrently for several years  The history is provided by the patient.  Fall Pertinent negatives include no abdominal pain.       Home Medications Prior to Admission medications   Medication Sig Start Date End Date Taking? Authorizing Provider  apixaban (ELIQUIS) 2.5 MG TABS tablet Take 1 tablet (2.5 mg total) by mouth 2 (two) times daily. 06/21/22   Jerline Pain, MD  brimonidine (ALPHAGAN) 0.2 % ophthalmic solution Place 1 drop into the right eye 2 (two) times daily. 08/01/21   [provider]  Calcium  Carbonate-Vitamin D3 600-400 MG-UNIT TABS Take 1 tablet by mouth daily.    [provider]  celecoxib (CELEBREX) 200 MG capsule Take 200 mg by mouth daily.      [provider]  cholecalciferol (VITAMIN D) 1000 UNITS tablet Take 1,000 Units by mouth daily.      [provider]  dorzolamide-timolol (COSOPT) 22.3-6.8 MG/ML ophthalmic solution Place 1 drop into the right eye 2 (two) times daily. 08/16/20   [provider]  latanoprost (XALATAN) 0.005 % ophthalmic solution Place 1 drop into both eyes daily. 08/12/19   [provider]  metoprolol succinate (TOPROL-XL) 25 MG 24 hr tablet TAKE ONE AND ONE-HALF TABLETS DAILY 06/12/22   Jerline Pain, MD  vitamin C (ASCORBIC ACID) 500 MG tablet Take 500 mg by mouth daily.      [provider]      Allergies    Darvocet [propoxyphene n-acetaminophen], Lodine [etodolac], Meprozine [meperidine-promethazine], Percocet [oxycodone-acetaminophen], Ultracet [tramadol-acetaminophen], Demerol, Hydrocodone, and Penicillins    Review of Systems   Review of Systems  Gastrointestinal:  Negative for abdominal pain, nausea and vomiting.  Musculoskeletal:  Positive for arthralgias and myalgias.  Skin:  Negative for rash.  Neurological:  Positive for syncope.   all other systems are negative except as noted in the HPI and PMH.    Physical Exam Updated Vital Signs BP (!) 161/103   Pulse 78   Temp  98.5 F (36.9 C) (Oral)   Resp 20   Ht 5' (1.524 m)   Wt 56.2 kg   SpO2 99%   BMI 24.20 kg/m  Physical Exam Vitals and nursing note reviewed.  Constitutional:      General: She is not in acute distress.    Appearance: She is well-developed.     Comments: No evidence of head injury.  HENT:     Head: Normocephalic and atraumatic.     Mouth/Throat:     Pharynx: No oropharyngeal exudate.  Eyes:     Conjunctiva/sclera: Conjunctivae normal.     Pupils: Pupils are equal, round, and reactive to light.  Neck:      Comments: No midline C spine tenderness Cardiovascular:     Rate and Rhythm: Normal rate. Rhythm irregular.     Heart sounds: Normal heart sounds. No murmur heard. Pulmonary:     Effort: Pulmonary effort is normal. No respiratory distress.     Breath sounds: Normal breath sounds.  Chest:     Chest wall: No tenderness.  Abdominal:     Palpations: Abdomen is soft.     Tenderness: There is no abdominal tenderness. There is no guarding or rebound.  Musculoskeletal:        General: No tenderness. Normal range of motion.     Cervical back: Normal range of motion and neck supple.     Comments: Pelvis stable.  Stiffness with range of motion of bilateral knees.  Skin:    General: Skin is warm.  Neurological:     Mental Status: She is alert and oriented to person, place, and time.     Cranial Nerves: No cranial nerve deficit.     Motor: No abnormal muscle tone.     Coordination: Coordination normal.     Comments:  5/5 strength throughout. CN 2-12 intact.Equal grip strength.   Psychiatric:        Behavior: Behavior normal.     ED Results / Procedures / Treatments   Labs (all labs ordered are listed, but only abnormal results are displayed) Labs Reviewed  CBC WITH DIFFERENTIAL/PLATELET - Abnormal; Notable for the following components:      Result Value   Platelets 147 (*)    All other components within normal limits  COMPREHENSIVE METABOLIC PANEL - Abnormal; Notable for the following components:   Glucose, Bld 106 (*)    BUN 35 (*)    Creatinine, Ser 1.18 (*)    Total Protein 6.0 (*)    GFR, Estimated 42 (*)    All other components within normal limits  URINALYSIS, ROUTINE W REFLEX MICROSCOPIC  TROPONIN I (HIGH SENSITIVITY)  TROPONIN I (HIGH SENSITIVITY)    EKG EKG Interpretation  Date/Time:  Tuesday September 03 2022 21:54:24 EDT Ventricular Rate:  79 PR Interval:    QRS Duration: 104 QT Interval:  388 QTC Calculation: 445 R Axis:   -36 Text Interpretation: Atrial  fibrillation Left axis deviation Probable anteroseptal infarct, old Minimal ST depression, lateral leads No previous ECGs available Confirmed by Ezequiel Essex [phone]) on 09/03/2022 9:57:03 PM  Radiology CT Head Wo Contrast  Result Date: 09/03/2022 CLINICAL DATA:  Head trauma, moderate-severe; Neck trauma (Age >= 65y). No contrast. Pt BIB EMS from home, lives alone. Was sitting at dining room table when she had syncopal episode. Pt states this happens when in a-fib. Pt crawled to phone to call EMS. C/o lightheadedness, denies pain/injury. EXAM: CT HEAD WITHOUT CONTRAST CT CERVICAL SPINE WITHOUT CONTRAST TECHNIQUE: Multidetector  CT imaging of the head and cervical spine was performed following the standard protocol without intravenous contrast. Multiplanar CT image reconstructions of the cervical spine were also generated. RADIATION DOSE REDUCTION: This exam was performed according to the departmental dose-optimization program which includes automated exposure control, adjustment of the mA and/or kV according to patient size and/or use of iterative reconstruction technique. COMPARISON:  Chest x-ray 09/03/2022 FINDINGS: CT HEAD FINDINGS BRAIN: BRAIN Cerebral ventricle sizes are concordant with the degree of cerebral volume loss. Patchy and confluent areas of decreased attenuation are noted throughout the deep and periventricular white matter of the cerebral hemispheres bilaterally, compatible with chronic microvascular ischemic disease. No evidence of large-territorial acute infarction. No parenchymal hemorrhage. No mass lesion. No extra-axial collection. No mass effect or midline shift. No hydrocephalus. Basilar cisterns are patent. Vascular: No hyperdense vessel. Skull: No acute fracture or focal lesion. Sinuses/Orbits: Paranasal sinuses and mastoid air cells are clear. Bilateral lens replacement. Otherwise the orbits are unremarkable. Other: None. CT CERVICAL SPINE FINDINGS Alignment: Grade 1 anterolisthesis  of C3 on C4. Skull base and vertebrae: Multilevel moderate severe degenerative changes spine. No associated severe osseous neural foraminal stenosis. No acute fracture. No aggressive appearing focal osseous lesion or focal pathologic process. Soft tissues and spinal canal: No prevertebral fluid or swelling. No visible canal hematoma. Upper chest: Diffuse bronchial wall thickening. Periapical pleural/pulmonary scarring. Other: Atherosclerotic plaque of the aortic arch and its branches. IMPRESSION: 1. No acute intracranial abnormality. 2. No acute displaced fracture or traumatic listhesis of the cervical spine. 3.  Aortic Atherosclerosis (ICD10-I70.0). 4. Small airway disease. Electronically Signed   By: Iven Finn M.D.   On: 09/03/2022 22:34   CT Cervical Spine Wo Contrast  Result Date: 09/03/2022 CLINICAL DATA:  Head trauma, moderate-severe; Neck trauma (Age >= 65y). No contrast. Pt BIB EMS from home, lives alone. Was sitting at dining room table when she had syncopal episode. Pt states this happens when in a-fib. Pt crawled to phone to call EMS. C/o lightheadedness, denies pain/injury. EXAM: CT HEAD WITHOUT CONTRAST CT CERVICAL SPINE WITHOUT CONTRAST TECHNIQUE: Multidetector CT imaging of the head and cervical spine was performed following the standard protocol without intravenous contrast. Multiplanar CT image reconstructions of the cervical spine were also generated. RADIATION DOSE REDUCTION: This exam was performed according to the departmental dose-optimization program which includes automated exposure control, adjustment of the mA and/or kV according to patient size and/or use of iterative reconstruction technique. COMPARISON:  Chest x-ray 09/03/2022 FINDINGS: CT HEAD FINDINGS BRAIN: BRAIN Cerebral ventricle sizes are concordant with the degree of cerebral volume loss. Patchy and confluent areas of decreased attenuation are noted throughout the deep and periventricular white matter of the cerebral  hemispheres bilaterally, compatible with chronic microvascular ischemic disease. No evidence of large-territorial acute infarction. No parenchymal hemorrhage. No mass lesion. No extra-axial collection. No mass effect or midline shift. No hydrocephalus. Basilar cisterns are patent. Vascular: No hyperdense vessel. Skull: No acute fracture or focal lesion. Sinuses/Orbits: Paranasal sinuses and mastoid air cells are clear. Bilateral lens replacement. Otherwise the orbits are unremarkable. Other: None. CT CERVICAL SPINE FINDINGS Alignment: Grade 1 anterolisthesis of C3 on C4. Skull base and vertebrae: Multilevel moderate severe degenerative changes spine. No associated severe osseous neural foraminal stenosis. No acute fracture. No aggressive appearing focal osseous lesion or focal pathologic process. Soft tissues and spinal canal: No prevertebral fluid or swelling. No visible canal hematoma. Upper chest: Diffuse bronchial wall thickening. Periapical pleural/pulmonary scarring. Other: Atherosclerotic plaque of the aortic arch  and its branches. IMPRESSION: 1. No acute intracranial abnormality. 2. No acute displaced fracture or traumatic listhesis of the cervical spine. 3.  Aortic Atherosclerosis (ICD10-I70.0). 4. Small airway disease. Electronically Signed   By: Iven Finn M.D.   On: 09/03/2022 22:34   DG Pelvis Portable  Result Date: 09/03/2022 CLINICAL DATA:  Syncope. EXAM: PORTABLE PELVIS 1-2 VIEWS COMPARISON:  None Available. FINDINGS: There is no evidence of an acute pelvic fracture or diastasis. No pelvic bone lesions are seen. Moderate severity degenerative changes are seen involving the bilateral hips in the form of joint space narrowing and acetabular sclerosis. Postoperative and degenerative changes seen within the visualized portion of the lumbar spine. IMPRESSION: 1. 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 2. Postoperative and degenerative changes within the lumbar spine.  Electronically Signed   By: Virgina Norfolk M.D.   On: 09/03/2022 22:19   DG Chest Portable 1 View  Result Date: 09/03/2022 CLINICAL DATA:  Status post fall. EXAM: PORTABLE CHEST 1 VIEW COMPARISON:  May 20, 2021 FINDINGS: The cardiac silhouette is moderately enlarged and unchanged in size. There is moderate severity calcification of the aortic arch. Diffuse, chronic appearing increased interstitial lung markings are seen. There is no evidence of acute infiltrate, pleural effusion or pneumothorax. Multilevel degenerative changes are seen throughout the thoracic spine. IMPRESSION: Stable cardiomegaly and chronic appearing increased interstitial lung markings without evidence of acute or active cardiopulmonary disease. Electronically Signed   By: Virgina Norfolk M.D.   On: 09/03/2022 22:17    Procedures Procedures  {Document cardiac monitor, telemetry assessment procedure when appropriate:1}  Medications Ordered in ED Medications - No data to display  ED Course/ Medical Decision Making/ A&P                           Medical Decision Making Amount and/or Complexity of Data Reviewed Labs: ordered. Decision-making details documented in ED Course. Radiology: ordered and independent interpretation performed. Decision-making details documented in ED Course. ECG/medicine tests: ordered and independent interpretation performed. Decision-making details documented in ED Course.   Syncopal episode falling backwards, unknown if she hit her head.  Arrives as level 2 trauma.  She is awake and alert.  She remembers feeling dizzy and lightheaded and think she passed out when she is done previously.  No chest pain or shortness of breath.   Exam is nonfocal.  EKG shows fibrillation.  No acute ST elevation.  Syncope without prodrome is concerning.  Patient does have rate controlled atrial fibrillation.  History of the same with echocardiogram and Zio patch previously without clear diagnosis.  Traumatic  imaging today is reassuring.  CT head and C-spine are negative for acute traumatic injury.  Results reviewed and interpreted by me.  Electrolytes are reassuring.  Cardiology records reviewed.  No traumatic injury from fall today.  There is concern for syncope without prodrome.  Discussed with Dr. Roel Cluck of internal medicine who request cardiology consultation. {Document critical care time when appropriate:1} {Document review of labs and clinical decision tools ie heart score, Chads2Vasc2 etc:1}  {Document your independent review of radiology images, and any outside records:1} {Document your discussion with family members, caretakers, and with consultants:1} {Document social determinants of health affecting pt's care:1} {Document your decision making why or why not admission, treatments were needed:1} Final Clinical Impression(s) / ED Diagnoses Final diagnoses:  None    Rx / DC Orders ED Discharge Orders     None

## 2022-09-03 NOTE — H&P (Incomplete)
Jessica Berg YQM:[id number] DOB: [birth year] DOA: 09/03/2022     PCP: Merlene Laughter, MD   Outpatient Specialists:   CARDS:   Dr.Skains    Patient arrived to ER on 09/03/22 at 2146 Referred by Attending Rancour, Jeannett Senior, MD   Patient coming from:    home Lives alone   Chief Complaint:   Chief Complaint  Patient presents with  . Fall    HPI: Jessica Berg is a 88 y.o. female with medical history significant of A.fib on eliquis, hypertension, CKD stage II, GERD, HLD, mitral regurgitation, osteoporosis    Presented with  syncope and fall Was sitting on a chair had sudden syncope no prodrome No CP Could not get help for 30 min On Eliquis for hx of a.fib Has been seen by EP in the past      Regarding pertinent Chronic problems:       HTN on Toprol     A. Fib -    CHA2DS2 vas score   4      current  on anticoagulation with   Eliquis,           -  Rate control:  Currently controlled with  Toprolol,       CKD stage II- baseline Cr 1.2 Estimated Creatinine Clearance: 21.9 mL/min (A) (by C-G formula based on SCr of 1.18 mg/dL (H)).  Lab Results  Component Value Date   CREATININE 1.18 (H) 09/03/2022   CREATININE 1.02 (H) 03/14/2022   CREATININE 0.93 07/18/2021     While in ER:   Troponin 17    Ordered Pelvis -1. 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 2. Postoperative and degenerative changes within the lumbar spine.   CT HEAD/neck   NON acute  CXR - Stable cardiomegaly and chronic appearing increased interstitial lung markings without evidence of acute or active cardiopulmonary disease.   Following Medications were ordered in ER: Medications - No data to display  _______________________________________________________ ER Provider Called:   Cardiology   Dr.Khan They Recommend admit to medicine   Will see     ED Triage Vitals  Enc Vitals Group     BP 09/03/22 2150 128/78     Pulse Rate 09/03/22 2152 87     Resp  09/03/22 2152 20     Temp 09/03/22 2154 98.5 F (36.9 C)     Temp Source 09/03/22 2154 Oral     SpO2 09/03/22 2147 97 %     Weight 09/03/22 2151 123 lb 14.4 oz (56.2 kg)     Height 09/03/22 2151 5' (1.524 m)     Head Circumference --      Peak Flow --      Pain Score 09/03/22 2150 0     Pain Loc --      Pain Edu? --      Excl. in GC? --   TMAX(24)@     _________________________________________ Significant initial  Findings: Abnormal Labs Reviewed  CBC WITH DIFFERENTIAL/PLATELET - Abnormal; Notable for the following components:      Result Value   Platelets 147 (*)    All other components within normal limits  COMPREHENSIVE METABOLIC PANEL - Abnormal; Notable for the following components:   Glucose, Bld 106 (*)    BUN 35 (*)    Creatinine, Ser 1.18 (*)    Total Protein 6.0 (*)    GFR, Estimated 42 (*)    All other components within normal limits     _________________________  Troponin 17 ECG: Ordered Personally reviewed and interpreted by me showing: HR : 79 Rhythm: Atrial fibrillation Left axis deviation Probable anteroseptal infarct, old Minimal ST depression, lateral leads No previous ECGs available QTC 448   The recent clinical data is shown below. Vitals:   09/03/22 2152 09/03/22 2153 09/03/22 2154 09/03/22 2200  BP: (!) 161/103   (!) 158/81  Pulse: 87 78  76  Resp: 20   16  Temp:   98.5 F (36.9 C)   TempSrc:   Oral   SpO2: 98% 99%  99%  Weight:      Height:         WBC     Component Value Date/Time   WBC 7.0 09/03/2022 2155   LYMPHSABS 0.9 09/03/2022 2155   MONOABS 0.5 09/03/2022 2155   EOSABS 0.1 09/03/2022 2155   BASOSABS 0.0 09/03/2022 2155     Lactic Acid, Venous No results found for: "LATICACIDVEN"   Procalcitonin *** Ordered Lactic Acid, Venous No results found for: "LATICACIDVEN"   Procalcitonin *** Ordered   UA *** no evidence of UTI  ***Pending ***not ordered   Urine analysis:    Component Value Date/Time   COLORURINE  YELLOW 11/23/2011 [place] 11/23/2011 1422   LABSPEC 1.011 11/23/2011 1422   PHURINE 6.5 11/23/2011 1422   GLUCOSEU NEGATIVE 11/23/2011 1422   HGBUR NEGATIVE 11/23/2011 1422   [place] 11/23/2011 1422   KETONESUR NEGATIVE 11/23/2011 1422   PROTEINUR NEGATIVE 11/23/2011 1422   UROBILINOGEN 0.2 11/23/2011 1422   NITRITE NEGATIVE 11/23/2011 1422   LEUKOCYTESUR MODERATE (A) 11/23/2011 1422    No results found for this or any previous visit.   _______________________________________________ Hospitalist was called for admission for syncope   The following Work up has been ordered so far:  Orders Placed This Encounter  Procedures  . DG Chest Portable 1 View  . DG Pelvis Portable  . CT Head Wo Contrast  . CT Cervical Spine Wo Contrast  . CBC with Differential  . Comprehensive metabolic panel  . Cardiac monitoring  . Consult to hospitalist  . Inpatient consult to Cardiology  . Pulse oximetry, continuous  . EKG 12-Lead  . EKG 12-Lead     OTHER Significant initial  Findings:  labs showing:    Recent Labs  Lab 09/03/22 2235  NA 142  K 4.0  CO2 26  GLUCOSE 106*  BUN 35*  CREATININE 1.18*  CALCIUM 9.4    Cr   stable,    Lab Results  Component Value Date   CREATININE 1.18 (H) 09/03/2022   CREATININE 1.02 (H) 03/14/2022   CREATININE 0.93 07/18/2021    Recent Labs  Lab 09/03/22 2235  AST 30  ALT 15  ALKPHOS 79  BILITOT 0.7  PROT 6.0*  ALBUMIN 3.7   Lab Results  Component Value Date   CALCIUM 9.4 09/03/2022        Plt: Lab Results  Component Value Date   PLT 147 (L) 09/03/2022     Recent Labs  Lab 09/03/22 2155  WBC 7.0  NEUTROABS 5.4  HGB 13.1  HCT 40.6  MCV 98.8  PLT 147*    HG/HCT stable,       Component Value Date/Time   HGB 13.1 09/03/2022 2155   HGB 12.7 03/14/2022 0929   HCT 40.6 09/03/2022 2155   HCT 37.6 03/14/2022 0929   MCV 98.8 09/03/2022 2155   MCV 93 03/14/2022 0929      Cardiac Panel  (  last 3 results) No results for input(s): "CKTOTAL", "CKMB", "TROPONINI", "RELINDX" in the last 72 hours.  .car BNP (last 3 results) No results for input(s): "BNP" in the last 8760 hours.    DM  labs:  HbA1C: No results for input(s): "HGBA1C" in the last 8760 hours.     CBG (last 3)  No results for input(s): "GLUCAP" in the last 72 hours.        Cultures: No results found for: "SDES", "SPECREQUEST", "CULT", "REPTSTATUS"   Radiological Exams on Admission: CT Head Wo Contrast  Result Date: 09/03/2022 CLINICAL DATA:  Head trauma, moderate-severe; Neck trauma (Age >= 65y). No contrast. Pt BIB EMS from home, lives alone. Was sitting at dining room table when she had syncopal episode. Pt states this happens when in a-fib. Pt crawled to phone to call EMS. C/o lightheadedness, denies pain/injury. EXAM: CT HEAD WITHOUT CONTRAST CT CERVICAL SPINE WITHOUT CONTRAST TECHNIQUE: Multidetector CT imaging of the head and cervical spine was performed following the standard protocol without intravenous contrast. Multiplanar CT image reconstructions of the cervical spine were also generated. RADIATION DOSE REDUCTION: This exam was performed according to the departmental dose-optimization program which includes automated exposure control, adjustment of the mA and/or kV according to patient size and/or use of iterative reconstruction technique. COMPARISON:  Chest x-ray 09/03/2022 FINDINGS: CT HEAD FINDINGS BRAIN: BRAIN Cerebral ventricle sizes are concordant with the degree of cerebral volume loss. Patchy and confluent areas of decreased attenuation are noted throughout the deep and periventricular white matter of the cerebral hemispheres bilaterally, compatible with chronic microvascular ischemic disease. No evidence of large-territorial acute infarction. No parenchymal hemorrhage. No mass lesion. No extra-axial collection. No mass effect or midline shift. No hydrocephalus. Basilar cisterns are patent. Vascular:  No hyperdense vessel. Skull: No acute fracture or focal lesion. Sinuses/Orbits: Paranasal sinuses and mastoid air cells are clear. Bilateral lens replacement. Otherwise the orbits are unremarkable. Other: None. CT CERVICAL SPINE FINDINGS Alignment: Grade 1 anterolisthesis of C3 on C4. Skull base and vertebrae: Multilevel moderate severe degenerative changes spine. No associated severe osseous neural foraminal stenosis. No acute fracture. No aggressive appearing focal osseous lesion or focal pathologic process. Soft tissues and spinal canal: No prevertebral fluid or swelling. No visible canal hematoma. Upper chest: Diffuse bronchial wall thickening. Periapical pleural/pulmonary scarring. Other: Atherosclerotic plaque of the aortic arch and its branches. IMPRESSION: 1. No acute intracranial abnormality. 2. No acute displaced fracture or traumatic listhesis of the cervical spine. 3.  Aortic Atherosclerosis (ICD10-I70.0). 4. Small airway disease. Electronically Signed   By: Iven Finn M.D.   On: 09/03/2022 22:34   CT Cervical Spine Wo Contrast  Result Date: 09/03/2022 CLINICAL DATA:  Head trauma, moderate-severe; Neck trauma (Age >= 65y). No contrast. Pt BIB EMS from home, lives alone. Was sitting at dining room table when she had syncopal episode. Pt states this happens when in a-fib. Pt crawled to phone to call EMS. C/o lightheadedness, denies pain/injury. EXAM: CT HEAD WITHOUT CONTRAST CT CERVICAL SPINE WITHOUT CONTRAST TECHNIQUE: Multidetector CT imaging of the head and cervical spine was performed following the standard protocol without intravenous contrast. Multiplanar CT image reconstructions of the cervical spine were also generated. RADIATION DOSE REDUCTION: This exam was performed according to the departmental dose-optimization program which includes automated exposure control, adjustment of the mA and/or kV according to patient size and/or use of iterative reconstruction technique. COMPARISON:  Chest  x-ray 09/03/2022 FINDINGS: CT HEAD FINDINGS BRAIN: BRAIN Cerebral ventricle sizes are concordant with the degree of  cerebral volume loss. Patchy and confluent areas of decreased attenuation are noted throughout the deep and periventricular white matter of the cerebral hemispheres bilaterally, compatible with chronic microvascular ischemic disease. No evidence of large-territorial acute infarction. No parenchymal hemorrhage. No mass lesion. No extra-axial collection. No mass effect or midline shift. No hydrocephalus. Basilar cisterns are patent. Vascular: No hyperdense vessel. Skull: No acute fracture or focal lesion. Sinuses/Orbits: Paranasal sinuses and mastoid air cells are clear. Bilateral lens replacement. Otherwise the orbits are unremarkable. Other: None. CT CERVICAL SPINE FINDINGS Alignment: Grade 1 anterolisthesis of C3 on C4. Skull base and vertebrae: Multilevel moderate severe degenerative changes spine. No associated severe osseous neural foraminal stenosis. No acute fracture. No aggressive appearing focal osseous lesion or focal pathologic process. Soft tissues and spinal canal: No prevertebral fluid or swelling. No visible canal hematoma. Upper chest: Diffuse bronchial wall thickening. Periapical pleural/pulmonary scarring. Other: Atherosclerotic plaque of the aortic arch and its branches. IMPRESSION: 1. No acute intracranial abnormality. 2. No acute displaced fracture or traumatic listhesis of the cervical spine. 3.  Aortic Atherosclerosis (ICD10-I70.0). 4. Small airway disease. Electronically Signed   By: Iven Finn M.D.   On: 09/03/2022 22:34   DG Pelvis Portable  Result Date: 09/03/2022 CLINICAL DATA:  Syncope. EXAM: PORTABLE PELVIS 1-2 VIEWS COMPARISON:  None Available. FINDINGS: There is no evidence of an acute pelvic fracture or diastasis. No pelvic bone lesions are seen. Moderate severity degenerative changes are seen involving the bilateral hips in the form of joint space narrowing and  acetabular sclerosis. Postoperative and degenerative changes seen within the visualized portion of the lumbar spine. IMPRESSION: 1. 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 2. Postoperative and degenerative changes within the lumbar spine. Electronically Signed   By: Virgina Norfolk M.D.   On: 09/03/2022 22:19   DG Chest Portable 1 View  Result Date: 09/03/2022 CLINICAL DATA:  Status post fall. EXAM: PORTABLE CHEST 1 VIEW COMPARISON:  May 20, 2021 FINDINGS: The cardiac silhouette is moderately enlarged and unchanged in size. There is moderate severity calcification of the aortic arch. Diffuse, chronic appearing increased interstitial lung markings are seen. There is no evidence of acute infiltrate, pleural effusion or pneumothorax. Multilevel degenerative changes are seen throughout the thoracic spine. IMPRESSION: Stable cardiomegaly and chronic appearing increased interstitial lung markings without evidence of acute or active cardiopulmonary disease. Electronically Signed   By: Virgina Norfolk M.D.   On: 09/03/2022 22:17   _______________________________________________________________________________________________________ Latest  Blood pressure (!) 158/81, pulse 76, temperature 98.5 F (36.9 C), temperature source Oral, resp. rate 16, height 5' (1.524 m), weight 56.2 kg, SpO2 99 %.   Vitals  labs and radiology finding personally reviewed  Review of Systems:    Pertinent positives include:   fatigue, fall  Constitutional:  No weight loss, night sweats, Fevers, chills,weight loss  HEENT:  No headaches, Difficulty swallowing,Tooth/dental problems,Sore throat,  No sneezing, itching, ear ache, nasal congestion, post nasal drip,  Cardio-vascular:  No chest pain, Orthopnea, PND, anasarca, dizziness, palpitations.no Bilateral lower extremity swelling  GI:  No heartburn, indigestion, abdominal pain, nausea, vomiting, diarrhea, change in bowel habits, loss of appetite,  melena, blood in stool, hematemesis Resp:  no shortness of breath at rest. No dyspnea on exertion, No excess mucus, no productive cough, No non-productive cough, No coughing up of blood.No change in color of mucus.No wheezing. Skin:  no rash or lesions. No jaundice GU:  no dysuria, change in color of urine, no urgency or frequency. No straining to  urinate.  No flank pain.  Musculoskeletal:  No joint pain or no joint swelling. No decreased range of motion. No back pain.  Psych:  No change in mood or affect. No depression or anxiety. No memory loss.  Neuro: no localizing neurological complaints, no tingling, no weakness, no double vision, no gait abnormality, no slurred speech, no confusion  All systems reviewed and apart from [place] all are negative _______________________________________________________________________________________________ Past Medical History:   Past Medical History:  Diagnosis Date  . Atrial fibrillation ([place])   . Chronic renal disease, stage II   . GERD (gastroesophageal reflux disease)   . Hypercholesteremia   . MR (mitral regurgitation)    , Mild  . Osteoarthritis   . Osteoporosis       Past Surgical History:  Procedure Laterality Date  . BACK SURGERY    . JOINT REPLACEMENT      Social History:  Ambulatory *** independently cane, walker  wheelchair bound, bed bound     reports that she has never smoked. She has never used smokeless tobacco. She reports that she does not drink alcohol and does not use drugs.     Family History:   Family History  Problem Relation Age of Onset  . Arthritis Mother   . Hypothyroidism Mother   . Heart attack Father    ______________________________________________________________________________________________ Allergies: Allergies  Allergen Reactions  . Darvocet [Propoxyphene N-Acetaminophen] Nausea And Vomiting  . Lodine [Etodolac]     Unknown to patient  . Meprozine [Meperidine-Promethazine]      Reaction=tongue swelling  . Percocet [Oxycodone-Acetaminophen]   . Ultracet [Tramadol-Acetaminophen] Nausea And Vomiting  . Demerol Nausea And Vomiting  . Hydrocodone Rash  . Penicillins Rash     Prior to Admission medications   Medication Sig Start Date End Date Taking? Authorizing Provider  apixaban (ELIQUIS) 2.5 MG TABS tablet Take 1 tablet (2.5 mg total) by mouth 2 (two) times daily. 06/21/22   Jerline Pain, MD  brimonidine (ALPHAGAN) 0.2 % ophthalmic solution Place 1 drop into the right eye 2 (two) times daily. 08/01/21   [provider]  Calcium Carbonate-Vitamin D3 600-400 MG-UNIT TABS Take 1 tablet by mouth daily.    [provider]  celecoxib (CELEBREX) 200 MG capsule Take 200 mg by mouth daily.      [provider]  cholecalciferol (VITAMIN D) 1000 UNITS tablet Take 1,000 Units by mouth daily.      [provider]  dorzolamide-timolol (COSOPT) 22.3-6.8 MG/ML ophthalmic solution Place 1 drop into the right eye 2 (two) times daily. 08/16/20   [provider]  latanoprost (XALATAN) 0.005 % ophthalmic solution Place 1 drop into both eyes daily. 08/12/19   [provider]  metoprolol succinate (TOPROL-XL) 25 MG 24 hr tablet TAKE ONE AND ONE-HALF TABLETS DAILY 06/12/22   Jerline Pain, MD  vitamin C (ASCORBIC ACID) 500 MG tablet Take 500 mg by mouth daily.      [provider]    ___________________________________________________________________________________________________ Physical Exam:    09/03/2022   10:00 PM 09/03/2022    9:53 PM 09/03/2022    9:52 PM  Vitals with BMI  Systolic 0000000  [id number]  Diastolic 81  [id number]  Pulse 76 78 87     1. General:  in No  Acute distress   Chronically ill   -appearing 2. Psychological: Alert and   Oriented 3. Head/ENT:    Dry Mucous Membranes  Head Non traumatic, neck supple                          Normal *** Poor Dentition 4. SKIN: normal *** decreased Skin  turgor,  Skin clean Dry and intact no rash 5. Heart: Regular rate and rhythm no*** Murmur, no Rub or gallop 6. Lungs: ***Clear to auscultation bilaterally, no wheezes or crackles   7. Abdomen: Soft, ***non-tender, Non distended *** obese ***bowel sounds present 8. Lower extremities: no clubbing, cyanosis, no ***edema 9. Neurologically Grossly intact, moving all 4 extremities equally   10. MSK: Normal range of motion    Chart has been reviewed  ______________________________________________________________________________________________  Assessment/Plan 88 y.o. female with medical history significant of A.fib on eliquis, hypertension, CKD stage II, GERD, HLD, mitral regurgitation, osteoporosis    Admitted for syncope   Present on Admission: . Syncope     No problem-specific Assessment & Plan notes found for this encounter.    Other plan as per orders.  DVT prophylaxis:  SCD *** Lovenox       Code Status:    Code Status: Not on file FULL CODE *** DNR/DNI ***comfort care as per patient ***family  I had personally discussed CODE STATUS with patient and family* I had spent *min discussing goals of care and CODE STATUS    Family Communication:   Family not at  Bedside  plan of care was discussed on the phone with *** Son, Daughter, Wife, Husband, Sister, Brother , father, mother  Disposition Plan:   *** likely will need placement for rehabilitation                          Back to current facility when stable                            To home once workup is complete and patient is stable  ***Following barriers for discharge:                            Electrolytes corrected                               Anemia corrected                             Pain controlled with PO medications                               Afebrile, white count improving able to transition to PO antibiotics                             Will need to be able to tolerate PO                             Will likely need home health, home O2, set up                           Will need consultants to evaluate patient prior to discharge  ****EXPECT DC tomorrow                    ***  Would benefit from PT/OT eval prior to DC  Ordered                   Swallow eval - SLP ordered                   Diabetes care coordinator                   Transition of care consulted                   Nutrition    consulted                  Wound care  consulted                   Palliative care    consulted                   Behavioral health  consulted                    Consults called: Cardiology is aware  Admission status:  ED Disposition     ED Disposition  [place]: [place] [[id number]]  Level of Care: Telemetry Cardiac [[id number]]  May place patient in observation at [facility] or [place] if equivalent level of care is available:: No  Covid Evaluation: Asymptomatic - no recent exposure (last 10 days) testing not required  Diagnosis: Syncope [[id number]]  Admitting Physician: Toy Baker [[id number]]  Attending Physician: Toy Baker [[id number]]           Obs      Level of care     tele  For   24H      Hanni Milford 09/03/2022, 11:21 PM ***  [facility]     after 2 AM please page floor coverage PA If 7AM-7PM, please contact the day team taking care of the patient using [email or web address]   Patient was evaluated in the context of the global COVID-19 pandemic, which necessitated consideration that the patient might be at risk for infection with the SARS-CoV-2 virus that causes COVID-19. Institutional protocols and algorithms that pertain to the evaluation of patients at risk for COVID-19 are in a state of rapid change based on information released by regulatory bodies including the CDC and federal and state organizations. These policies and algorithms were followed during the patient's care.

## 2022-09-04 ENCOUNTER — Encounter (HOSPITAL_COMMUNITY): Payer: Self-pay | Admitting: Internal Medicine

## 2022-09-04 ENCOUNTER — Observation Stay (HOSPITAL_BASED_OUTPATIENT_CLINIC_OR_DEPARTMENT_OTHER): Payer: Medicare Other

## 2022-09-04 DIAGNOSIS — R55 Syncope and collapse: Secondary | ICD-10-CM | POA: Diagnosis not present

## 2022-09-04 DIAGNOSIS — R5381 Other malaise: Secondary | ICD-10-CM | POA: Diagnosis not present

## 2022-09-04 DIAGNOSIS — R252 Cramp and spasm: Secondary | ICD-10-CM | POA: Diagnosis present

## 2022-09-04 DIAGNOSIS — D696 Thrombocytopenia, unspecified: Secondary | ICD-10-CM

## 2022-09-04 DIAGNOSIS — I351 Nonrheumatic aortic (valve) insufficiency: Secondary | ICD-10-CM | POA: Diagnosis not present

## 2022-09-04 DIAGNOSIS — I10 Essential (primary) hypertension: Secondary | ICD-10-CM | POA: Diagnosis not present

## 2022-09-04 DIAGNOSIS — I4821 Permanent atrial fibrillation: Secondary | ICD-10-CM | POA: Diagnosis not present

## 2022-09-04 LAB — ECHOCARDIOGRAM COMPLETE
Area-P 1/2: 4.21 cm2
Calc EF: 67.3 %
Height: 60 in
P 1/2 time: 723 msec
S' Lateral: 3.37 cm
Single Plane A2C EF: 70 %
Single Plane A4C EF: 65.8 %
Weight: 1982.38 oz

## 2022-09-04 LAB — CBC WITH DIFFERENTIAL/PLATELET
Abs Immature Granulocytes: 0.02 10*3/uL (ref 0.00–0.07)
Basophils Absolute: 0 10*3/uL (ref 0.0–0.1)
Basophils Relative: 0 %
Eosinophils Absolute: 0.1 10*3/uL (ref 0.0–0.5)
Eosinophils Relative: 2 %
HCT: 38.3 % (ref 36.0–46.0)
Hemoglobin: 12.4 g/dL (ref 12.0–15.0)
Immature Granulocytes: 0 %
Lymphocytes Relative: 21 %
Lymphs Abs: 1.2 10*3/uL (ref 0.7–4.0)
MCH: 31.2 pg (ref 26.0–34.0)
MCHC: 32.4 g/dL (ref 30.0–36.0)
MCV: 96.2 fL (ref 80.0–100.0)
Monocytes Absolute: 0.6 10*3/uL (ref 0.1–1.0)
Monocytes Relative: 10 %
Neutro Abs: 3.9 10*3/uL (ref 1.7–7.7)
Neutrophils Relative %: 67 %
Platelets: 137 10*3/uL — ABNORMAL LOW (ref 150–400)
RBC: 3.98 MIL/uL (ref 3.87–5.11)
RDW: 12 % (ref 11.5–15.5)
WBC: 5.9 10*3/uL (ref 4.0–10.5)
nRBC: 0 % (ref 0.0–0.2)

## 2022-09-04 LAB — TROPONIN I (HIGH SENSITIVITY)
Troponin I (High Sensitivity): 15 ng/L (ref <18)
Troponin I (High Sensitivity): 21 ng/L — ABNORMAL HIGH (ref <18)

## 2022-09-04 LAB — URINALYSIS, COMPLETE (UACMP) WITH MICROSCOPIC
Bacteria, UA: NONE SEEN
Bilirubin Urine: NEGATIVE
Glucose, UA: NEGATIVE mg/dL
Hgb urine dipstick: NEGATIVE
Ketones, ur: NEGATIVE mg/dL
Leukocytes,Ua: NEGATIVE
Nitrite: NEGATIVE
Protein, ur: NEGATIVE mg/dL
Specific Gravity, Urine: 1.01 (ref 1.005–1.030)
pH: 6 (ref 5.0–8.0)

## 2022-09-04 LAB — MAGNESIUM
Magnesium: 1.8 mg/dL (ref 1.7–2.4)
Magnesium: 1.5 mg/dL — ABNORMAL LOW (ref 1.7–2.4)

## 2022-09-04 LAB — COMPREHENSIVE METABOLIC PANEL
ALT: 15 U/L (ref 0–44)
AST: 28 U/L (ref 15–41)
Albumin: 3.4 g/dL — ABNORMAL LOW (ref 3.5–5.0)
Alkaline Phosphatase: 62 U/L (ref 38–126)
Anion gap: 10 (ref 5–15)
BUN: 25 mg/dL — ABNORMAL HIGH (ref 8–23)
CO2: 25 mmol/L (ref 22–32)
Calcium: 9.3 mg/dL (ref 8.9–10.3)
Chloride: 107 mmol/L (ref 98–111)
Creatinine, Ser: 0.95 mg/dL (ref 0.44–1.00)
GFR, Estimated: 55 mL/min — ABNORMAL LOW (ref >60)
Glucose, Bld: 87 mg/dL (ref 70–99)
Potassium: 3.7 mmol/L (ref 3.5–5.1)
Sodium: 142 mmol/L (ref 135–145)
Total Bilirubin: 1.1 mg/dL (ref 0.3–1.2)
Total Protein: 5.7 g/dL — ABNORMAL LOW (ref 6.5–8.1)

## 2022-09-04 LAB — PREALBUMIN: Prealbumin: 14 mg/dL — ABNORMAL LOW (ref 18–38)

## 2022-09-04 LAB — CK: Total CK: 207 U/L (ref 38–234)

## 2022-09-04 LAB — PHOSPHORUS
Phosphorus: 2.8 mg/dL (ref 2.5–4.6)
Phosphorus: 2.8 mg/dL (ref 2.5–4.6)

## 2022-09-04 LAB — TSH: TSH: 5.028 u[IU]/mL — ABNORMAL HIGH (ref 0.350–4.500)

## 2022-09-04 MED ORDER — DORZOLAMIDE HCL-TIMOLOL MAL 2-0.5 % OP SOLN
1.0000 [drp] | Freq: Two times a day (BID) | OPHTHALMIC | Status: DC
  Administered 2022-09-05 – 2022-09-06 (×2): 1 [drp] via OPHTHALMIC
  Filled 2022-09-04: qty 10

## 2022-09-04 MED ORDER — BRIMONIDINE TARTRATE 0.2 % OP SOLN
1.0000 [drp] | Freq: Two times a day (BID) | OPHTHALMIC | Status: DC
  Administered 2022-09-06: 1 [drp] via OPHTHALMIC
  Filled 2022-09-04: qty 5

## 2022-09-04 MED ORDER — APIXABAN 2.5 MG PO TABS
2.5000 mg | ORAL_TABLET | Freq: Two times a day (BID) | ORAL | Status: DC
  Administered 2022-09-04 – 2022-09-06 (×5): 2.5 mg via ORAL
  Filled 2022-09-04 (×5): qty 1

## 2022-09-04 MED ORDER — SODIUM CHLORIDE 0.9 % IV SOLN: INTRAVENOUS | Status: AC

## 2022-09-04 MED ORDER — ACETAMINOPHEN 325 MG PO TABS
650.0000 mg | ORAL_TABLET | Freq: Four times a day (QID) | ORAL | Status: DC | PRN
  Administered 2022-09-06: 650 mg via ORAL
  Filled 2022-09-04: qty 2

## 2022-09-04 MED ORDER — METHOCARBAMOL 1000 MG/10ML IJ SOLN: 500.0000 mg | Freq: Four times a day (QID) | INTRAVENOUS | Status: DC | PRN

## 2022-09-04 MED ORDER — METOPROLOL SUCCINATE ER 25 MG PO TB24
37.5000 mg | ORAL_TABLET | Freq: Every day | ORAL | Status: DC
  Administered 2022-09-04 – 2022-09-06 (×3): 37.5 mg via ORAL
  Filled 2022-09-04 (×3): qty 2

## 2022-09-04 MED ORDER — MAGNESIUM OXIDE -MG SUPPLEMENT 400 (240 MG) MG PO TABS
400.0000 mg | ORAL_TABLET | Freq: Every day | ORAL | Status: DC
  Administered 2022-09-04 – 2022-09-06 (×3): 400 mg via ORAL
  Filled 2022-09-04 (×3): qty 1

## 2022-09-04 MED ORDER — MAGNESIUM SULFATE 2 GM/50ML IV SOLN
2.0000 g | Freq: Once | INTRAVENOUS | Status: AC
  Administered 2022-09-04: 2 g via INTRAVENOUS
  Filled 2022-09-04: qty 50

## 2022-09-04 NOTE — Consult Note (Addendum)
Cardiology Consultation   Patient ID: Jessica Berg MRN: [id number]; DOB: January 03, 1924  Admit date: 09/03/2022 Date of Consult: 09/04/2022  PCP:  Lajean Manes, MD   [facility] Cardiologist:  Candee Furbish, MD        Patient Profile:   Jessica Berg is a 88 y.o. female with a hx of syncope, atrial fib, HTN, who is being seen 09/04/2022 for the evaluation of near syncope  at the request of [facility].  History of Present Illness:   [name] has hx as above and hx of syncope and was seen by Dr. Curt Bears.  At times she feels a rush to her head that she feels she will pass out  She wore a Holter monitor and an event monitor which were both unremarkable in the past.   Last monitor 09/2021 with continuous atrial fib 100% burden.  Average HR 67, max HR 113, BBB, rare PVC no significant pause seen.  Average lowest HR 56 She has permanent atrial fib   Pt presented to ER by EMS after sitting in Dining room, on bar stool,  and passed out.  She did have dizziness prior to being on the ground.  She stated she has episodes when her a fib acts up but she is in a fib permanently.  She could not get up off the floor and was on floor for 30 min. Marland Kitchen  No chest pain or SOB - her story to me is somewhat different in that she could not find her roll for lunch and was looking and fell over other stool and onto floor.  She did not pass out but she tells me she was not normal either.  She could not think appropriately. Could not move well on floor and she is usually active.     CXR - Stable cardiomegaly and chronic appearing increased interstitial lung markings without evidence of acute or active cardiopulmonary disease.  CT head/Neck no acute process.   Na 142, K+ 3.7 BUN 25 Cr 0.95 Mg+ 1.5 albumin 3.7 to 3.4  Hs troponin 17,15,21 CK 207 WBC 5.9 Hgb 12.4 plts 137    Pelvis films IMPRESSION: 1. 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 2. Postoperative and  degenerative changes within the lumbar spine.  Echo EF 55-60% no RWMA of LV, RV normal, The estimated right ventricular systolic pressure is 99991111 mmHg rt and Lt atrium moderately dilated. Trivial MR, mild AI   EKG:  The EKG was personally reviewed and demonstrates:  atrial fib 70, hx ant Q waves, no acute ST changes Telemetry:  Telemetry was personally reviewed and demonstrates:  atrial fib.   Other issues that concern pt and her daughter is trouble walking -no fx on pelvic pictures   BP 112/57  (on arrival to ER 158/81)  P- 870-80s afebrile  Past Medical History:  Diagnosis Date   Atrial fibrillation (HCC)    Chronic renal disease, stage II    GERD (gastroesophageal reflux disease)    Hypercholesteremia    MR (mitral regurgitation)    , Mild   Osteoarthritis    Osteoporosis     Past Surgical History:  Procedure Laterality Date   BACK SURGERY     JOINT REPLACEMENT       Home Medications:  Prior to Admission medications   Medication Sig Start Date End Date Taking? Authorizing Provider  apixaban (ELIQUIS) 2.5 MG TABS tablet Take 1 tablet (2.5 mg total) by mouth 2 (two) times daily. 06/21/22  Yes Jerline Pain, MD  Calcium Carbonate-Vitamin D3 600-400 MG-UNIT TABS Take 1 tablet by mouth daily.   Yes [provider]  carboxymethylcellulose (REFRESH PLUS) 0.5 % SOLN Place 1 drop into both eyes See admin instructions. Instill 1 drop into both eyes every hour   Yes [provider]  celecoxib (CELEBREX) 200 MG capsule Take 200 mg by mouth daily.     Yes [provider]  cholecalciferol (VITAMIN D) 1000 UNITS tablet Take 1,000 Units by mouth daily.     Yes [provider]  dorzolamide-timolol (COSOPT) 22.3-6.8 MG/ML ophthalmic solution Place 1 drop into the right eye 2 (two) times daily. 08/16/20  Yes [provider]  metoprolol succinate (TOPROL-XL) 25 MG 24 hr tablet TAKE ONE AND ONE-HALF TABLETS DAILY Patient taking differently: Take 37.5 mg  by mouth daily. 06/12/22  Yes Jerline Pain, MD  vitamin C (ASCORBIC ACID) 500 MG tablet Take 500 mg by mouth daily.     Yes [provider]  brimonidine (ALPHAGAN) 0.2 % ophthalmic solution Place 1 drop into the right eye 2 (two) times daily. Patient not taking: Reported on 09/04/2022 08/01/21   [provider]    Inpatient Medications: Scheduled Meds:  apixaban  2.5 mg Oral BID   brimonidine  1 drop Right Eye BID   dorzolamide-timolol  1 drop Right Eye BID   magnesium oxide  400 mg Oral Daily   metoprolol succinate  37.5 mg Oral Daily   Continuous Infusions:  sodium chloride 75 mL/hr at 09/04/22 1109   methocarbamol (ROBAXIN) IV     PRN Meds: acetaminophen, methocarbamol (ROBAXIN) IV  Allergies:    Allergies  Allergen Reactions   Darvocet [Propoxyphene N-Acetaminophen] Nausea And Vomiting   Lodine [Etodolac] Hives    Unknown to patient   Meprozine [Meperidine-Promethazine]     Reaction=tongue swelling   Percocet [Oxycodone-Acetaminophen] Nausea And Vomiting    Dizziness    Ultracet [Tramadol-Acetaminophen] Other (See Comments)    headache   Demerol Hives   Hydrocodone Itching   Penicillins Rash    Social History:   Social History   Socioeconomic History   Marital status: Widowed    Spouse name: Not on file   Number of children: Not on file   Years of education: Not on file   Highest education level: Not on file  Occupational History   Not on file  Tobacco Use   Smoking status: Never   Smokeless tobacco: Never  Vaping Use   Vaping Use: Never used  Substance and Sexual Activity   Alcohol use: No   Drug use: No   Sexual activity: Not on file  Other Topics Concern   Not on file  Social History Narrative   Not on file   Social Determinants of Health   Financial Resource Strain: Not on file  Food Insecurity: Not on file  Transportation Needs: Not on file  Physical Activity: Not on file  Stress: Not on file  Social Connections: Not on  file  Intimate Partner Violence: Not on file    Family History:    Family History  Problem Relation Age of Onset   Arthritis Mother    Hypothyroidism Mother    Heart attack Father      ROS:  Please see the history of present illness.  General:no colds or fevers, no weight changes Skin:no rashes or ulcers HEENT:no blurred vision, no congestion CV:see HPI PUL:see HPI GI:no diarrhea constipation or melena, no indigestion  hx of  GERD GU:no hematuria, no dysuria MS:no joint pain, no claudication, difficulty walking Neuro:no syncope, no lightheadedness Endo:no diabetes, no thyroid disease  All other ROS reviewed and negative.     Physical Exam/Data:   Vitals:   09/04/22 0600 09/04/22 0900 09/04/22 1103 09/04/22 1115  BP: (!) 142/85 (!) 154/76 (!) 112/57   Pulse: 83 91 86   Resp: 16 (!) 22 18   Temp:    98.4 F (36.9 C)  TempSrc:    Oral  SpO2: 100% 99% 98%   Weight:      Height:       No intake or output data in the 24 hours ending 09/04/22 1226    09/03/2022    9:51 PM 03/14/2022    9:02 AM 09/18/2021    9:03 AM  Last 3 Weights  Weight (lbs) 123 lb 14.4 oz 124 lb 126 lb 9.6 oz  Weight (kg) 56.2 kg 56.246 kg 57.425 kg     Body mass index is 24.2 kg/m.  General:  somewhat frail female, in no acute distress HEENT: normal Neck: no JVD Vascular: No carotid bruits; Distal pulses 2+ bilaterally Cardiac:  normal S1, S2; RRR; 1/6 systolic murmur no gallup or rub Lungs:  clear to auscultation bilaterally, no wheezing, rhonchi or rales  Abd: soft, nontender, no hepatomegaly  Ext: no edema (her daughter notes her edema has resolved from yesterday) Musculoskeletal:  No deformities, BUE and BLE strength normal and equal Skin: warm and dry  Neuro:  alert and oriented X 3 MAE follows commands, no focal abnormalities noted Psych:  Normal affect    Relevant CV Studies: 14 day monitor  Continuous atrial fibrillation throughout recording, 100% burden. Average heart rate was  67 bpm. Maximum heart rate 113. Bundle branch block was present. Rare PVCs. Average lowest heart rate 56 bpm. No significant pauses noted.   Overall reassuring monitor with no significant pauses or significant prolonged bradycardic episodes.  Atrial fibrillation noted throughout tracings. Candee Furbish, MD   Echo 09/04/22  IMPRESSIONS     1. Left ventricular ejection fraction, by estimation, is 55 to 60%. The  left ventricle has normal function. The left ventricle has no regional  wall motion abnormalities. Left ventricular diastolic function could not  be evaluated.   2. Right ventricular systolic function is normal. The right ventricular  size is normal. There is mildly elevated pulmonary artery systolic  pressure. The estimated right ventricular systolic pressure is 99991111 mmHg.   3. Left atrial size was moderately dilated.   4. Right atrial size was moderately dilated.   5. The mitral valve is normal in structure. Trivial mitral valve  regurgitation. No evidence of mitral stenosis. Moderate mitral annular  calcification.   6. The aortic valve is tricuspid. There is mild calcification of the  aortic valve. There is mild thickening of the aortic valve. Aortic valve  regurgitation is mild. Aortic valve sclerosis is present, with no evidence  of aortic valve stenosis.   7. The inferior vena cava is normal in size with greater than 50%  respiratory variability, suggesting right atrial pressure of 3 mmHg.   Comparison(s): No prior Echocardiogram.   Conclusion(s)/Recommendation(s): Otherwise normal echocardiogram, with  minor abnormalities described in the report.   FINDINGS   Left Ventricle: Left ventricular ejection fraction, by estimation, is 55  to 60%. The left ventricle has normal function. The left ventricle has no  regional wall motion abnormalities. The left ventricular internal cavity  size was normal in size. There  is   no left ventricular hypertrophy. Left ventricular  diastolic function  could not be evaluated due to atrial fibrillation. Left ventricular  diastolic function could not be evaluated.   Right Ventricle: The right ventricular size is normal. Right vetricular  wall thickness was not well visualized. Right ventricular systolic  function is normal. There is mildly elevated pulmonary artery systolic  pressure. The tricuspid regurgitant velocity   is 3.12 m/s, and with an assumed right atrial pressure of 3 mmHg, the  estimated right ventricular systolic pressure is 99991111 mmHg.   Left Atrium: Left atrial size was moderately dilated.   Right Atrium: Right atrial size was moderately dilated.   Pericardium: There is no evidence of pericardial effusion.   Mitral Valve: The mitral valve is normal in structure. There is mild  thickening of the mitral valve leaflet(s). There is mild calcification of  the mitral valve leaflet(s). Moderate mitral annular calcification.  Trivial mitral valve regurgitation. No  evidence of mitral valve stenosis.   Tricuspid Valve: The tricuspid valve is normal in structure. Tricuspid  valve regurgitation is trivial. No evidence of tricuspid stenosis.   Aortic Valve: The aortic valve is tricuspid. There is mild calcification  of the aortic valve. There is mild thickening of the aortic valve. Aortic  valve regurgitation is mild. Aortic regurgitation PHT measures 723 msec.  Aortic valve sclerosis is present,   with no evidence of aortic valve stenosis.   Pulmonic Valve: The pulmonic valve was not well visualized. Pulmonic valve  regurgitation is mild. No evidence of pulmonic stenosis.   Aorta: The aortic root, ascending aorta and aortic arch are all  structurally normal, with no evidence of dilitation or obstruction.   Venous: The inferior vena cava is normal in size with greater than 50%  respiratory variability, suggesting right atrial pressure of 3 mmHg.   IAS/Shunts: The atrial septum is grossly normal.    Laboratory Data:  High Sensitivity Troponin:   Recent Labs  Lab 09/03/22 2235 09/04/22 0057 09/04/22 0306  TROPONINIHS 17 15 21*     Chemistry Recent Labs  Lab 09/03/22 2235 09/04/22 0057 09/04/22 1018  NA 142  --  142  K 4.0  --  3.7  CL 109  --  107  CO2 26  --  25  GLUCOSE 106*  --  87  BUN 35*  --  25*  CREATININE 1.18*  --  0.95  CALCIUM 9.4  --  9.3  MG  --  1.8 1.5*  GFRNONAA 42*  --  55*  ANIONGAP 7  --  10    Recent Labs  Lab 09/03/22 2235 09/04/22 1018  PROT 6.0* 5.7*  ALBUMIN 3.7 3.4*  AST 30 28  ALT 15 15  ALKPHOS 79 62  BILITOT 0.7 1.1   Lipids No results for input(s): "CHOL", "TRIG", "HDL", "LABVLDL", "LDLCALC", "CHOLHDL" in the last 168 hours.  Hematology Recent Labs  Lab 09/03/22 2155 09/04/22 1018  WBC 7.0 5.9  RBC 4.11 3.98  HGB 13.1 12.4  HCT 40.6 38.3  MCV 98.8 96.2  MCH 31.9 31.2  MCHC 32.3 32.4  RDW 12.1 12.0  PLT 147* 137*   Thyroid  Recent Labs  Lab 09/04/22 0306  TSH 5.028*    BNPNo results for input(s): "BNP", "PROBNP" in the last 168 hours.  DDimer No results for input(s): "DDIMER" in the last 168 hours.   Radiology/Studies:  CT Head Wo Contrast  Result Date: 09/03/2022 CLINICAL DATA:  Head trauma, moderate-severe; Neck  trauma (Age >= 65y). No contrast. Pt BIB EMS from home, lives alone. Was sitting at dining room table when she had syncopal episode. Pt states this happens when in a-fib. Pt crawled to phone to call EMS. C/o lightheadedness, denies pain/injury. EXAM: CT HEAD WITHOUT CONTRAST CT CERVICAL SPINE WITHOUT CONTRAST TECHNIQUE: Multidetector CT imaging of the head and cervical spine was performed following the standard protocol without intravenous contrast. Multiplanar CT image reconstructions of the cervical spine were also generated. RADIATION DOSE REDUCTION: This exam was performed according to the departmental dose-optimization program which includes automated exposure control, adjustment of the mA and/or  kV according to patient size and/or use of iterative reconstruction technique. COMPARISON:  Chest x-ray 09/03/2022 FINDINGS: CT HEAD FINDINGS BRAIN: BRAIN Cerebral ventricle sizes are concordant with the degree of cerebral volume loss. Patchy and confluent areas of decreased attenuation are noted throughout the deep and periventricular white matter of the cerebral hemispheres bilaterally, compatible with chronic microvascular ischemic disease. No evidence of large-territorial acute infarction. No parenchymal hemorrhage. No mass lesion. No extra-axial collection. No mass effect or midline shift. No hydrocephalus. Basilar cisterns are patent. Vascular: No hyperdense vessel. Skull: No acute fracture or focal lesion. Sinuses/Orbits: Paranasal sinuses and mastoid air cells are clear. Bilateral lens replacement. Otherwise the orbits are unremarkable. Other: None. CT CERVICAL SPINE FINDINGS Alignment: Grade 1 anterolisthesis of C3 on C4. Skull base and vertebrae: Multilevel moderate severe degenerative changes spine. No associated severe osseous neural foraminal stenosis. No acute fracture. No aggressive appearing focal osseous lesion or focal pathologic process. Soft tissues and spinal canal: No prevertebral fluid or swelling. No visible canal hematoma. Upper chest: Diffuse bronchial wall thickening. Periapical pleural/pulmonary scarring. Other: Atherosclerotic plaque of the aortic arch and its branches. IMPRESSION: 1. No acute intracranial abnormality. 2. No acute displaced fracture or traumatic listhesis of the cervical spine. 3.  Aortic Atherosclerosis (ICD10-I70.0). 4. Small airway disease. Electronically Signed   By: Iven Finn M.D.   On: 09/03/2022 22:34   CT Cervical Spine Wo Contrast  Result Date: 09/03/2022 CLINICAL DATA:  Head trauma, moderate-severe; Neck trauma (Age >= 65y). No contrast. Pt BIB EMS from home, lives alone. Was sitting at dining room table when she had syncopal episode. Pt states this  happens when in a-fib. Pt crawled to phone to call EMS. C/o lightheadedness, denies pain/injury. EXAM: CT HEAD WITHOUT CONTRAST CT CERVICAL SPINE WITHOUT CONTRAST TECHNIQUE: Multidetector CT imaging of the head and cervical spine was performed following the standard protocol without intravenous contrast. Multiplanar CT image reconstructions of the cervical spine were also generated. RADIATION DOSE REDUCTION: This exam was performed according to the departmental dose-optimization program which includes automated exposure control, adjustment of the mA and/or kV according to patient size and/or use of iterative reconstruction technique. COMPARISON:  Chest x-ray 09/03/2022 FINDINGS: CT HEAD FINDINGS BRAIN: BRAIN Cerebral ventricle sizes are concordant with the degree of cerebral volume loss. Patchy and confluent areas of decreased attenuation are noted throughout the deep and periventricular white matter of the cerebral hemispheres bilaterally, compatible with chronic microvascular ischemic disease. No evidence of large-territorial acute infarction. No parenchymal hemorrhage. No mass lesion. No extra-axial collection. No mass effect or midline shift. No hydrocephalus. Basilar cisterns are patent. Vascular: No hyperdense vessel. Skull: No acute fracture or focal lesion. Sinuses/Orbits: Paranasal sinuses and mastoid air cells are clear. Bilateral lens replacement. Otherwise the orbits are unremarkable. Other: None. CT CERVICAL SPINE FINDINGS Alignment: Grade 1 anterolisthesis of C3 on C4. Skull base and vertebrae: Multilevel moderate  severe degenerative changes spine. No associated severe osseous neural foraminal stenosis. No acute fracture. No aggressive appearing focal osseous lesion or focal pathologic process. Soft tissues and spinal canal: No prevertebral fluid or swelling. No visible canal hematoma. Upper chest: Diffuse bronchial wall thickening. Periapical pleural/pulmonary scarring. Other: Atherosclerotic plaque  of the aortic arch and its branches. IMPRESSION: 1. No acute intracranial abnormality. 2. No acute displaced fracture or traumatic listhesis of the cervical spine. 3.  Aortic Atherosclerosis (ICD10-I70.0). 4. Small airway disease. Electronically Signed   By: Iven Finn M.D.   On: 09/03/2022 22:34   DG Pelvis Portable  Result Date: 09/03/2022 CLINICAL DATA:  Syncope. EXAM: PORTABLE PELVIS 1-2 VIEWS COMPARISON:  None Available. FINDINGS: There is no evidence of an acute pelvic fracture or diastasis. No pelvic bone lesions are seen. Moderate severity degenerative changes are seen involving the bilateral hips in the form of joint space narrowing and acetabular sclerosis. Postoperative and degenerative changes seen within the visualized portion of the lumbar spine. IMPRESSION: 1. 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 2. Postoperative and degenerative changes within the lumbar spine. Electronically Signed   By: Virgina Norfolk M.D.   On: 09/03/2022 22:19   DG Chest Portable 1 View  Result Date: 09/03/2022 CLINICAL DATA:  Status post fall. EXAM: PORTABLE CHEST 1 VIEW COMPARISON:  May 20, 2021 FINDINGS: The cardiac silhouette is moderately enlarged and unchanged in size. There is moderate severity calcification of the aortic arch. Diffuse, chronic appearing increased interstitial lung markings are seen. There is no evidence of acute infiltrate, pleural effusion or pneumothorax. Multilevel degenerative changes are seen throughout the thoracic spine. IMPRESSION: Stable cardiomegaly and chronic appearing increased interstitial lung markings without evidence of acute or active cardiopulmonary disease. Electronically Signed   By: Virgina Norfolk M.D.   On: 09/03/2022 22:17     Assessment and Plan:   Near Syncope pt did have lightheadedness and could not think well, but she denies true syncope,  the confused state lasted for several min. Possible TIA - she has worn a monitor in past  for similar episodes.  No acute issues were seen.  Possible increase of BP at times causing a rushing sensation in the past, but this does seem somewhat different.  She lives alone,  the more we ask the more it seems she just tipped over on stool.  But to be thorough will add monitor at discharge.  If she goes today not sure we can place before discharge and would mail to her Mild elevation troponin to 21 X 1. Permanent atrial fib on eliquis, HR controlled.-no bleed on CT head HTN stable.  Hypomg+ has been replaced  Leg weakness has been increasing  at home.  May benefit from PT eval here.  No fx on XRAYs of pelvix.    Risk Assessment/Risk Scores:          CHA2DS2-VASc Score = 6   This indicates a 9.7% annual risk of stroke. The patient's score is based upon: CHF History: 0 HTN History: 1 Diabetes History: 0 Stroke History: 2 Vascular Disease History: 0 Age Score: 2 Gender Score: 1     For questions or updates, please contact [place] Please consult [email or web address] for contact info under    Signed, Cecilie Kicks, NP  09/04/2022 12:26 PM   I have seen and examined the patient along with Cecilie Kicks, NP.  I have reviewed the chart, notes and new data.  I agree with PA/NP's note.  Key  new complaints: Spent some time getting all the details about yesterday's event from the patient.  I do not think she had syncope.  It sounds like she simply toppled over and lost her balance.  She remembers the act of falling and never lost consciousness.  Just a few minutes ago, when orthostatic vital signs were being performed she required 2 people to assist her standing up, she felt dizzy and unsteady, despite the fact that her rhythm remained atrial fibrillation with controlled ventricular response.  There was no significant orthostatic drop in her blood pressure. Key examination changes: She appears elderly and frail, but actually appears younger than her stated age.  Irregular rhythm,  otherwise normal cardiovascular exam. Key new findings / data: No significant abnormalities on echocardiogram other than expected biatrial dilation.  Reviewed last year's arrhythmia monitor that showed atrial fibrillation with controlled ventricular rates and no severe bradycardia.  Telemetry in the 24-hour since her hospitalization has shown atrial fibrillation with controlled ventricular rates, no ventricular arrhythmia, no severe bradycardia.  PLAN: I think Mrs. 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  I do not think this was a true syncopal event.  It is not unreasonable for her to wear another event monitor to make sure we are not missing bradycardia, but this would be unusual in a patient with permanent atrial fibrillation and without previous evidence of AV block.  No changes recommended to her medications.  No further cardiac work-up is planned as an inpatient.  Sanda Klein, MD, [place] [phone] 09/04/2022, 2:34 PM

## 2022-09-04 NOTE — Assessment & Plan Note (Signed)
Continue Eliquis at 2.5 mg twice a day Continue Toprol 37.5 mg p.o. daily

## 2022-09-04 NOTE — Evaluation (Signed)
Clinical/Bedside Swallow Evaluation Patient Details  Name: Jessica Berg MRN: [id number] Date of Birth: 06-Sep-1924  Today's Date: 09/04/2022 Time: SLP Start Time (ACUTE ONLY): 63 SLP Stop Time (ACUTE ONLY): 1557 SLP Time Calculation (min) (ACUTE ONLY): 14 min  Past Medical History:  Past Medical History:  Diagnosis Date   Atrial fibrillation (HCC)    Chronic renal disease, stage II    GERD (gastroesophageal reflux disease)    Hypercholesteremia    MR (mitral regurgitation)    , Mild   Osteoarthritis    Osteoporosis    Past Surgical History:  Past Surgical History:  Procedure Laterality Date   BACK SURGERY     JOINT REPLACEMENT     HPI:  Pt is a 88 y.o. female who presented after fall. CT head negative. CXR negative for acute changes. PMH: A.fib on eliquis, hypertension, CKD stage II, GERD, HLD, mitral regurgitation, osteoporosis    Assessment / Plan / Recommendation  Clinical Impression  Pt was seen for bedside swallow evaluation with her daughter present. Both parties denied the pt having a history of oropharyngeal dysphagia and pt's ED RN denied observance of any symptoms of dysphagia today. Oral mechanism exam was [facility] and she presented with adequate, natural dentition. She tolerated all solids and liquids without signs or symptoms of oropharyngeal dysphagia. A regular texture diet with thin liquids (pt's current diet) is recommended at this time and further acute skilled SLP services are not clinically indicated for swallowing. SLP Visit Diagnosis: Dysphagia, unspecified (R13.10)    Aspiration Risk  No limitations    Diet Recommendation Regular;Thin liquid   Liquid Administration via: Cup;Straw Medication Administration: Whole meds with liquid Postural Changes: Seated upright at 90 degrees    Other  Recommendations Oral Care Recommendations: Oral care BID    Recommendations for follow up therapy are one component of a multi-disciplinary discharge planning process,  led by the attending physician.  Recommendations may be updated based on patient status, additional functional criteria and insurance authorization.  Follow up Recommendations No SLP follow up      Assistance Recommended at Discharge    Functional Status Assessment Patient has not had a recent decline in their functional status  Frequency and Duration            Prognosis Prognosis for Safe Diet Advancement: Good      Swallow Study   General Date of Onset: 09/04/22 HPI: Pt is a 88 y.o. female who presented after fall. CT head negative. CXR negative for acute changes. PMH: A.fib on eliquis, hypertension, CKD stage II, GERD, HLD, mitral regurgitation, osteoporosis Type of Study: Bedside Swallow Evaluation Previous Swallow Assessment: none Diet Prior to this Study: Regular;Thin liquids Temperature Spikes Noted: No Respiratory Status: Room air History of Recent Intubation: No Behavior/Cognition: Alert;Cooperative;Pleasant mood Oral Cavity Assessment: Within Functional Limits Oral Care Completed by SLP: No Oral Cavity - Dentition: Adequate natural dentition Vision: Functional for self-feeding Self-Feeding Abilities: Able to feed self Patient Positioning: Upright in bed;Postural control adequate for testing Baseline Vocal Quality: Normal Volitional Cough: Strong Volitional Swallow: Able to elicit    Oral/Motor/Sensory Function Overall Oral Motor/Sensory Function: Within functional limits   Ice Chips Ice chips: Not tested   Thin Liquid Thin Liquid: Within functional limits Presentation: Straw    Nectar Thick Nectar Thick Liquid: Not tested   Honey Thick Honey Thick Liquid: Not tested   Puree Puree: Within functional limits Presentation: Spoon   Solid     Solid: Within functional limits Presentation: Self  Fed     [place] [address], [place], [place] Office number [phone]  Horton Marshall 09/04/2022,3:59 PM

## 2022-09-04 NOTE — Progress Notes (Signed)
    I put in order for 7 day zio patch to be placed at discharge.  Cardiology will need to check on date of discharge

## 2022-09-04 NOTE — Progress Notes (Signed)
   09/04/22 2000  Clinical Encounter Type  Visited With Other (Comment)  Visit Type Spiritual support  Referral From Nurse  Consult/Referral To  ([place] - Shared consult with Father Barnabas Lister)   Chaplain received call from unit nurse regarding patient's request for [employer]. Father Barnabas Lister at [facility] was contacted via text and provided patient's location.  Melody Haver, Resident Chaplain [phone]

## 2022-09-04 NOTE — ED Notes (Signed)
Placed a external cath and a brief patient is now sitting up eating luch with family at bedside and call bell in reach

## 2022-09-04 NOTE — Assessment & Plan Note (Signed)
Cycle cardiac enzymes check orthostatics obtain echogram appreciate cardiology consult

## 2022-09-04 NOTE — Assessment & Plan Note (Signed)
Order robaxin, check electrolytes

## 2022-09-04 NOTE — Assessment & Plan Note (Signed)
Pt OT eval in AM

## 2022-09-04 NOTE — ED Notes (Signed)
Orthostatic VS performed, pt required 2 person assist to stand, was able to get back to bed with 1 person assist.  Stated she felt some dizzy and a little unsteady.

## 2022-09-04 NOTE — Progress Notes (Signed)
PROGRESS NOTE    Jessica Berg  [id number] DOB: [birth year] DOA: 09/03/2022 PCP: Lajean Manes, MD   Brief Narrative:  The patient is a 88 year old elderly Caucasian female with a past medical history significant for but not limited to atrial fibrillation on anticoagulation with Eliquis, hypertension, history of CKD stage IIIb, GERD, hyperlipidemia, mitral regurgitation, osteoporosis as well as other comorbidities who presented to the ED after a fall.  Patient states that she passed out and she was sitting in her highchair and thought that she had dropped something, turned her head and fell off the chair to the floor.  When she woke up she states her legs were extremely weak.  She noted that this happens occasionally and at baseline she lives independently can walk on her own power.  She denies any prodrome and states that when she turns her head she fell out of the chair and just did not know what happened and could not get up for at least 30 minutes.  She states that she crawled on the floor and has been seen by the EP cardiology in the past and did have a Holter monitor 2022.  She was noted at that time to have continuous atrial fibrillation throughout the recording with 100% burden with average heart rate of 67.  She feels a little bit better today and is undergoing echocardiogram.  Currently she is being treated for her atrial fibrillation hypertension as well as CKD stage II and because of her suspected syncope and collapse cardiology has been consulted and orthostatic vital signs are being obtained as well as PT and OT evaluation.  In the ED she had a CT of the head and neck done given her fall which is nonacute and she had a x-ray of her pelvis which showed moderate severity degenerative changes of the bilateral hips with postoperative and degenerative changes of the lumbar spine.  Chest x-ray was done and showed stable cardiomegaly and chronic appearing interstitial lung markings without  evidence of acute or active cardiopulmonary disease.  Currently she is being evaluated and treated for the following below  Assessment and Plan:  Suspected Syncope and Collapse -States she fell out of the chair and did not know what happened -TSH was 5.028 -UA Negative -CT Head and Cervical Spine w/o Contrast done and showed "No acute intracranial abnormality.  No acute displaced fracture or traumatic listhesis of the cervical spine. Aortic Atherosclerosis (ICD10-I70.0). Small airway disease." -Currently we are checking her cardiac troponins and troponin went from 17 -> 15 -> 21 -CK was 207 -Orthostatic vital signs are pending -Getting gentle IV fluid hydration with normal saline at 75 MLS per hour for 10 hours -Repeat echocardiogram is being obtained -Repeat basic laboratory data is pending as well -PT/OT to further evaluate and treat and this is pending -Cardiology has been consulted and awaiting further evaluation   Permanent atrial fibrillation ([place]) -Continue telemetry monitoring and -Continue anticoagulation with apixaban at 2.5 mg twice a day -Continue metoprolol succinate 37.5 mg p.o. daily   Essential hypertension -Continue Toprol at 37.5 mg p.o. daily -Continue monitor blood pressures per protocol -Last blood pressure reading was 142/85   Leg cramps -Mag level was on the low end of normal at 1.8 and potassium was 4.0 -Replete with IV mag sulfate 2 g and she was given a dose of Robaxin  Thrombocytopenia -Patient platelet count was 147 with repeat pending this morning -Continue monitor and trend and repeat CBC in a.m.  CKD stage IIIb -Has  a baseline creatinine of around 1.2 -Patient's BUN/creatinine is now 35/1.18 with repeat pending this morning -Currently getting IV fluid hydration with normal saline 75 mils per hour for 10 hours -We will need to avoid nephrotoxic medications, contrast dyes, hypotension and dehydration to ensure adequate renal perfusion and will need  to renally adjust medications -Repeat CMP in a.m.  Glaucoma -Continue home eyedrops with dorzolamide-timolol 22.3-6.8 mg/mL ophthalmic solution 1 drop in right eye twice daily as well as brimonidine 0.2% ophthalmic solution 1 drop to right eye twice daily   Physical debility and generalized weakness -We will obtain PT and OT to further evaluate and treat  DVT prophylaxis: apixaban (ELIQUIS) tablet 2.5 mg Start: 09/04/22 1000 apixaban (ELIQUIS) tablet 2.5 mg    Code Status: DNR Family Communication: No family currently at bedside  Disposition Plan:  Level of care: Telemetry Cardiac Status is: Observation The patient remains OBS appropriate and will d/c before 2 midnights.    Consultants:  Cardiology  Procedures:  As delineated above  Antimicrobials:  Anti-infectives (From admission, onward)    None       Subjective: Seen and examined at bedside and she is getting echocardiogram done.  She denies any chest pain or shortness of breath.  States that she did not know what happened yesterday but she states that she thought she did drop something and turned her head and then fell out of the chair and passed out.  She denies any other concerns or complaints at this time and states that she got extremely weak yesterday but states that she is doing little bit better today.  Still awaiting for physical therapy and cardiology to see her.  No other concerns or complaints at this time.  Objective: Vitals:   09/04/22 0155 09/04/22 0305 09/04/22 0535 09/04/22 0600  BP: (!) 153/90 (!) 140/86 131/67 (!) 142/85  Pulse: 79 95 75 83  Resp: (!) 25 19 18 16   Temp:   97.7 F (36.5 C)   TempSrc:   Oral   SpO2: 97% 97% 98% 100%  Weight:      Height:       No intake or output data in the 24 hours ending 09/04/22 0942 Filed Weights   09/03/22 2151  Weight: 56.2 kg   Examination: Physical Exam:  Constitutional: Thin elderly Caucasian female currently no acute distress Neck: Appears  normal, supple, no cervical masses, normal ROM, no appreciable thyromegaly; no JVD Respiratory: Diminished to auscultation bilaterally, no wheezing, rales, rhonchi or crackles. Normal respiratory effort and patient is not tachypenic. No accessory muscle use.  Unlabored breathing Cardiovascular: Irregularly irregular, no murmurs / rubs / gallops. S1 and S2 auscultated.  Mild extremity edema Abdomen: Soft, non-tender, non-distended. Bowel sounds positive.  GU: Deferred. Musculoskeletal: No clubbing / cyanosis of digits/nails. No joint deformity upper and lower extremities.  Skin: No rashes, lesions, ulcers on limited skin evaluation. No induration; Warm and dry.  Neurologic: CN 2-12 grossly intact with no focal deficits. Romberg sign and cerebellar reflexes not assessed.  Psychiatric: Normal judgment and insight. Alert and oriented x 3. Normal mood and appropriate affect.   Data Reviewed: I have personally reviewed following labs and imaging studies  CBC: Recent Labs  Lab 09/03/22 2155  WBC 7.0  NEUTROABS 5.4  HGB 13.1  HCT 40.6  MCV 98.8  PLT 147*   Basic Metabolic Panel: Recent Labs  Lab 09/03/22 2235 09/04/22 0057  NA 142  --   K 4.0  --   CL 109  --  CO2 26  --   GLUCOSE 106*  --   BUN 35*  --   CREATININE 1.18*  --   CALCIUM 9.4  --   MG  --  1.8  PHOS  --  2.8   GFR: Estimated Creatinine Clearance: 21.9 mL/min (A) (by C-G formula based on SCr of 1.18 mg/dL (H)). Liver Function Tests: Recent Labs  Lab 09/03/22 2235  AST 30  ALT 15  ALKPHOS 79  BILITOT 0.7  PROT 6.0*  ALBUMIN 3.7   No results for input(s): "LIPASE", "AMYLASE" in the last 168 hours. No results for input(s): "AMMONIA" in the last 168 hours. Coagulation Profile: No results for input(s): "INR", "PROTIME" in the last 168 hours. Cardiac Enzymes: Recent Labs  Lab 09/04/22 0057  CKTOTAL 207   BNP (last 3 results) No results for input(s): "PROBNP" in the last 8760 hours. HbA1C: No results  for input(s): "HGBA1C" in the last 72 hours. CBG: No results for input(s): "GLUCAP" in the last 168 hours. Lipid Profile: No results for input(s): "CHOL", "HDL", "LDLCALC", "TRIG", "CHOLHDL", "LDLDIRECT" in the last 72 hours. Thyroid Function Tests: Recent Labs    09/04/22 0306  TSH 5.028*   Anemia Panel: No results for input(s): "VITAMINB12", "FOLATE", "FERRITIN", "TIBC", "IRON", "RETICCTPCT" in the last 72 hours. Sepsis Labs: No results for input(s): "PROCALCITON", "LATICACIDVEN" in the last 168 hours.  No results found for this or any previous visit (from the past 240 hour(s)).   Radiology Studies: CT Head Wo Contrast  Result Date: 09/03/2022 CLINICAL DATA:  Head trauma, moderate-severe; Neck trauma (Age >= 65y). No contrast. Pt BIB EMS from home, lives alone. Was sitting at dining room table when she had syncopal episode. Pt states this happens when in a-fib. Pt crawled to phone to call EMS. C/o lightheadedness, denies pain/injury. EXAM: CT HEAD WITHOUT CONTRAST CT CERVICAL SPINE WITHOUT CONTRAST TECHNIQUE: Multidetector CT imaging of the head and cervical spine was performed following the standard protocol without intravenous contrast. Multiplanar CT image reconstructions of the cervical spine were also generated. RADIATION DOSE REDUCTION: This exam was performed according to the departmental dose-optimization program which includes automated exposure control, adjustment of the mA and/or kV according to patient size and/or use of iterative reconstruction technique. COMPARISON:  Chest x-ray 09/03/2022 FINDINGS: CT HEAD FINDINGS BRAIN: BRAIN Cerebral ventricle sizes are concordant with the degree of cerebral volume loss. Patchy and confluent areas of decreased attenuation are noted throughout the deep and periventricular white matter of the cerebral hemispheres bilaterally, compatible with chronic microvascular ischemic disease. No evidence of large-territorial acute infarction. No  parenchymal hemorrhage. No mass lesion. No extra-axial collection. No mass effect or midline shift. No hydrocephalus. Basilar cisterns are patent. Vascular: No hyperdense vessel. Skull: No acute fracture or focal lesion. Sinuses/Orbits: Paranasal sinuses and mastoid air cells are clear. Bilateral lens replacement. Otherwise the orbits are unremarkable. Other: None. CT CERVICAL SPINE FINDINGS Alignment: Grade 1 anterolisthesis of C3 on C4. Skull base and vertebrae: Multilevel moderate severe degenerative changes spine. No associated severe osseous neural foraminal stenosis. No acute fracture. No aggressive appearing focal osseous lesion or focal pathologic process. Soft tissues and spinal canal: No prevertebral fluid or swelling. No visible canal hematoma. Upper chest: Diffuse bronchial wall thickening. Periapical pleural/pulmonary scarring. Other: Atherosclerotic plaque of the aortic arch and its branches. IMPRESSION: 1. No acute intracranial abnormality. 2. No acute displaced fracture or traumatic listhesis of the cervical spine. 3.  Aortic Atherosclerosis (ICD10-I70.0). 4. Small airway disease. Electronically Signed   By:  Iven Finn M.D.   On: 09/03/2022 22:34   CT Cervical Spine Wo Contrast  Result Date: 09/03/2022 CLINICAL DATA:  Head trauma, moderate-severe; Neck trauma (Age >= 65y). No contrast. Pt BIB EMS from home, lives alone. Was sitting at dining room table when she had syncopal episode. Pt states this happens when in a-fib. Pt crawled to phone to call EMS. C/o lightheadedness, denies pain/injury. EXAM: CT HEAD WITHOUT CONTRAST CT CERVICAL SPINE WITHOUT CONTRAST TECHNIQUE: Multidetector CT imaging of the head and cervical spine was performed following the standard protocol without intravenous contrast. Multiplanar CT image reconstructions of the cervical spine were also generated. RADIATION DOSE REDUCTION: This exam was performed according to the departmental dose-optimization program which  includes automated exposure control, adjustment of the mA and/or kV according to patient size and/or use of iterative reconstruction technique. COMPARISON:  Chest x-ray 09/03/2022 FINDINGS: CT HEAD FINDINGS BRAIN: BRAIN Cerebral ventricle sizes are concordant with the degree of cerebral volume loss. Patchy and confluent areas of decreased attenuation are noted throughout the deep and periventricular white matter of the cerebral hemispheres bilaterally, compatible with chronic microvascular ischemic disease. No evidence of large-territorial acute infarction. No parenchymal hemorrhage. No mass lesion. No extra-axial collection. No mass effect or midline shift. No hydrocephalus. Basilar cisterns are patent. Vascular: No hyperdense vessel. Skull: No acute fracture or focal lesion. Sinuses/Orbits: Paranasal sinuses and mastoid air cells are clear. Bilateral lens replacement. Otherwise the orbits are unremarkable. Other: None. CT CERVICAL SPINE FINDINGS Alignment: Grade 1 anterolisthesis of C3 on C4. Skull base and vertebrae: Multilevel moderate severe degenerative changes spine. No associated severe osseous neural foraminal stenosis. No acute fracture. No aggressive appearing focal osseous lesion or focal pathologic process. Soft tissues and spinal canal: No prevertebral fluid or swelling. No visible canal hematoma. Upper chest: Diffuse bronchial wall thickening. Periapical pleural/pulmonary scarring. Other: Atherosclerotic plaque of the aortic arch and its branches. IMPRESSION: 1. No acute intracranial abnormality. 2. No acute displaced fracture or traumatic listhesis of the cervical spine. 3.  Aortic Atherosclerosis (ICD10-I70.0). 4. Small airway disease. Electronically Signed   By: Iven Finn M.D.   On: 09/03/2022 22:34   DG Pelvis Portable  Result Date: 09/03/2022 CLINICAL DATA:  Syncope. EXAM: PORTABLE PELVIS 1-2 VIEWS COMPARISON:  None Available. FINDINGS: There is no evidence of an acute pelvic fracture  or diastasis. No pelvic bone lesions are seen. Moderate severity degenerative changes are seen involving the bilateral hips in the form of joint space narrowing and acetabular sclerosis. Postoperative and degenerative changes seen within the visualized portion of the lumbar spine. IMPRESSION: 1. 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 2. Postoperative and degenerative changes within the lumbar spine. Electronically Signed   By: Virgina Norfolk M.D.   On: 09/03/2022 22:19   DG Chest Portable 1 View  Result Date: 09/03/2022 CLINICAL DATA:  Status post fall. EXAM: PORTABLE CHEST 1 VIEW COMPARISON:  May 20, 2021 FINDINGS: The cardiac silhouette is moderately enlarged and unchanged in size. There is moderate severity calcification of the aortic arch. Diffuse, chronic appearing increased interstitial lung markings are seen. There is no evidence of acute infiltrate, pleural effusion or pneumothorax. Multilevel degenerative changes are seen throughout the thoracic spine. IMPRESSION: Stable cardiomegaly and chronic appearing increased interstitial lung markings without evidence of acute or active cardiopulmonary disease. Electronically Signed   By: Virgina Norfolk M.D.   On: 09/03/2022 22:17    Scheduled Meds:  apixaban  2.5 mg Oral BID   brimonidine  1 drop  Right Eye BID   dorzolamide-timolol  1 drop Right Eye BID   magnesium oxide  400 mg Oral Daily   metoprolol succinate  37.5 mg Oral Daily   Continuous Infusions:  sodium chloride     methocarbamol (ROBAXIN) IV      LOS: 0 days   Raiford Noble, DO [facility] Available via Epic secure chat 7am-7pm After these hours, please refer to coverage provider listed on [email or web address] 09/04/2022, 9:42 AM

## 2022-09-04 NOTE — Assessment & Plan Note (Signed)
Continue Toprol at 37.5 mg p.o. daily

## 2022-09-05 DIAGNOSIS — R55 Syncope and collapse: Secondary | ICD-10-CM | POA: Diagnosis not present

## 2022-09-05 DIAGNOSIS — R5381 Other malaise: Secondary | ICD-10-CM | POA: Diagnosis not present

## 2022-09-05 DIAGNOSIS — I10 Essential (primary) hypertension: Secondary | ICD-10-CM | POA: Diagnosis not present

## 2022-09-05 DIAGNOSIS — R252 Cramp and spasm: Secondary | ICD-10-CM | POA: Diagnosis not present

## 2022-09-05 LAB — COMPREHENSIVE METABOLIC PANEL
ALT: 15 U/L (ref 0–44)
AST: 27 U/L (ref 15–41)
Albumin: 3.4 g/dL — ABNORMAL LOW (ref 3.5–5.0)
Alkaline Phosphatase: 66 U/L (ref 38–126)
Anion gap: 6 (ref 5–15)
BUN: 18 mg/dL (ref 8–23)
CO2: 25 mmol/L (ref 22–32)
Calcium: 9.2 mg/dL (ref 8.9–10.3)
Chloride: 110 mmol/L (ref 98–111)
Creatinine, Ser: 0.89 mg/dL (ref 0.44–1.00)
GFR, Estimated: 59 mL/min — ABNORMAL LOW (ref >60)
Glucose, Bld: 92 mg/dL (ref 70–99)
Potassium: 3.8 mmol/L (ref 3.5–5.1)
Sodium: 141 mmol/L (ref 135–145)
Total Bilirubin: 0.8 mg/dL (ref 0.3–1.2)
Total Protein: 6 g/dL — ABNORMAL LOW (ref 6.5–8.1)

## 2022-09-05 LAB — CBC WITH DIFFERENTIAL/PLATELET
Abs Immature Granulocytes: 0.02 10*3/uL (ref 0.00–0.07)
Basophils Absolute: 0 10*3/uL (ref 0.0–0.1)
Basophils Relative: 0 %
Eosinophils Absolute: 0.1 10*3/uL (ref 0.0–0.5)
Eosinophils Relative: 1 %
HCT: 39.3 % (ref 36.0–46.0)
Hemoglobin: 13 g/dL (ref 12.0–15.0)
Immature Granulocytes: 0 %
Lymphocytes Relative: 15 %
Lymphs Abs: 1 10*3/uL (ref 0.7–4.0)
MCH: 31.7 pg (ref 26.0–34.0)
MCHC: 33.1 g/dL (ref 30.0–36.0)
MCV: 95.9 fL (ref 80.0–100.0)
Monocytes Absolute: 0.7 10*3/uL (ref 0.1–1.0)
Monocytes Relative: 10 %
Neutro Abs: 5.1 10*3/uL (ref 1.7–7.7)
Neutrophils Relative %: 74 %
Platelets: 131 10*3/uL — ABNORMAL LOW (ref 150–400)
RBC: 4.1 MIL/uL (ref 3.87–5.11)
RDW: 12.1 % (ref 11.5–15.5)
WBC: 6.9 10*3/uL (ref 4.0–10.5)
nRBC: 0 % (ref 0.0–0.2)

## 2022-09-05 LAB — MAGNESIUM: Magnesium: 1.9 mg/dL (ref 1.7–2.4)

## 2022-09-05 LAB — PHOSPHORUS: Phosphorus: 3 mg/dL (ref 2.5–4.6)

## 2022-09-05 NOTE — Progress Notes (Signed)
PROGRESS NOTE    Jessica Berg  [id number] DOB: 04-Jun-1924 DOA: 09/03/2022 PCP: Lajean Manes, MD   Brief Narrative:  The patient is a 88 year old elderly Caucasian female with a past medical history significant for but not limited to atrial fibrillation on anticoagulation with Eliquis, hypertension, history of CKD stage IIIb, GERD, hyperlipidemia, mitral regurgitation, osteoporosis as well as other comorbidities who presented to the ED after a fall.  Patient states that she passed out and she was sitting in her highchair and thought that she had dropped something, turned her head and fell off the chair to the floor.  When she woke up she states her legs were extremely weak.  She noted that this happens occasionally and at baseline she lives independently can walk on her own power.  She denies any prodrome and states that when she turns her head she fell out of the chair and just did not know what happened and could not get up for at least 30 minutes.  She states that she crawled on the floor and has been seen by the EP cardiology in the past and did have a Holter monitor 2022.  She was noted at that time to have continuous atrial fibrillation throughout the recording with 100% burden with average heart rate of 67.  She feels a little bit better today and is undergoing echocardiogram.  Currently she is being treated for her atrial fibrillation hypertension as well as CKD stage II and because of her suspected syncope and collapse cardiology has been consulted and orthostatic vital signs are being obtained as well as PT and OT evaluation.  In the ED she had a CT of the head and neck done given her fall which is nonacute and she had a x-ray of her pelvis which showed moderate severity degenerative changes of the bilateral hips with postoperative and degenerative changes of the lumbar spine.  Chest x-ray was done and showed stable cardiomegaly and chronic appearing interstitial lung markings without  evidence of acute or active cardiopulmonary disease.  Currently she is being evaluated and treated for the following below   Assessment and Plan:  Suspected Syncope and Collapse, ruled out. Likely Near Syncope and Genearlized Weakness leading to Fall -States she fell out of the chair and did not know what happened -TSH was 5.028 -UA Negative -CT Head and Cervical Spine w/o Contrast done and showed "No acute intracranial abnormality.  No acute displaced fracture or traumatic listhesis of the cervical spine. Aortic Atherosclerosis (ICD10-I70.0). Small airway disease." -Currently we are checking her cardiac troponins and troponin went from 17 -> 15 -> 21 -CK was 207 -Orthostatic vital signs done and she did not drop -Getting gentle IV fluid hydration with normal saline at 75 MLS per hour for 10 hours has now stopped -Repeat echocardiogram is being obtained and as below -Repeat basic laboratory data is pending as well -PT/OT to further evaluate and treat they are recommending SNF; patient is in discussions about going home with home health services versus going to a skilled nursing facility -Cardiology has been consulted and awaiting further evaluation and they feel that she had difficulty with balance and generalized weakness related to her age and orthopedic issues rather than a true syncopal event; cardiology however is still recommending another event monitor to make sure that they are not missing bradycardia and recommending a Zio patch.  Cardiology recommends no further inpatient work-up at this time given that she had an echocardiogram   Permanent atrial fibrillation ([employer]) -Continue  telemetry monitoring and -Continue anticoagulation with apixaban at 2.5 mg twice a day -Continue metoprolol succinate 37.5 mg p.o. daily -Continue monitor on telemetry and she will be discharged with a event monitor   Essential Hypertension -Continue Toprol at 37.5 mg p.o. daily -Continue monitor blood  pressures per protocol -Last blood pressure reading was 148/93   Leg Cramps -Mag level was on the low end of normal at 1.8 and potassium was 4.0 -Replete with IV mag sulfate 2 g and she was given a dose of Robaxin   Thrombocytopenia -Patient platelet count was 147 and trended down and went to 137 yesterday and is 131 today  -Continue monitor and trend and repeat CBC in a.m.   CKD stage IIIb -Has a baseline creatinine of around 1.2 -Patient's BUN/creatinine is now improved from 35/1.18  -> 18/0.89 -Currently getting IV fluid hydration with normal saline 75 mils per hour for 10 hours -We will need to avoid nephrotoxic medications, contrast dyes, hypotension and dehydration to ensure adequate renal perfusion and will need to renally adjust medications -Repeat CMP in a.m.   Glaucoma -Continue home eyedrops with dorzolamide-timolol 22.3-6.8 mg/mL ophthalmic solution 1 drop in right eye twice daily as well as brimonidine 0.2% ophthalmic solution 1 drop to right eye twice daily   Physical debility and generalized weakness -We will obtain PT and OT to further evaluate and treat  DVT prophylaxis: apixaban (ELIQUIS) tablet 2.5 mg Start: 09/04/22 1045 apixaban (ELIQUIS) tablet 2.5 mg    Code Status: DNR Family Communication: Discussed with daughter at bedside  Disposition Plan:  Level of care: Telemetry Cardiac Status is: Observation The patient remains OBS appropriate and will d/c before 2 midnights.  Patient is to decide on whether going home with home health services if she can secure private care given that she does not have 24-hour supervision versus going to SNF   Consultants:  Cardiology  Procedures:  ECHOCARDIOGRAM IMPRESSIONS     1. Left ventricular ejection fraction, by estimation, is 55 to 60%. The  left ventricle has normal function. The left ventricle has no regional  wall motion abnormalities. Left ventricular diastolic function could not  be evaluated.   2. Right  ventricular systolic function is normal. The right ventricular  size is normal. There is mildly elevated pulmonary artery systolic  pressure. The estimated right ventricular systolic pressure is 99991111 mmHg.   3. Left atrial size was moderately dilated.   4. Right atrial size was moderately dilated.   5. The mitral valve is normal in structure. Trivial mitral valve  regurgitation. No evidence of mitral stenosis. Moderate mitral annular  calcification.   6. The aortic valve is tricuspid. There is mild calcification of the  aortic valve. There is mild thickening of the aortic valve. Aortic valve  regurgitation is mild. Aortic valve sclerosis is present, with no evidence  of aortic valve stenosis.   7. The inferior vena cava is normal in size with greater than 50%  respiratory variability, suggesting right atrial pressure of 3 mmHg.   Comparison(s): No prior Echocardiogram.   Conclusion(s)/Recommendation(s): Otherwise normal echocardiogram, with  minor abnormalities described in the report.   FINDINGS   Left Ventricle: Left ventricular ejection fraction, by estimation, is 55  to 60%. The left ventricle has normal function. The left ventricle has no  regional wall motion abnormalities. The left ventricular internal cavity  size was normal in size. There is   no left ventricular hypertrophy. Left ventricular diastolic function  could not be evaluated  due to atrial fibrillation. Left ventricular  diastolic function could not be evaluated.   Right Ventricle: The right ventricular size is normal. Right vetricular  wall thickness was not well visualized. Right ventricular systolic  function is normal. There is mildly elevated pulmonary artery systolic  pressure. The tricuspid regurgitant velocity   is 3.12 m/s, and with an assumed right atrial pressure of 3 mmHg, the  estimated right ventricular systolic pressure is 99991111 mmHg.   Left Atrium: Left atrial size was moderately dilated.   Right  Atrium: Right atrial size was moderately dilated.   Pericardium: There is no evidence of pericardial effusion.   Mitral Valve: The mitral valve is normal in structure. There is mild  thickening of the mitral valve leaflet(s). There is mild calcification of  the mitral valve leaflet(s). Moderate mitral annular calcification.  Trivial mitral valve regurgitation. No  evidence of mitral valve stenosis.   Tricuspid Valve: The tricuspid valve is normal in structure. Tricuspid  valve regurgitation is trivial. No evidence of tricuspid stenosis.   Aortic Valve: The aortic valve is tricuspid. There is mild calcification  of the aortic valve. There is mild thickening of the aortic valve. Aortic  valve regurgitation is mild. Aortic regurgitation PHT measures 723 msec.  Aortic valve sclerosis is present,   with no evidence of aortic valve stenosis.   Pulmonic Valve: The pulmonic valve was not well visualized. Pulmonic valve  regurgitation is mild. No evidence of pulmonic stenosis.   Aorta: The aortic root, ascending aorta and aortic arch are all  structurally normal, with no evidence of dilitation or obstruction.   Venous: The inferior vena cava is normal in size with greater than 50%  respiratory variability, suggesting right atrial pressure of 3 mmHg.   IAS/Shunts: The atrial septum is grossly normal.      LEFT VENTRICLE  PLAX 2D  LVIDd:         4.50 cm  LVIDs:         3.37 cm  LV PW:         0.80 cm  LV IVS:        0.80 cm  LVOT diam:     1.90 cm  LV SV:         44  LV SV Index:   29  LVOT Area:     2.84 cm     LV Volumes (MOD)  LV vol d, MOD A2C: 44.4 ml  LV vol d, MOD A4C: 42.4 ml  LV vol s, MOD A2C: 13.3 ml  LV vol s, MOD A4C: 14.5 ml  LV SV MOD A2C:     31.1 ml  LV SV MOD A4C:     42.4 ml  LV SV MOD BP:      29.3 ml   RIGHT VENTRICLE  RV S prime:     10.80 cm/s  TAPSE (M-mode): 2.0 cm   LEFT ATRIUM             Index        RIGHT ATRIUM           Index  LA Vol (A2C):    47.4 ml 31.13 ml/m  RA Area:     18.10 cm  LA Vol (A4C):   56.0 ml 36.77 ml/m  RA Volume:   55.90 ml  36.71 ml/m  LA Biplane Vol: 53.0 ml 34.80 ml/m   AORTIC VALVE  LVOT Vmax:   74.40 cm/s  LVOT Vmean:  53.000 cm/s  LVOT VTI:    0.154 m  AI PHT:      723 msec     AORTA  Ao Root diam: 2.90 cm  Ao Asc diam:  3.30 cm   MITRAL VALVE               TRICUSPID VALVE  MV Area (PHT): 4.21 cm    TR Peak grad:   38.9 mmHg  MV Decel Time: 180 msec    TR Vmax:        312.00 cm/s  MV E velocity: 83.10 cm/s  MV A velocity: 34.70 cm/s  SHUNTS  MV E/A ratio:  2.39        Systemic VTI:  0.15 m                             Systemic Diam: 1.90 cm   Antimicrobials:  Anti-infectives (From admission, onward)    None       Subjective: Seen and examined at bedside and she is sitting in the chair and she has no complaints at this time.  She states that she feels better today.  No nausea or vomiting.  Still debating about whether going to SNF versus going home with home health and hiring private care.  Family to decide this tonight.  No other concerns or complaints at this time.  Objective: Vitals:   09/04/22 1400 09/04/22 2039 09/05/22 0021 09/05/22 0957  BP: 134/71 (!) 155/77 133/73 (!) 148/93  Pulse: 85 84 82 79  Resp: (!) 25 16 14    Temp:  97.7 F (36.5 C) 97.7 F (36.5 C)   TempSrc:  Oral Oral   SpO2: 98% 100% 99%   Weight:      Height:        Intake/Output Summary (Last 24 hours) at 09/05/2022 1424 Last data filed at 09/05/2022 1003 Gross per 24 hour  Intake 1425 ml  Output 800 ml  Net 625 ml   Filed Weights   09/03/22 2151  Weight: 56.2 kg   Examination: Physical Exam:  Constitutional: WN/WD elderly Caucasian female currently no acute distress sitting in a chair appears calm Respiratory: Slight diminished to auscultation bilaterally, no wheezing, rales, rhonchi or crackles. Normal respiratory effort and patient is not tachypenic. No accessory muscle use.  Unlabored  breathing Cardiovascular: RRR, no murmurs / rubs / gallops. S1 and S2 auscultated.  Abdomen: Soft, non-tender, non-distended. Bowel sounds positive.  GU: Deferred. Musculoskeletal: No clubbing / cyanosis of digits/nails. No joint deformity upper and lower extremities.  Skin: No rashes, lesions, ulcers. No induration; Warm and dry.  Neurologic: CN 2-12 grossly intact with no focal deficits. Romberg sign and cerebellar reflexes not assessed.  Psychiatric: Normal judgment and insight. Alert and oriented x 3. Normal mood and appropriate affect.   Data Reviewed: I have personally reviewed following labs and imaging studies  CBC: Recent Labs  Lab 09/03/22 2155 09/04/22 1018 09/05/22 0501  WBC 7.0 5.9 6.9  NEUTROABS 5.4 3.9 5.1  HGB 13.1 12.4 13.0  HCT 40.6 38.3 39.3  MCV 98.8 96.2 95.9  PLT 147* 137* 732*   Basic Metabolic Panel: Recent Labs  Lab 09/03/22 2235 09/04/22 0057 09/04/22 1018 09/05/22 0501  NA 142  --  142 141  K 4.0  --  3.7 3.8  CL 109  --  107 110  CO2 26  --  25 25  GLUCOSE 106*  --  87 92  BUN 35*  --  25* 18  CREATININE 1.18*  --  0.95 0.89  CALCIUM 9.4  --  9.3 9.2  MG  --  1.8 1.5* 1.9  PHOS  --  2.8 2.8 3.0   GFR: Estimated Creatinine Clearance: 29.1 mL/min (by C-G formula based on SCr of 0.89 mg/dL). Liver Function Tests: Recent Labs  Lab 09/03/22 2235 09/04/22 1018 09/05/22 0501  AST 30 28 27   ALT 15 15 15   ALKPHOS 79 62 66  BILITOT 0.7 1.1 0.8  PROT 6.0* 5.7* 6.0*  ALBUMIN 3.7 3.4* 3.4*   No results for input(s): "LIPASE", "AMYLASE" in the last 168 hours. No results for input(s): "AMMONIA" in the last 168 hours. Coagulation Profile: No results for input(s): "INR", "PROTIME" in the last 168 hours. Cardiac Enzymes: Recent Labs  Lab 09/04/22 0057  CKTOTAL 207   BNP (last 3 results) No results for input(s): "PROBNP" in the last 8760 hours. HbA1C: No results for input(s): "HGBA1C" in the last 72 hours. CBG: No results for input(s):  "GLUCAP" in the last 168 hours. Lipid Profile: No results for input(s): "CHOL", "HDL", "LDLCALC", "TRIG", "CHOLHDL", "LDLDIRECT" in the last 72 hours. Thyroid Function Tests: Recent Labs    09/04/22 0306  TSH 5.028*   Anemia Panel: No results for input(s): "VITAMINB12", "FOLATE", "FERRITIN", "TIBC", "IRON", "RETICCTPCT" in the last 72 hours. Sepsis Labs: No results for input(s): "PROCALCITON", "LATICACIDVEN" in the last 168 hours.  No results found for this or any previous visit (from the past 240 hour(s)).   Radiology Studies: ECHOCARDIOGRAM COMPLETE  Result Date: 09/04/2022    ECHOCARDIOGRAM REPORT   Patient Name:   Jessica Berg Date of Exam: 09/04/2022 Medical Rec #:  [id number]         Height:       60.0 in Accession #:    [id number]        Weight:       123.9 lb Date of Birth:  06/06/1924         BSA:          1.523 m Patient Age:    71 years          BP:           112/55 mmHg Patient Gender: F                 HR:           89 bpm. Exam Location:  Inpatient Procedure: 2D Echo, Cardiac Doppler and Color Doppler Indications:    Syncope  History:        Patient has no prior history of Echocardiogram examinations.                 Arrythmias:Atrial Fibrillation; Risk Factors:Hypertension.  Sonographer:    Ronny Flurry Sonographer#2:  Roseanna Rainbow RDCS Referring Phys: [place]  1. Left ventricular ejection fraction, by estimation, is 55 to 60%. The left ventricle has normal function. The left ventricle has no regional wall motion abnormalities. Left ventricular diastolic function could not be evaluated.  2. Right ventricular systolic function is normal. The right ventricular size is normal. There is mildly elevated pulmonary artery systolic pressure. The estimated right ventricular systolic pressure is 99991111 mmHg.  3. Left atrial size was moderately dilated.  4. Right atrial size was moderately dilated.  5. The mitral valve is normal in structure. Trivial mitral valve  regurgitation. No evidence of mitral stenosis. Moderate mitral annular calcification.  6. The aortic valve is tricuspid. There  is mild calcification of the aortic valve. There is mild thickening of the aortic valve. Aortic valve regurgitation is mild. Aortic valve sclerosis is present, with no evidence of aortic valve stenosis.  7. The inferior vena cava is normal in size with greater than 50% respiratory variability, suggesting right atrial pressure of 3 mmHg. Comparison(s): No prior Echocardiogram. Conclusion(s)/Recommendation(s): Otherwise normal echocardiogram, with minor abnormalities described in the report. FINDINGS  Left Ventricle: Left ventricular ejection fraction, by estimation, is 55 to 60%. The left ventricle has normal function. The left ventricle has no regional wall motion abnormalities. The left ventricular internal cavity size was normal in size. There is  no left ventricular hypertrophy. Left ventricular diastolic function could not be evaluated due to atrial fibrillation. Left ventricular diastolic function could not be evaluated. Right Ventricle: The right ventricular size is normal. Right vetricular wall thickness was not well visualized. Right ventricular systolic function is normal. There is mildly elevated pulmonary artery systolic pressure. The tricuspid regurgitant velocity  is 3.12 m/s, and with an assumed right atrial pressure of 3 mmHg, the estimated right ventricular systolic pressure is 99991111 mmHg. Left Atrium: Left atrial size was moderately dilated. Right Atrium: Right atrial size was moderately dilated. Pericardium: There is no evidence of pericardial effusion. Mitral Valve: The mitral valve is normal in structure. There is mild thickening of the mitral valve leaflet(s). There is mild calcification of the mitral valve leaflet(s). Moderate mitral annular calcification. Trivial mitral valve regurgitation. No evidence of mitral valve stenosis. Tricuspid Valve: The tricuspid valve is  normal in structure. Tricuspid valve regurgitation is trivial. No evidence of tricuspid stenosis. Aortic Valve: The aortic valve is tricuspid. There is mild calcification of the aortic valve. There is mild thickening of the aortic valve. Aortic valve regurgitation is mild. Aortic regurgitation PHT measures 723 msec. Aortic valve sclerosis is present,  with no evidence of aortic valve stenosis. Pulmonic Valve: The pulmonic valve was not well visualized. Pulmonic valve regurgitation is mild. No evidence of pulmonic stenosis. Aorta: The aortic root, ascending aorta and aortic arch are all structurally normal, with no evidence of dilitation or obstruction. Venous: The inferior vena cava is normal in size with greater than 50% respiratory variability, suggesting right atrial pressure of 3 mmHg. IAS/Shunts: The atrial septum is grossly normal.  LEFT VENTRICLE PLAX 2D LVIDd:         4.50 cm LVIDs:         3.37 cm LV PW:         0.80 cm LV IVS:        0.80 cm LVOT diam:     1.90 cm LV SV:         44 LV SV Index:   29 LVOT Area:     2.84 cm  LV Volumes (MOD) LV vol d, MOD A2C: 44.4 ml LV vol d, MOD A4C: 42.4 ml LV vol s, MOD A2C: 13.3 ml LV vol s, MOD A4C: 14.5 ml LV SV MOD A2C:     31.1 ml LV SV MOD A4C:     42.4 ml LV SV MOD BP:      29.3 ml RIGHT VENTRICLE RV S prime:     10.80 cm/s TAPSE (M-mode): 2.0 cm LEFT ATRIUM             Index        RIGHT ATRIUM           Index LA Vol (A2C):   47.4 ml 31.13 ml/m  RA  Area:     18.10 cm LA Vol (A4C):   56.0 ml 36.77 ml/m  RA Volume:   55.90 ml  36.71 ml/m LA Biplane Vol: 53.0 ml 34.80 ml/m  AORTIC VALVE LVOT Vmax:   74.40 cm/s LVOT Vmean:  53.000 cm/s LVOT VTI:    0.154 m AI PHT:      723 msec  AORTA Ao Root diam: 2.90 cm Ao Asc diam:  3.30 cm MITRAL VALVE               TRICUSPID VALVE MV Area (PHT): 4.21 cm    TR Peak grad:   38.9 mmHg MV Decel Time: 180 msec    TR Vmax:        312.00 cm/s MV E velocity: 83.10 cm/s MV A velocity: 34.70 cm/s  SHUNTS MV E/A ratio:  2.39         Systemic VTI:  0.15 m                            Systemic Diam: 1.90 cm Buford Dresser MD Electronically signed by Buford Dresser MD Signature Date/Time: 09/04/2022/12:27:53 PM    Final    CT Head Wo Contrast  Result Date: 09/03/2022 CLINICAL DATA:  Head trauma, moderate-severe; Neck trauma (Age >= 65y). No contrast. Pt BIB EMS from home, lives alone. Was sitting at dining room table when she had syncopal episode. Pt states this happens when in a-fib. Pt crawled to phone to call EMS. C/o lightheadedness, denies pain/injury. EXAM: CT HEAD WITHOUT CONTRAST CT CERVICAL SPINE WITHOUT CONTRAST TECHNIQUE: Multidetector CT imaging of the head and cervical spine was performed following the standard protocol without intravenous contrast. Multiplanar CT image reconstructions of the cervical spine were also generated. RADIATION DOSE REDUCTION: This exam was performed according to the departmental dose-optimization program which includes automated exposure control, adjustment of the mA and/or kV according to patient size and/or use of iterative reconstruction technique. COMPARISON:  Chest x-ray 09/03/2022 FINDINGS: CT HEAD FINDINGS BRAIN: BRAIN Cerebral ventricle sizes are concordant with the degree of cerebral volume loss. Patchy and confluent areas of decreased attenuation are noted throughout the deep and periventricular white matter of the cerebral hemispheres bilaterally, compatible with chronic microvascular ischemic disease. No evidence of large-territorial acute infarction. No parenchymal hemorrhage. No mass lesion. No extra-axial collection. No mass effect or midline shift. No hydrocephalus. Basilar cisterns are patent. Vascular: No hyperdense vessel. Skull: No acute fracture or focal lesion. Sinuses/Orbits: Paranasal sinuses and mastoid air cells are clear. Bilateral lens replacement. Otherwise the orbits are unremarkable. Other: None. CT CERVICAL SPINE FINDINGS Alignment: Grade 1 anterolisthesis of  C3 on C4. Skull base and vertebrae: Multilevel moderate severe degenerative changes spine. No associated severe osseous neural foraminal stenosis. No acute fracture. No aggressive appearing focal osseous lesion or focal pathologic process. Soft tissues and spinal canal: No prevertebral fluid or swelling. No visible canal hematoma. Upper chest: Diffuse bronchial wall thickening. Periapical pleural/pulmonary scarring. Other: Atherosclerotic plaque of the aortic arch and its branches. IMPRESSION: 1. No acute intracranial abnormality. 2. No acute displaced fracture or traumatic listhesis of the cervical spine. 3.  Aortic Atherosclerosis (ICD10-I70.0). 4. Small airway disease. Electronically Signed   By: Iven Finn M.D.   On: 09/03/2022 22:34   CT Cervical Spine Wo Contrast  Result Date: 09/03/2022 CLINICAL DATA:  Head trauma, moderate-severe; Neck trauma (Age >= 65y). No contrast. Pt BIB EMS from home, lives alone. Was sitting at dining room  table when she had syncopal episode. Pt states this happens when in a-fib. Pt crawled to phone to call EMS. C/o lightheadedness, denies pain/injury. EXAM: CT HEAD WITHOUT CONTRAST CT CERVICAL SPINE WITHOUT CONTRAST TECHNIQUE: Multidetector CT imaging of the head and cervical spine was performed following the standard protocol without intravenous contrast. Multiplanar CT image reconstructions of the cervical spine were also generated. RADIATION DOSE REDUCTION: This exam was performed according to the departmental dose-optimization program which includes automated exposure control, adjustment of the mA and/or kV according to patient size and/or use of iterative reconstruction technique. COMPARISON:  Chest x-ray 09/03/2022 FINDINGS: CT HEAD FINDINGS BRAIN: BRAIN Cerebral ventricle sizes are concordant with the degree of cerebral volume loss. Patchy and confluent areas of decreased attenuation are noted throughout the deep and periventricular white matter of the cerebral  hemispheres bilaterally, compatible with chronic microvascular ischemic disease. No evidence of large-territorial acute infarction. No parenchymal hemorrhage. No mass lesion. No extra-axial collection. No mass effect or midline shift. No hydrocephalus. Basilar cisterns are patent. Vascular: No hyperdense vessel. Skull: No acute fracture or focal lesion. Sinuses/Orbits: Paranasal sinuses and mastoid air cells are clear. Bilateral lens replacement. Otherwise the orbits are unremarkable. Other: None. CT CERVICAL SPINE FINDINGS Alignment: Grade 1 anterolisthesis of C3 on C4. Skull base and vertebrae: Multilevel moderate severe degenerative changes spine. No associated severe osseous neural foraminal stenosis. No acute fracture. No aggressive appearing focal osseous lesion or focal pathologic process. Soft tissues and spinal canal: No prevertebral fluid or swelling. No visible canal hematoma. Upper chest: Diffuse bronchial wall thickening. Periapical pleural/pulmonary scarring. Other: Atherosclerotic plaque of the aortic arch and its branches. IMPRESSION: 1. No acute intracranial abnormality. 2. No acute displaced fracture or traumatic listhesis of the cervical spine. 3.  Aortic Atherosclerosis (ICD10-I70.0). 4. Small airway disease. Electronically Signed   By: Iven Finn M.D.   On: 09/03/2022 22:34   DG Pelvis Portable  Result Date: 09/03/2022 CLINICAL DATA:  Syncope. EXAM: PORTABLE PELVIS 1-2 VIEWS COMPARISON:  None Available. FINDINGS: There is no evidence of an acute pelvic fracture or diastasis. No pelvic bone lesions are seen. Moderate severity degenerative changes are seen involving the bilateral hips in the form of joint space narrowing and acetabular sclerosis. Postoperative and degenerative changes seen within the visualized portion of the lumbar spine. IMPRESSION: 1. 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 2. Postoperative and degenerative changes within the lumbar spine.  Electronically Signed   By: Virgina Norfolk M.D.   On: 09/03/2022 22:19   DG Chest Portable 1 View  Result Date: 09/03/2022 CLINICAL DATA:  Status post fall. EXAM: PORTABLE CHEST 1 VIEW COMPARISON:  May 20, 2021 FINDINGS: The cardiac silhouette is moderately enlarged and unchanged in size. There is moderate severity calcification of the aortic arch. Diffuse, chronic appearing increased interstitial lung markings are seen. There is no evidence of acute infiltrate, pleural effusion or pneumothorax. Multilevel degenerative changes are seen throughout the thoracic spine. IMPRESSION: Stable cardiomegaly and chronic appearing increased interstitial lung markings without evidence of acute or active cardiopulmonary disease. Electronically Signed   By: Virgina Norfolk M.D.   On: 09/03/2022 22:17    Scheduled Meds:  apixaban  2.5 mg Oral BID   brimonidine  1 drop Right Eye BID   dorzolamide-timolol  1 drop Right Eye BID   magnesium oxide  400 mg Oral Daily   metoprolol succinate  37.5 mg Oral Daily   Continuous Infusions:  methocarbamol (ROBAXIN) IV      LOS: 0  days   Raiford Noble, DO [facility] Available via Epic secure chat 7am-7pm After these hours, please refer to coverage provider listed on [email or web address] 09/05/2022, 2:24 PM

## 2022-09-05 NOTE — TOC Initial Note (Addendum)
Transition of Care [facility]) - Initial/Assessment Note    Patient Details  Name: Jessica Berg MRN: [id number] Date of Birth: 22-Jun-1924  [facility]) CM/SW Contact:    Bethena Roys, RN Phone Number: 09/05/2022, 1:42 PM  Clinical Narrative:  Patient presented for syncope. PTA patient was from home alone; however, has support of two daughters. Daughter Butch Penny is at the bedside and we discussed possible home with home health services and personal care aides to come in as supplemental. Case Manager provided patient and daughter with the Medicare.gov list and personal care aide company brochures. Family is aware that the personal care has to be arranged via family and that the cost ranges from $21-$29.00 an hour. Daughter also wanted a list for SNF's. Case Manager provided the family with the list. Family wants to discuss which is the best option SNF vs HH. Case Manager will continue to follow the patient as she progresses.              1508 09-05-22 Family has asked that the patients clinicals to be faxed out to [facility] and Pennybyrn-CSW has completed this for the patient. Awaiting to hear back from the facilities.  Expected Discharge Plan: [place] Barriers to Discharge: Continued Medical Work up   Patient Goals and CMS Choice     Choice offered to / list presented to : Adult Children, Patient  Expected Discharge Plan and Services Expected Discharge Plan: [place] In-house Referral: Clinical Social Work Discharge Planning Services: CM Consult Post Acute Care Choice: [place] arrangements for the past 2 months: [place]                 DME Arranged: N/A DME Agency: NA    Prior Living Arrangements/Services Living arrangements for the past 2 months: Single Family Home Lives with:: Self (family support of daughters.) Patient language and need for interpreter  reviewed:: Yes Do you feel safe going back to the place where you live?: Yes      Need for Family Participation in Patient Care: Yes (Comment) Care giver support system in place?: Yes (comment) Current home services: DME (Pt has cane and rolling walker.) Criminal Activity/Legal Involvement Pertinent to Current Situation/Hospitalization: No - Comment as needed  Activities of Daily Living Home Assistive Devices/Equipment: None ADL Screening (condition at time of admission) Patient's cognitive ability adequate to safely complete daily activities?: Yes Is the patient deaf or have difficulty hearing?: Yes Does the patient have difficulty seeing, even when wearing glasses/contacts?: Yes Does the patient have difficulty concentrating, remembering, or making decisions?: No Patient able to express need for assistance with ADLs?: Yes Does the patient have difficulty dressing or bathing?: Yes Independently performs ADLs?: No Communication: Independent Dressing (OT): Needs assistance Is this a change from baseline?: Change from baseline, expected to last <3days Grooming: Independent Feeding: Independent Bathing: Needs assistance Is this a change from baseline?: Change from baseline, expected to last <3 days Toileting: Needs assistance Is this a change from baseline?: Change from baseline, expected to last <3 days In/Out Bed: Needs assistance Is this a change from baseline?: Change from baseline, expected to last <3 days Walks in Home: Needs assistance Is this a change from baseline?: Change from baseline, expected to last <3 days Does the patient have difficulty walking or climbing stairs?: Yes Weakness of Legs: Both Weakness of Arms/Hands: Both  Permission Sought/Granted Permission sought to share information  with : Family Supports, Case Manager   Emotional Assessment Appearance:: Appears stated age Attitude/Demeanor/Rapport: Engaged Affect (typically observed): Appropriate Orientation:  : Oriented to Situation, Oriented to  Time, Oriented to Place, Oriented to Self Alcohol / Substance Use: Not Applicable Psych Involvement: No (comment)  Admission diagnosis:  Syncope and collapse [R55] Syncope [R55] Fall, initial encounter [W19.XXXA] Patient Active Problem List   Diagnosis Date Noted   Leg cramps 09/04/2022   Debility 09/04/2022   Syncope 09/03/2022   Essential hypertension 09/18/2021   Encounter for long-term (current) use of other medications 09/09/2014   Tremor 09/09/2014   Hypercoagulable state due to atrial fibrillation ([place]) 03/09/2014   Tremulousness 03/09/2014   Hypercholesteremia    Permanent atrial fibrillation ([place])    MR (mitral regurgitation)    PCP:  Lajean Manes, MD Pharmacy:   [id number] [address], [place] [address] Phone: [phone] Fax: [phone]  [facility] - [place], [place] [address] [place] [address] Phone: [phone] Fax: [phone]  Readmission Risk Interventions     No data to display

## 2022-09-05 NOTE — Evaluation (Signed)
Occupational Therapy Evaluation Patient Details Name: Jessica Berg MRN: [id number] DOB: 03-23-1924 Today's Date: 09/05/2022   History of Present Illness 88 y.o. female presents to [facility] hospital on 09/03/2022 after syncope and fall. PMH includes afib, HTN, syncope, CKD II, GERD, HLD, osteoporosis.   Clinical Impression   Pt walks with a cane and is independent in light meal prep, light housekeeping and self care. Presents with generalized weakness and pre-syncopal symptoms with bending over to don socks and in standing. Pt is generally weak and requires min assist and RW for OOB mobility and set up to mod assist for ADLs. Pt's daughter present and reports her sister is planning to find a caregiver to stay with pt when she returns home. Will recommend SNF until care is secured.      Recommendations for follow up therapy are one component of a multi-disciplinary discharge planning process, led by the attending physician.  Recommendations may be updated based on patient status, additional functional criteria and insurance authorization.   Follow Up Recommendations  Skilled nursing-short term rehab (<3 hours/day) (HHOT if family can secure 24 hour care)    Assistance Recommended at Discharge Frequent or constant Supervision/Assistance  Patient can return home with the following A little help with walking and/or transfers;A lot of help with bathing/dressing/bathroom;Assistance with cooking/housework;Assist for transportation;Help with stairs or ramp for entrance;Direct supervision/assist for medications management    Functional Status Assessment  Patient has had a recent decline in their functional status and demonstrates the ability to make significant improvements in function in a reasonable and predictable amount of time.  Equipment Recommendations  None recommended by OT    Recommendations for Other Services       Precautions / Restrictions Precautions Precautions: Fall Precaution  Comments: pt with pre-syncopal symptoms leaning forward to don socks Restrictions Weight Bearing Restrictions: No      Mobility Bed Mobility               General bed mobility comments: in chair    Transfers Overall transfer level: Needs assistance Equipment used: Rolling walker (2 wheels) Transfers: Sit to/from Stand, Bed to chair/wheelchair/BSC Sit to Stand: Min assist     Step pivot transfers: Min assist     General transfer comment: x 2      Balance Overall balance assessment: Needs assistance   Sitting balance-Leahy Scale: Fair     Standing balance support: Bilateral upper extremity supported, Reliant on assistive device for balance Standing balance-Leahy Scale: Poor                             ADL either performed or assessed with clinical judgement   ADL Overall ADL's : Needs assistance/impaired Eating/Feeding: Independent;Sitting   Grooming: Set up;Sitting   Upper Body Bathing: Set up;Sitting   Lower Body Bathing: Moderate assistance;Sit to/from stand   Upper Body Dressing : Set up;Sitting   Lower Body Dressing: Sit to/from stand;Moderate assistance   Toilet Transfer: Minimal assistance;Stand-pivot;Rolling walker (2 wheels);BSC/3in1   Toileting- Clothing Manipulation and Hygiene: Minimal assistance;Sit to/from stand       Functional mobility during ADLs: Minimal assistance;Rolling walker (2 wheels)       Vision Ability to See in Adequate Light: 0 Adequate Patient Visual Report: No change from baseline       Perception     Praxis      Pertinent Vitals/Pain Pain Assessment Pain Assessment: Faces Faces Pain Scale: Hurts little more Pain Location:  L hip Pain Descriptors / Indicators: Aching Pain Intervention(s): Monitored during session, Repositioned     Hand Dominance Right   Extremity/Trunk Assessment Upper Extremity Assessment Upper Extremity Assessment: Overall WFL for tasks assessed   Lower Extremity  Assessment Lower Extremity Assessment: Defer to PT evaluation   Cervical / Trunk Assessment Cervical / Trunk Assessment: Kyphotic   Communication Communication Communication: HOH   Cognition Arousal/Alertness: Awake/alert Behavior During Therapy: WFL for tasks assessed/performed Overall Cognitive Status: Impaired/Different from baseline                       Memory: Decreased short-term memory       Problem Solving: Slow processing General Comments: HOH contributing     General Comments  pt in afib during session with rate spiking from 80s to 120s with changes in positioning. Pt BP stable during session, however pt reports symptoms of lightheadedness and dizziness when mobilizing. Orthostatic vitals documented in vitals flowsheet    Exercises     Shoulder Instructions      Home Living Family/patient expects to be discharged to:: Private residence Living Arrangements: Alone Available Help at Discharge: Family;Available PRN/intermittently Type of Home: House Home Access: Stairs to enter;Ramped entrance Entrance Stairs-Number of Steps: 3   Home Layout: One level     Bathroom Shower/Tub: Producer, television/film/video: Standard     Home Equipment: Agricultural consultant (2 wheels);Cane - single point;Grab bars - toilet;Grab bars - tub/shower;Toilet riser;Adaptive equipment Adaptive Equipment: Sock aid;Reacher Additional Comments: AE from previous back sx, does not use      Prior Functioning/Environment Prior Level of Function : Independent/Modified Independent             Mobility Comments: use of SPC with mobility ADLs Comments: stands to shower        OT Problem List: Decreased strength;Decreased activity tolerance;Impaired balance (sitting and/or standing);Decreased cognition;Decreased knowledge of use of DME or AE;Cardiopulmonary status limiting activity      OT Treatment/Interventions: Self-care/ADL training;DME and/or AE instruction;Therapeutic  activities;Cognitive remediation/compensation;Patient/family education;Balance training    OT Goals(Current goals can be found in the care plan section) Acute Rehab OT Goals OT Goal Formulation: With patient Time For Goal Achievement: 09/19/22 Potential to Achieve Goals: Good ADL Goals Pt Will Perform Grooming: with supervision;standing Pt Will Perform Lower Body Bathing: with supervision;sit to/from stand Pt Will Perform Lower Body Dressing: with supervision;sit to/from stand Pt Will Transfer to Toilet: with supervision;ambulating;regular height toilet Pt Will Perform Toileting - Clothing Manipulation and hygiene: with supervision;sit to/from stand Additional ADL Goal #1: Pt will gather items necessary for ADLs around her room with supervision and RW.  OT Frequency: Min 2X/week    Co-evaluation              AM-PAC OT "6 Clicks" Daily Activity     Outcome Measure Help from another person eating meals?: None Help from another person taking care of personal grooming?: A Little Help from another person toileting, which includes using toliet, bedpan, or urinal?: A Little Help from another person bathing (including washing, rinsing, drying)?: A Lot Help from another person to put on and taking off regular upper body clothing?: A Little Help from another person to put on and taking off regular lower body clothing?: A Lot 6 Click Score: 17   End of Session Equipment Utilized During Treatment: Rolling walker (2 wheels);Gait belt Nurse Communication: Mobility status  Activity Tolerance: Treatment limited secondary to medical complications (Comment) (feeling woozy) Patient left:  in chair;with call bell/phone within reach;with chair alarm set;with family/visitor present  OT Visit Diagnosis: Unsteadiness on feet (R26.81);Other abnormalities of gait and mobility (R26.89);Dizziness and giddiness (R42);Muscle weakness (generalized) (M62.81);Other symptoms and signs involving cognitive function                 Time: 1055-1110 OT Time Calculation (min): 15 min Charges:  OT General Charges $OT Visit: 1 Visit OT Evaluation $OT Eval Moderate Complexity: 1 Mod  Berna Spare, OTR/L Acute Rehabilitation Services Office: [phone]  Evern Bio 09/05/2022, 12:05 PM

## 2022-09-05 NOTE — Evaluation (Signed)
Physical Therapy Evaluation Patient Details Name: Jessica Berg MRN: [id number] DOB: [birth year] Today's Date: 09/05/2022  History of Present Illness  88 y.o. female presents to [facility] hospital on 09/03/2022 after syncope and fall. PMH includes afib, HTN, syncope, CKD II, GERD, HLD, osteoporosis.  Clinical Impression  Pt presents to PT with deficits in strength, power, gait, balance, endurance, functional mobility. Pt reports dizziness and fuzzy vision with changes in position from supine to sitting and sitting to standing, similar to what she experienced at home. Pt currently benefits from assistance from sitting to standing due to LE weakness, and remains at an increased risk for falls due to imbalance. Pt and family reports limited assistance from family is available at this time, and pt would likely need assistance for all out of bed mobility if returning home currently. PT recommends SNF placement at this time, however if PCA services are able to be obtained the patient may be able to return home with HHPT.       Recommendations for follow up therapy are one component of a multi-disciplinary discharge planning process, led by the attending physician.  Recommendations may be updated based on patient status, additional functional criteria and insurance authorization.  Follow Up Recommendations Skilled nursing-short term rehab (<3 hours/day) (seems that pt and family would prefer to go home if private care assistance is able to be obtained however family not able to assist enough at this time) Can patient physically be transported by private vehicle: Yes    Assistance Recommended at Discharge Frequent or constant Supervision/Assistance  Patient can return home with the following  A little help with walking and/or transfers;A little help with bathing/dressing/bathroom;Assistance with cooking/housework;Direct supervision/assist for financial management;Direct supervision/assist for medications  management;Help with stairs or ramp for entrance;Assist for transportation    Equipment Recommendations BSC/3in1 (pt owns a RW)  Recommendations for Other Services       Functional Status Assessment Patient has had a recent decline in their functional status and demonstrates the ability to make significant improvements in function in a reasonable and predictable amount of time.     Precautions / Restrictions Precautions Precautions: Fall Precaution Comments: pre-syncopal symptoms reported with mobility (dizziness, blurred vision) Restrictions Weight Bearing Restrictions: No      Mobility  Bed Mobility Overal bed mobility: Needs Assistance Bed Mobility: Supine to Sit     Supine to sit: Min assist, HOB elevated     General bed mobility comments: use of rails    Transfers Overall transfer level: Needs assistance Equipment used: 1 person hand held assist, Rolling walker (2 wheels) Transfers: Sit to/from Stand, Bed to chair/wheelchair/BSC Sit to Stand: Min assist   Step pivot transfers: Min assist       General transfer comment: initial sit to stand and step pivot with hand hold, subsequent sit to stand with RW. Pt requires assist to slow descent likely related to limited knee flexion due to OA    Ambulation/Gait Ambulation/Gait assistance: Min guard Gait Distance (Feet): 25 Feet (additional trial of 15') Assistive device: Rolling walker (2 wheels) Gait Pattern/deviations: Step-through pattern Gait velocity: reduced Gait velocity interpretation: <1.31 ft/sec, indicative of household ambulator   General Gait Details: pt with slowed step-through gait, reduced stride length bilaterally  Stairs            Wheelchair Mobility    Modified Rankin (Stroke Patients Only)       Balance Overall balance assessment: Needs assistance Sitting-balance support: No upper extremity supported, Feet supported Sitting balance-Leahy  Scale: Fair     Standing balance  support: Bilateral upper extremity supported, Reliant on assistive device for balance Standing balance-Leahy Scale: Poor                               Pertinent Vitals/Pain Pain Assessment Pain Assessment: Faces Faces Pain Scale: Hurts little more Pain Location: L hip (chronic from bursitis) Pain Descriptors / Indicators: Aching Pain Intervention(s): Limited activity within patient's tolerance    Home Living Family/patient expects to be discharged to:: Private residence Living Arrangements: Alone Available Help at Discharge: Family;Available PRN/intermittently Type of Home: House Home Access: Stairs to enter;Ramped entrance   Entrance Stairs-Number of Steps: 3   Home Layout: One level Home Equipment: Conservation officer, nature (2 wheels);Cane - single point;Grab bars - toilet;Grab bars - tub/shower;Toilet riser      Prior Function Prior Level of Function : Independent/Modified Independent             Mobility Comments: use of SPC with mobility       Hand Dominance        Extremity/Trunk Assessment   Upper Extremity Assessment Upper Extremity Assessment: Generalized weakness    Lower Extremity Assessment Lower Extremity Assessment: Generalized weakness    Cervical / Trunk Assessment Cervical / Trunk Assessment: Kyphotic  Communication   Communication: Expressive difficulties (intermittent word finding difficulties)  Cognition Arousal/Alertness: Awake/alert Behavior During Therapy: WFL for tasks assessed/performed Overall Cognitive Status: Impaired/Different from baseline Area of Impairment: Memory, Problem solving                     Memory: Decreased short-term memory       Problem Solving: Slow processing          General Comments General comments (skin integrity, edema, etc.): pt in afib during session with rate spiking from 80s to 120s with changes in positioning. Pt BP stable during session, however pt reports symptoms of  lightheadedness and dizziness when mobilizing. Orthostatic vitals documented in vitals flowsheet    Exercises     Assessment/Plan    PT Assessment Patient needs continued PT services  PT Problem List Decreased strength;Decreased activity tolerance;Decreased mobility;Decreased balance;Decreased cognition;Decreased safety awareness;Decreased knowledge of use of DME;Decreased knowledge of precautions;Cardiopulmonary status limiting activity       PT Treatment Interventions Gait training;DME instruction;Functional mobility training;Therapeutic activities;Therapeutic exercise;Stair training;Balance training;Neuromuscular re-education;Cognitive remediation;Patient/family education    PT Goals (Current goals can be found in the Care Plan section)  Acute Rehab PT Goals Patient Stated Goal: to return to baseline PT Goal Formulation: With patient/family Time For Goal Achievement: 09/19/22 Potential to Achieve Goals: Fair    Frequency Min 3X/week     Co-evaluation               AM-PAC PT "6 Clicks" Mobility  Outcome Measure Help needed turning from your back to your side while in a flat bed without using bedrails?: A Little Help needed moving from lying on your back to sitting on the side of a flat bed without using bedrails?: A Little Help needed moving to and from a bed to a chair (including a wheelchair)?: A Little Help needed standing up from a chair using your arms (e.g., wheelchair or bedside chair)?: A Little Help needed to walk in hospital room?: A Little Help needed climbing 3-5 steps with a railing? : Total 6 Click Score: 16    End of Session   Activity Tolerance: Patient  tolerated treatment well Patient left: in chair;with call bell/phone within reach;with chair alarm set;with family/visitor present Nurse Communication: Mobility status PT Visit Diagnosis: Other abnormalities of gait and mobility (R26.89);Muscle weakness (generalized) (M62.81)    Time: 7615-1834 PT  Time Calculation (min) (ACUTE ONLY): 52 min   Charges:   PT Evaluation $PT Eval Low Complexity: 1 Low PT Treatments $Gait Training: 8-22 mins $Therapeutic Activity: 8-22 mins        Arlyss Gandy, PT, DPT Acute Rehabilitation Office [phone]   Arlyss Gandy 09/05/2022, 10:07 AM

## 2022-09-05 NOTE — Care Management Obs Status (Signed)
[place] NOTIFICATION   Patient Details  Name: Jessica Berg MRN: [id number] Date of Birth: [birth year]   Medicare Observation Status Notification Given:  Yes    Bethena Roys, RN 09/05/2022, 10:44 AM

## 2022-09-05 NOTE — Progress Notes (Addendum)
Initial Nutrition Assessment  DOCUMENTATION CODES:  Not applicable  INTERVENTION:  -Liberalize diet to regular -Monitor po intake during admission  NUTRITION DIAGNOSIS:  Inadequate oral intake related to other (see comment) (restrictive diet during admission) as evidenced by meal completion < 50%.  GOAL:  Patient will meet greater than or equal to 90% of their needs  MONITOR:  PO intake  REASON FOR ASSESSMENT:  Consult Assessment of nutrition requirement/status  ASSESSMENT:   Pt is a 88yo F with PMH significant for afib on Eliquis, HTN, CKD stage II, GERD, HLD, mitral valve regurgitation, and osteoporosis, admitted after a fall.  Visited pt at bedside with daughter present. Pt reports she has a great appetite, eats 3 meals per day and snacks on sweets with her tea throughout the day. Daughter confirmed pt eats a healthy well balanced diet, has no diet restrictions and is maintaining hydration at home. Diet recall likely meeting estimated nutrient needs. Weight has been relatively stable, but daughter reports a few pound weight loss with aging. Normally pt gets around pretty well at home but this admission here with fall/sudden weakness. Clothes have been fitting normally. Muscle loss appears age related. Pt and daughter have no nutrition related questions or concerns at this time. Discussed liberalizing diet to regular to allow more options. Pt agreeable and reports cardiac diet has affected po intake during admission.  Medications reviewed and include: eliquis, magnesium oxide Labs reviewed  NUTRITION - FOCUSED PHYSICAL EXAM:  Flowsheet Row Most Recent Value  Orbital Region Mild depletion  Upper Arm Region No depletion  Thoracic and Lumbar Region No depletion  Buccal Region No depletion  Temple Region Mild depletion  Clavicle Bone Region Moderate depletion  Clavicle and Acromion Bone Region Moderate depletion  Scapular Bone Region Unable to assess  Dorsal Hand No depletion   Patellar Region Mild depletion  Anterior Thigh Region No depletion  Posterior Calf Region No depletion  Edema (RD Assessment) None  Hair Reviewed  Eyes Reviewed  Mouth Reviewed  Skin Reviewed  Nails Reviewed      Diet Order:   Diet Order             Diet Heart Room service appropriate? Yes; Fluid consistency: Thin  Diet effective now                  EDUCATION NEEDS:  No education needs have been identified at this time  Skin:  Skin Assessment: Reviewed RN Assessment  Last BM:  last BM unknown  Height:  Ht Readings from Last 1 Encounters:  09/03/22 5' (1.524 m)   Weight:  Wt Readings from Last 1 Encounters:  09/03/22 56.2 kg   BMI:  Body mass index is 24.2 kg/m.  Estimated Nutritional Needs:   Kcal:  1400-1685kcal  Protein:  60-70g  Fluid:  166mL   Candise Bowens, MS, RD, LDN, CNSC See AMi[facility] for contact information

## 2022-09-06 DIAGNOSIS — W19XXXA Unspecified fall, initial encounter: Secondary | ICD-10-CM | POA: Diagnosis not present

## 2022-09-06 DIAGNOSIS — R55 Syncope and collapse: Secondary | ICD-10-CM | POA: Diagnosis not present

## 2022-09-06 DIAGNOSIS — I10 Essential (primary) hypertension: Secondary | ICD-10-CM | POA: Diagnosis not present

## 2022-09-06 DIAGNOSIS — Y92009 Unspecified place in unspecified non-institutional (private) residence as the place of occurrence of the external cause: Secondary | ICD-10-CM

## 2022-09-06 DIAGNOSIS — N1832 Chronic kidney disease, stage 3b: Secondary | ICD-10-CM

## 2022-09-06 DIAGNOSIS — R5381 Other malaise: Secondary | ICD-10-CM | POA: Diagnosis not present

## 2022-09-06 DIAGNOSIS — R531 Weakness: Secondary | ICD-10-CM

## 2022-09-06 DIAGNOSIS — H409 Unspecified glaucoma: Secondary | ICD-10-CM

## 2022-09-06 LAB — CBC WITH DIFFERENTIAL/PLATELET
Abs Immature Granulocytes: 0.02 10*3/uL (ref 0.00–0.07)
Basophils Absolute: 0 10*3/uL (ref 0.0–0.1)
Basophils Relative: 0 %
Eosinophils Absolute: 0.1 10*3/uL (ref 0.0–0.5)
Eosinophils Relative: 1 %
HCT: 41.2 % (ref 36.0–46.0)
Hemoglobin: 13.6 g/dL (ref 12.0–15.0)
Immature Granulocytes: 0 %
Lymphocytes Relative: 14 %
Lymphs Abs: 1.2 10*3/uL (ref 0.7–4.0)
MCH: 31.5 pg (ref 26.0–34.0)
MCHC: 33 g/dL (ref 30.0–36.0)
MCV: 95.4 fL (ref 80.0–100.0)
Monocytes Absolute: 1.1 10*3/uL — ABNORMAL HIGH (ref 0.1–1.0)
Monocytes Relative: 13 %
Neutro Abs: 6.1 10*3/uL (ref 1.7–7.7)
Neutrophils Relative %: 72 %
Platelets: 137 10*3/uL — ABNORMAL LOW (ref 150–400)
RBC: 4.32 MIL/uL (ref 3.87–5.11)
RDW: 11.9 % (ref 11.5–15.5)
WBC: 8.5 10*3/uL (ref 4.0–10.5)
nRBC: 0 % (ref 0.0–0.2)

## 2022-09-06 LAB — COMPREHENSIVE METABOLIC PANEL
ALT: 14 U/L (ref 0–44)
AST: 28 U/L (ref 15–41)
Albumin: 3.5 g/dL (ref 3.5–5.0)
Alkaline Phosphatase: 62 U/L (ref 38–126)
Anion gap: 7 (ref 5–15)
BUN: 18 mg/dL (ref 8–23)
CO2: 26 mmol/L (ref 22–32)
Calcium: 9.3 mg/dL (ref 8.9–10.3)
Chloride: 103 mmol/L (ref 98–111)
Creatinine, Ser: 0.87 mg/dL (ref 0.44–1.00)
GFR, Estimated: >60 mL/min (ref >60)
Glucose, Bld: 104 mg/dL — ABNORMAL HIGH (ref 70–99)
Potassium: 4 mmol/L (ref 3.5–5.1)
Sodium: 136 mmol/L (ref 135–145)
Total Bilirubin: 1 mg/dL (ref 0.3–1.2)
Total Protein: 6.4 g/dL — ABNORMAL LOW (ref 6.5–8.1)

## 2022-09-06 LAB — PHOSPHORUS: Phosphorus: 2.7 mg/dL (ref 2.5–4.6)

## 2022-09-06 LAB — MAGNESIUM: Magnesium: 1.7 mg/dL (ref 1.7–2.4)

## 2022-09-06 MED ORDER — CELECOXIB 200 MG PO CAPS
200.0000 mg | ORAL_CAPSULE | Freq: Every day | ORAL | Status: DC
  Administered 2022-09-06: 200 mg via ORAL
  Filled 2022-09-06: qty 1

## 2022-09-06 MED ORDER — MAGNESIUM SULFATE 2 GM/50ML IV SOLN
2.0000 g | Freq: Once | INTRAVENOUS | Status: AC
  Administered 2022-09-06: 2 g via INTRAVENOUS
  Filled 2022-09-06: qty 50

## 2022-09-06 MED ORDER — TRAMADOL HCL 50 MG PO TABS: 50.0000 mg | ORAL_TABLET | Freq: Two times a day (BID) | ORAL | Status: DC | PRN

## 2022-09-06 NOTE — TOC Transition Note (Signed)
Transition of Care [facility]) - CM/SW Discharge Note   Patient Details  Name: Jessica Berg MRN: [id number] Date of Birth: 01/12/1924  [facility] of [facility]) CM/SW Contact:  Bethena Roys, RN Phone Number: 09/06/2022, 12:41 PM   Clinical Narrative:  Family has decided to take the patient home with home health services instead of going to a facility. Case Manager had provided the Medicare.gov list the day before for family to review. Family chose [facility] for home health services and they have a personal care agency as well for aide services to supplement care. Referral submitted to [facility] and the agency can service the patient. Start of care to begin within 24-48 hours post transition home. DME bedside commode and hospital bed ordered via [employer]. DME bedside commode delivered to the room and the hospital bed to be delivered to the home later today. Family at the bedside and they will transport the patient home via private vehicle. No further needs identified at this time.   Final next level of care: [place] Barriers to Discharge: No Barriers Identified   Patient Goals and CMS Choice Patient states their goals for this hospitalization and ongoing recovery are:: to return home CMS Medicare.gov Compare Post Acute Care list provided to:: Other (Comment Required) (Adult children) Choice offered to / list presented to : Patient, Adult Children  Discharge Plan and Services In-house Referral: Clinical Social Work Discharge Planning Services: CM Consult Post Acute Care Choice: Home Health, Durable Medical Equipment          DME Arranged: Hospital bed, Bedside commode DME Agency: [employer] Date DME Agency Contacted: 09/06/22 Time DME Agency Contacted: 0045 Representative spoke with at DME Agency: [place]: PT, OT, Nurse's Aide [place] Agency: [place] Date [place]: 09/06/22 Time [place]: 1210 Representative spoke  with at [place]: Tommi Rumps  Readmission Risk Interventions     No data to display

## 2022-09-06 NOTE — TOC Progression Note (Signed)
Transition of Care [facility]) - Progression Note    Patient Details  Name: Jessica Berg MRN: [id number] Date of Birth: 12-31-1923  Transition of Care [facility]) CM/SW [place], [place] Phone Number: 09/06/2022, 10:01 AM  Clinical Narrative:   CSW spoke with daughter, Butch Penny, to provide update on SNF preferences. Neither [facility] nor [facility] will be able to offer a bed. Butch Penny indicated understanding, and plan is to take the patient home with caregiver support. CSW provided hand off to [facility] to contact family about setting up home health services.     Expected Discharge Plan: [place] Barriers to Discharge: Continued Medical Work up  Expected Discharge Plan and Services Expected Discharge Plan: [place] In-house Referral: Clinical Social Work Discharge Planning Services: CM Consult Post Acute Care Choice: [place] arrangements for the past 2 months: Single Family Home                 DME Arranged: N/A DME Agency: NA                   Social Determinants of Health (SDOH) Interventions    Readmission Risk Interventions     No data to display

## 2022-09-06 NOTE — Progress Notes (Signed)
Physical Therapy Treatment Patient Details Name: Jessica Berg MRN: [id number] DOB: 1924-04-18 Today's Date: 09/06/2022   History of Present Illness 88 y.o. female presents to [facility] hospital on 09/03/2022 after syncope and fall. PMH includes afib, HTN, syncope, CKD II, GERD, HLD, osteoporosis.    PT Comments    Pt limited by LLE pain at this time. Pt has a history of L hip bursitis and BLE OA which often limit her mobility. Pt continues to require physical assistance to transfer as well as assistance for safety when ambulating for limited distances. Pt will benefit from continued aggressive mobilization in an effort to reduce stiffness and improve activity tolerance. PT continues to recommend SNF placement as the pt currently requires assistance for all functional mobility, and she does not have this level of care available at home currently.    Recommendations for follow up therapy are one component of a multi-disciplinary discharge planning process, led by the attending physician.  Recommendations may be updated based on patient status, additional functional criteria and insurance authorization.  Follow Up Recommendations  Skilled nursing-short term rehab (<3 hours/day) (family may elect to pursue HHPT if private care can be secured) Can patient physically be transported by private vehicle: Yes   Assistance Recommended at Discharge Frequent or constant Supervision/Assistance  Patient can return home with the following A little help with walking and/or transfers;A little help with bathing/dressing/bathroom;Assistance with cooking/housework;Direct supervision/assist for financial management;Direct supervision/assist for medications management;Help with stairs or ramp for entrance;Assist for transportation   Equipment Recommendations  BSC/3in1    Recommendations for Other Services       Precautions / Restrictions Precautions Precautions: Fall Precaution Comments: pt reports fuzziness when  mobilizing Restrictions Weight Bearing Restrictions: No     Mobility  Bed Mobility Overal bed mobility: Needs Assistance Bed Mobility: Supine to Sit     Supine to sit: Min assist, HOB elevated          Transfers Overall transfer level: Needs assistance Equipment used: Rolling walker (2 wheels) Transfers: Sit to/from Stand Sit to Stand: Min assist           General transfer comment: verbal cues for increased forward lean, physical assistance to power up    Ambulation/Gait Ambulation/Gait assistance: Min guard Gait Distance (Feet): 15 Feet Assistive device: Rolling walker (2 wheels) Gait Pattern/deviations: Step-to pattern, Decreased stance time - left Gait velocity: reduced Gait velocity interpretation: <1.31 ft/sec, indicative of household ambulator   General Gait Details: pt with slowed step-to gait, reduced stance time on LLE. Verbal cues to maintain RW further forward of BOS to allow for distance to step   Stairs             Wheelchair Mobility    Modified Rankin (Stroke Patients Only)       Balance Overall balance assessment: Needs assistance Sitting-balance support: No upper extremity supported, Feet supported Sitting balance-Leahy Scale: Fair     Standing balance support: Bilateral upper extremity supported, Reliant on assistive device for balance Standing balance-Leahy Scale: Poor                              Cognition Arousal/Alertness: Awake/alert Behavior During Therapy: WFL for tasks assessed/performed Overall Cognitive Status: Impaired/Different from baseline Area of Impairment: Memory                     Memory: Decreased short-term memory  Exercises      General Comments General comments (skin integrity, edema, etc.): PT notes hypotension after ambulation 93/53 (pre-mobility BP of 141/71). Pt reports fuzziness when mobilizing, however she also reports vision deficits at baseline. BP  after resting in recliner improves to 108/62.      Pertinent Vitals/Pain Pain Assessment Pain Assessment: Faces Faces Pain Scale: Hurts whole lot Pain Location: L hip and knee Pain Descriptors / Indicators: Aching Pain Intervention(s): Monitored during session    Home Living                          Prior Function            PT Goals (current goals can now be found in the care plan section) Acute Rehab PT Goals Patient Stated Goal: to return to baseline Progress towards PT goals: Not progressing toward goals - comment (limited by LE pain)    Frequency    Min 3X/week (maintain 3x until SNF vs HHPT confirmed)      PT Plan Current plan remains appropriate    Co-evaluation              AM-PAC PT "6 Clicks" Mobility   Outcome Measure  Help needed turning from your back to your side while in a flat bed without using bedrails?: A Little Help needed moving from lying on your back to sitting on the side of a flat bed without using bedrails?: A Little Help needed moving to and from a bed to a chair (including a wheelchair)?: A Little Help needed standing up from a chair using your arms (e.g., wheelchair or bedside chair)?: A Little Help needed to walk in hospital room?: Total Help needed climbing 3-5 steps with a railing? : Total 6 Click Score: 14    End of Session   Activity Tolerance: Patient limited by pain Patient left: in chair;with call bell/phone within reach;with chair alarm set Nurse Communication: Mobility status PT Visit Diagnosis: Other abnormalities of gait and mobility (R26.89);Muscle weakness (generalized) (M62.81)     Time: 2035-5974 PT Time Calculation (min) (ACUTE ONLY): 46 min  Charges:  $Gait Training: 8-22 mins $Therapeutic Activity: 23-37 mins                     Arlyss Gandy, PT, DPT Acute Rehabilitation Office [phone]    Arlyss Gandy 09/06/2022, 9:44 AM

## 2022-09-06 NOTE — Discharge Summary (Signed)
Physician Discharge Summary   Patient: Jessica Berg MRN: [id number] DOB: 1924-08-24  Admit date:     09/03/2022  Discharge date: 09/06/22  Discharge Physician: Raiford Noble, DO   PCP: Lajean Manes, MD   Recommendations at discharge:   Follow up with PCP within 1-2 weeks and repeat CBC, CMP, Mag, Phos, within 1 week  Follow up with Cardiology within 1-2 weeks and c/w Zio Patch   Discharge Diagnoses: Principal Problem:   Fall at home, initial encounter Active Problems:   Permanent atrial fibrillation ([place])   Essential hypertension   Syncope   Leg cramps   Debility   Near syncope   Generalized weakness   Stage 3b chronic kidney disease (CKD) ([place])   Glaucoma  Resolved Problems:   * No resolved hospital problems. [facility] [place] [facility] Course: The patient is a 88 year old elderly Caucasian female with a past medical history significant for but not limited to atrial fibrillation on anticoagulation with Eliquis, hypertension, history of CKD stage IIIb, GERD, hyperlipidemia, mitral regurgitation, osteoporosis as well as other comorbidities who presented to the ED after a fall.  Patient states that she passed out and she was sitting in her highchair and thought that she had dropped something, turned her head and fell off the chair to the floor.  When she woke up she states her legs were extremely weak.  She noted that this happens occasionally and at baseline she lives independently can walk on her own power.  She denies any prodrome and states that when she turns her head she fell out of the chair and just did not know what happened and could not get up for at least 30 minutes.  She states that she crawled on the floor and has been seen by the EP cardiology in the past and did have a Holter monitor 2022.  She was noted at that time to have continuous atrial fibrillation throughout the recording with 100% burden with average heart rate of 67.  She feels a little bit better today and is undergoing  echocardiogram.  Currently she is being treated for her atrial fibrillation hypertension as well as CKD stage II and because of her suspected syncope and collapse cardiology has been consulted and orthostatic vital signs are being obtained as well as PT and OT evaluation.  In the ED she had a CT of the head and neck done given her fall which is nonacute and she had a x-ray of her pelvis which showed moderate severity degenerative changes of the bilateral hips with postoperative and degenerative changes of the lumbar spine.  Chest x-ray was done and showed stable cardiomegaly and chronic appearing interstitial lung markings without evidence of acute or active cardiopulmonary disease.  Currently she is being evaluated and treated for the following below.  She improved and cardiology evaluated and signed off her case and recommended outpatient follow-up with her primary cardiologist Dr. Marlou Porch.  Prior to discharge they recommended a Zio patch and this will be mailed to the patient given that it was not placed on her prior to discharge.  She is medically stable to be discharged at this time and PT OT recommended SNF but she does not want to go to SNF so she will pursue home health services and primary caregivers.  Assessment and Plan:  Suspected Syncope and Collapse, ruled out. Likely Near Syncope and Genearlized Weakness leading to Fall -States she fell out of the chair and did not know what happened -TSH was 5.028 -UA Negative -  CT Head and Cervical Spine w/o Contrast done and showed "No acute intracranial abnormality.  No acute displaced fracture or traumatic listhesis of the cervical spine. Aortic Atherosclerosis (ICD10-I70.0). Small airway disease." -Currently we are checking her cardiac troponins and troponin went from 17 -> 15 -> 21 -CK was 207 -Orthostatic vital signs done and she did not drop -Getting gentle IV fluid hydration with normal saline at 75 MLS per hour for 10 hours has now  stopped -Repeat echocardiogram is being obtained and as below -Repeat basic laboratory data is pending as well -PT/OT to further evaluate and treat they are recommending SNF; patient is in discussions about going home with home health services versus going to a skilled nursing facility -Cardiology has been consulted and awaiting further evaluation and they feel that she had difficulty with balance and generalized weakness related to her age and orthopedic issues rather than a true syncopal event; cardiology however is still recommending another event monitor to make sure that they are not missing bradycardia and recommending a Zio patch.  Cardiology recommends no further inpatient work-up at this time given that she had an echocardiogram -SNF was pursued but patient did not want to go those SNF's so she will be discharged home with home health and set up primary caregivers   Permanent atrial fibrillation ([place]) -Continue telemetry monitoring and -Continue anticoagulation with apixaban at 2.5 mg twice a day -Continue metoprolol succinate 37.5 mg p.o. daily -Continue monitor on telemetry and she will be discharged with a event monitor and cardiology arranging this   Essential Hypertension -Continue Toprol at 37.5 mg p.o. daily -Continue monitor blood pressures per protocol -Last blood pressure reading was 141/71   Leg Cramps, improved -Mag level was on the low end of normal at 1.8 and potassium was 4.0 -Replete with IV mag sulfate 2 g and she was given a dose of Robaxin   Thrombocytopenia -Patient platelet count was is now 137 and stable -Continue monitor and trend and repeat CBC in a.m.   CKD stage IIIb -Has a baseline creatinine of around 1.2 -Patient's BUN/creatinine is now improved from 35/1.18  -> 18/0.89 and further trended down to 18/0.87 -Currently getting IV fluid hydration with normal saline 75 mils per hour for 10 hours -We will need to avoid nephrotoxic medications, contrast  dyes, hypotension and dehydration to ensure adequate renal perfusion and will need to renally adjust medications -Repeat CMP in a.m.   Glaucoma -Continue home eyedrops with dorzolamide-timolol 22.3-6.8 mg/mL ophthalmic solution 1 drop in right eye twice daily as well as brimonidine 0.2% ophthalmic solution 1 drop to right eye twice daily   Physical debility and generalized weakness -We will obtain PT and OT to further evaluate and treat and they recommended SNF but patient does not want to go to SNF so we will go home with home health therapy and higher prior care givers given that she is medically stable for discharge  Nutrition Documentation    [facility] ED to Hosp-Admission (Discharged) from 09/03/2022 in [facility]  Nutrition Problem Inadequate oral intake  Etiology other (see comment)  [restrictive diet during admission]  Nutrition Goal Patient will meet greater than or equal to 90% of their needs  Interventions Liberalize Diet       Consultants: Cardiology  Procedures performed: Echocardiogram Disposition:  Recommendation was for SNF but patient did not want to do this so we will go home with home health services and higher private duty caregivers Diet recommendation:  Discharge Diet Orders (From admission, onward)     Start     Ordered   09/06/22 0000  Diet - low sodium heart healthy        09/06/22 1330           Cardiac diet DISCHARGE MEDICATION: Allergies as of 09/06/2022       Reactions   Darvocet [propoxyphene N-acetaminophen] Nausea And Vomiting   Lodine [etodolac] Hives   Unknown to patient   Meprozine [meperidine-promethazine]    Reaction=tongue swelling   Percocet [oxycodone-acetaminophen] Nausea And Vomiting   Dizziness    Ultracet [tramadol-acetaminophen] Other (See Comments)   headache   Demerol Hives   Hydrocodone Itching   Penicillins Rash        Medication List     TAKE these medications    apixaban 2.5 MG Tabs  tablet Commonly known as: Eliquis Take 1 tablet (2.5 mg total) by mouth 2 (two) times daily.   ascorbic acid 500 MG tablet Commonly known as: VITAMIN C Take 500 mg by mouth daily.   brimonidine 0.2 % ophthalmic solution Commonly known as: ALPHAGAN Place 1 drop into the right eye 2 (two) times daily.   Calcium Carbonate-Vitamin D3 600-400 MG-UNIT Tabs Take 1 tablet by mouth daily.   carboxymethylcellulose 0.5 % Soln Commonly known as: REFRESH PLUS Place 1 drop into both eyes See admin instructions. Instill 1 drop into both eyes every hour   celecoxib 200 MG capsule Commonly known as: CELEBREX Take 200 mg by mouth daily.   cholecalciferol 1000 units tablet Commonly known as: VITAMIN D Take 1,000 Units by mouth daily.   dorzolamide-timolol 22.3-6.8 MG/ML ophthalmic solution Commonly known as: COSOPT Place 1 drop into the right eye 2 (two) times daily.   metoprolol succinate 25 MG 24 hr tablet Commonly known as: TOPROL-XL TAKE ONE AND ONE-HALF TABLETS DAILY               Durable Medical Equipment  (From admission, onward)           Start     Ordered   09/06/22 1152  For home use only DME Hospital bed  Once       Question Answer Comment  Length of Need 6 Months   Patient has (list medical condition): Generalized Weakness and Physical Decondition   The above medical condition requires: Patient requires the ability to reposition frequently   Bed type Semi-electric      09/06/22 1152            Follow-up Information     [facility] Follow up.   Specialty: Home Health Services Why: Physical Therapy, Occupational Therapy, Aide-office to call with a visit time. Contact information: [address] [place] [address] [phone]         [facility] Follow up.   Why: Bedside Commode delivered to the room. Hospital bed to be delivered to the home. Contact information: [place]  [address] [phone]                Discharge Exam: Filed Weights   09/03/22 2151  Weight: 56.2 kg   Vitals:   09/06/22 0741 09/06/22 0838  BP: (!) (P) 138/94 (!) 141/71  Pulse: (P) 93 76  Resp: (P) 13   Temp: (P) 98.8 F (37.1 C)   SpO2: (P) 95%    Examination: Physical Exam:  Constitutional: Thin elderly Caucasian female in no acute distress appears calm sitting in  the chair Respiratory: Slight diminished to auscultation bilaterally, no wheezing, rales, rhonchi or crackles. Normal respiratory effort and patient is not tachypenic. No accessory muscle use.  Unlabored breathing Cardiovascular: RRR, no murmurs / rubs / gallops. S1 and S2 auscultated. No extremity edema.  Abdomen: Soft, non-tender, not really distended.  Bowel sounds positive.  GU: Deferred. Musculoskeletal: No clubbing / cyanosis of digits/nails. No joint deformity upper and lower extremities. Skin: No rashes, lesions, ulcers on limited skin evaluation. No induration; Warm and dry.  Neurologic: CN 2-12 grossly intact with no focal deficits. Romberg sign and cerebellar reflexes not assessed.  Psychiatric: Normal judgment and insight. Alert and oriented x 3. Normal mood and appropriate affect.   Condition at discharge: stable  The results of significant diagnostics from this hospitalization (including imaging, microbiology, ancillary and laboratory) are listed below for reference.   Imaging Studies: ECHOCARDIOGRAM COMPLETE  Result Date: 09/04/2022    ECHOCARDIOGRAM REPORT   Patient Name:   JOULES PRUDENCIO Date of Exam: 09/04/2022 Medical Rec #:  [id number]         Height:       60.0 in Accession #:    [id number]        Weight:       123.9 lb Date of Birth:  1924/08/27         BSA:          1.523 m Patient Age:    17 years          BP:           112/55 mmHg Patient Gender: F                 HR:           89 bpm. Exam Location:  Inpatient Procedure: 2D Echo, Cardiac Doppler and Color Doppler Indications:     Syncope  History:        Patient has no prior history of Echocardiogram examinations.                 Arrythmias:Atrial Fibrillation; Risk Factors:Hypertension.  Sonographer:    Ronny Flurry Sonographer#2:  Roseanna Rainbow RDCS Referring Phys: [place]  1. Left ventricular ejection fraction, by estimation, is 55 to 60%. The left ventricle has normal function. The left ventricle has no regional wall motion abnormalities. Left ventricular diastolic function could not be evaluated.  2. Right ventricular systolic function is normal. The right ventricular size is normal. There is mildly elevated pulmonary artery systolic pressure. The estimated right ventricular systolic pressure is 99991111 mmHg.  3. Left atrial size was moderately dilated.  4. Right atrial size was moderately dilated.  5. The mitral valve is normal in structure. Trivial mitral valve regurgitation. No evidence of mitral stenosis. Moderate mitral annular calcification.  6. The aortic valve is tricuspid. There is mild calcification of the aortic valve. There is mild thickening of the aortic valve. Aortic valve regurgitation is mild. Aortic valve sclerosis is present, with no evidence of aortic valve stenosis.  7. The inferior vena cava is normal in size with greater than 50% respiratory variability, suggesting right atrial pressure of 3 mmHg. Comparison(s): No prior Echocardiogram. Conclusion(s)/Recommendation(s): Otherwise normal echocardiogram, with minor abnormalities described in the report. FINDINGS  Left Ventricle: Left ventricular ejection fraction, by estimation, is 55 to 60%. The left ventricle has normal function. The left ventricle has no regional wall motion abnormalities. The left ventricular internal cavity size was normal in size. There is  no left ventricular hypertrophy. Left ventricular diastolic function could not be evaluated due to atrial fibrillation. Left ventricular diastolic function could not be evaluated.  Right Ventricle: The right ventricular size is normal. Right vetricular wall thickness was not well visualized. Right ventricular systolic function is normal. There is mildly elevated pulmonary artery systolic pressure. The tricuspid regurgitant velocity  is 3.12 m/s, and with an assumed right atrial pressure of 3 mmHg, the estimated right ventricular systolic pressure is 99991111 mmHg. Left Atrium: Left atrial size was moderately dilated. Right Atrium: Right atrial size was moderately dilated. Pericardium: There is no evidence of pericardial effusion. Mitral Valve: The mitral valve is normal in structure. There is mild thickening of the mitral valve leaflet(s). There is mild calcification of the mitral valve leaflet(s). Moderate mitral annular calcification. Trivial mitral valve regurgitation. No evidence of mitral valve stenosis. Tricuspid Valve: The tricuspid valve is normal in structure. Tricuspid valve regurgitation is trivial. No evidence of tricuspid stenosis. Aortic Valve: The aortic valve is tricuspid. There is mild calcification of the aortic valve. There is mild thickening of the aortic valve. Aortic valve regurgitation is mild. Aortic regurgitation PHT measures 723 msec. Aortic valve sclerosis is present,  with no evidence of aortic valve stenosis. Pulmonic Valve: The pulmonic valve was not well visualized. Pulmonic valve regurgitation is mild. No evidence of pulmonic stenosis. Aorta: The aortic root, ascending aorta and aortic arch are all structurally normal, with no evidence of dilitation or obstruction. Venous: The inferior vena cava is normal in size with greater than 50% respiratory variability, suggesting right atrial pressure of 3 mmHg. IAS/Shunts: The atrial septum is grossly normal.  LEFT VENTRICLE PLAX 2D LVIDd:         4.50 cm LVIDs:         3.37 cm LV PW:         0.80 cm LV IVS:        0.80 cm LVOT diam:     1.90 cm LV SV:         44 LV SV Index:   29 LVOT Area:     2.84 cm  LV Volumes (MOD) LV  vol d, MOD A2C: 44.4 ml LV vol d, MOD A4C: 42.4 ml LV vol s, MOD A2C: 13.3 ml LV vol s, MOD A4C: 14.5 ml LV SV MOD A2C:     31.1 ml LV SV MOD A4C:     42.4 ml LV SV MOD BP:      29.3 ml RIGHT VENTRICLE RV S prime:     10.80 cm/s TAPSE (M-mode): 2.0 cm LEFT ATRIUM             Index        RIGHT ATRIUM           Index LA Vol (A2C):   47.4 ml 31.13 ml/m  RA Area:     18.10 cm LA Vol (A4C):   56.0 ml 36.77 ml/m  RA Volume:   55.90 ml  36.71 ml/m LA Biplane Vol: 53.0 ml 34.80 ml/m  AORTIC VALVE LVOT Vmax:   74.40 cm/s LVOT Vmean:  53.000 cm/s LVOT VTI:    0.154 m AI PHT:      723 msec  AORTA Ao Root diam: 2.90 cm Ao Asc diam:  3.30 cm MITRAL VALVE               TRICUSPID VALVE MV Area (PHT): 4.21 cm    TR Peak grad:   38.9 mmHg MV Decel  Time: 180 msec    TR Vmax:        312.00 cm/s MV E velocity: 83.10 cm/s MV A velocity: 34.70 cm/s  SHUNTS MV E/A ratio:  2.39        Systemic VTI:  0.15 m                            Systemic Diam: 1.90 cm Buford Dresser MD Electronically signed by Buford Dresser MD Signature Date/Time: 09/04/2022/12:27:53 PM    Final    CT Head Wo Contrast  Result Date: 09/03/2022 CLINICAL DATA:  Head trauma, moderate-severe; Neck trauma (Age >= 65y). No contrast. Pt BIB EMS from home, lives alone. Was sitting at dining room table when she had syncopal episode. Pt states this happens when in a-fib. Pt crawled to phone to call EMS. C/o lightheadedness, denies pain/injury. EXAM: CT HEAD WITHOUT CONTRAST CT CERVICAL SPINE WITHOUT CONTRAST TECHNIQUE: Multidetector CT imaging of the head and cervical spine was performed following the standard protocol without intravenous contrast. Multiplanar CT image reconstructions of the cervical spine were also generated. RADIATION DOSE REDUCTION: This exam was performed according to the departmental dose-optimization program which includes automated exposure control, adjustment of the mA and/or kV according to patient size and/or use of iterative  reconstruction technique. COMPARISON:  Chest x-ray 09/03/2022 FINDINGS: CT HEAD FINDINGS BRAIN: BRAIN Cerebral ventricle sizes are concordant with the degree of cerebral volume loss. Patchy and confluent areas of decreased attenuation are noted throughout the deep and periventricular white matter of the cerebral hemispheres bilaterally, compatible with chronic microvascular ischemic disease. No evidence of large-territorial acute infarction. No parenchymal hemorrhage. No mass lesion. No extra-axial collection. No mass effect or midline shift. No hydrocephalus. Basilar cisterns are patent. Vascular: No hyperdense vessel. Skull: No acute fracture or focal lesion. Sinuses/Orbits: Paranasal sinuses and mastoid air cells are clear. Bilateral lens replacement. Otherwise the orbits are unremarkable. Other: None. CT CERVICAL SPINE FINDINGS Alignment: Grade 1 anterolisthesis of C3 on C4. Skull base and vertebrae: Multilevel moderate severe degenerative changes spine. No associated severe osseous neural foraminal stenosis. No acute fracture. No aggressive appearing focal osseous lesion or focal pathologic process. Soft tissues and spinal canal: No prevertebral fluid or swelling. No visible canal hematoma. Upper chest: Diffuse bronchial wall thickening. Periapical pleural/pulmonary scarring. Other: Atherosclerotic plaque of the aortic arch and its branches. IMPRESSION: 1. No acute intracranial abnormality. 2. No acute displaced fracture or traumatic listhesis of the cervical spine. 3.  Aortic Atherosclerosis (ICD10-I70.0). 4. Small airway disease. Electronically Signed   By: Iven Finn M.D.   On: 09/03/2022 22:34   CT Cervical Spine Wo Contrast  Result Date: 09/03/2022 CLINICAL DATA:  Head trauma, moderate-severe; Neck trauma (Age >= 65y). No contrast. Pt BIB EMS from home, lives alone. Was sitting at dining room table when she had syncopal episode. Pt states this happens when in a-fib. Pt crawled to phone to call  EMS. C/o lightheadedness, denies pain/injury. EXAM: CT HEAD WITHOUT CONTRAST CT CERVICAL SPINE WITHOUT CONTRAST TECHNIQUE: Multidetector CT imaging of the head and cervical spine was performed following the standard protocol without intravenous contrast. Multiplanar CT image reconstructions of the cervical spine were also generated. RADIATION DOSE REDUCTION: This exam was performed according to the departmental dose-optimization program which includes automated exposure control, adjustment of the mA and/or kV according to patient size and/or use of iterative reconstruction technique. COMPARISON:  Chest x-ray 09/03/2022 FINDINGS: CT HEAD FINDINGS BRAIN: BRAIN Cerebral ventricle  sizes are concordant with the degree of cerebral volume loss. Patchy and confluent areas of decreased attenuation are noted throughout the deep and periventricular white matter of the cerebral hemispheres bilaterally, compatible with chronic microvascular ischemic disease. No evidence of large-territorial acute infarction. No parenchymal hemorrhage. No mass lesion. No extra-axial collection. No mass effect or midline shift. No hydrocephalus. Basilar cisterns are patent. Vascular: No hyperdense vessel. Skull: No acute fracture or focal lesion. Sinuses/Orbits: Paranasal sinuses and mastoid air cells are clear. Bilateral lens replacement. Otherwise the orbits are unremarkable. Other: None. CT CERVICAL SPINE FINDINGS Alignment: Grade 1 anterolisthesis of C3 on C4. Skull base and vertebrae: Multilevel moderate severe degenerative changes spine. No associated severe osseous neural foraminal stenosis. No acute fracture. No aggressive appearing focal osseous lesion or focal pathologic process. Soft tissues and spinal canal: No prevertebral fluid or swelling. No visible canal hematoma. Upper chest: Diffuse bronchial wall thickening. Periapical pleural/pulmonary scarring. Other: Atherosclerotic plaque of the aortic arch and its branches. IMPRESSION: 1.  No acute intracranial abnormality. 2. No acute displaced fracture or traumatic listhesis of the cervical spine. 3.  Aortic Atherosclerosis (ICD10-I70.0). 4. Small airway disease. Electronically Signed   By: Iven Finn M.D.   On: 09/03/2022 22:34   DG Pelvis Portable  Result Date: 09/03/2022 CLINICAL DATA:  Syncope. EXAM: PORTABLE PELVIS 1-2 VIEWS COMPARISON:  None Available. FINDINGS: There is no evidence of an acute pelvic fracture or diastasis. No pelvic bone lesions are seen. Moderate severity degenerative changes are seen involving the bilateral hips in the form of joint space narrowing and acetabular sclerosis. Postoperative and degenerative changes seen within the visualized portion of the lumbar spine. IMPRESSION: 1. 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 2. Postoperative and degenerative changes within the lumbar spine. Electronically Signed   By: Virgina Norfolk M.D.   On: 09/03/2022 22:19   DG Chest Portable 1 View  Result Date: 09/03/2022 CLINICAL DATA:  Status post fall. EXAM: PORTABLE CHEST 1 VIEW COMPARISON:  May 20, 2021 FINDINGS: The cardiac silhouette is moderately enlarged and unchanged in size. There is moderate severity calcification of the aortic arch. Diffuse, chronic appearing increased interstitial lung markings are seen. There is no evidence of acute infiltrate, pleural effusion or pneumothorax. Multilevel degenerative changes are seen throughout the thoracic spine. IMPRESSION: Stable cardiomegaly and chronic appearing increased interstitial lung markings without evidence of acute or active cardiopulmonary disease. Electronically Signed   By: Virgina Norfolk M.D.   On: 09/03/2022 22:17    Microbiology: No results found for this or any previous visit.  Labs: CBC: Recent Labs  Lab 09/03/22 2155 09/04/22 1018 09/05/22 0501 09/06/22 0347  WBC 7.0 5.9 6.9 8.5  NEUTROABS 5.4 3.9 5.1 6.1  HGB 13.1 12.4 13.0 13.6  HCT 40.6 38.3 39.3 41.2  MCV  98.8 96.2 95.9 95.4  PLT 147* 137* 131* 0000000*   Basic Metabolic Panel: Recent Labs  Lab 09/03/22 2235 09/04/22 0057 09/04/22 1018 09/05/22 0501 09/06/22 0347  NA 142  --  142 141 136  K 4.0  --  3.7 3.8 4.0  CL 109  --  107 110 103  CO2 26  --  25 25 26   GLUCOSE 106*  --  87 92 104*  BUN 35*  --  25* 18 18  CREATININE 1.18*  --  0.95 0.89 0.87  CALCIUM 9.4  --  9.3 9.2 9.3  MG  --  1.8 1.5* 1.9 1.7  PHOS  --  2.8 2.8 3.0 2.7  Liver Function Tests: Recent Labs  Lab 09/03/22 2235 09/04/22 1018 09/05/22 0501 09/06/22 0347  AST 30 28 27 28   ALT 15 15 15 14   ALKPHOS 79 62 66 62  BILITOT 0.7 1.1 0.8 1.0  PROT 6.0* 5.7* 6.0* 6.4*  ALBUMIN 3.7 3.4* 3.4* 3.5   CBG: No results for input(s): "GLUCAP" in the last 168 hours.  Discharge time spent: greater than 30 minutes.  Signed: Raiford Noble, DO [facility] 09/06/2022

## 2022-09-06 NOTE — Care Management (Signed)
    Durable Medical Equipment  (From admission, onward)           Start     Ordered   09/06/22 1152  For home use only DME Hospital bed  Once       Question Answer Comment  Length of Need 6 Months   Patient has (list medical condition): Generalized Weakness and Physical Decondition   The above medical condition requires: Patient requires the ability to reposition frequently   Bed type Semi-electric      09/06/22 1152

## 2022-09-09 ENCOUNTER — Encounter: Payer: Self-pay | Admitting: *Deleted

## 2022-09-09 ENCOUNTER — Other Ambulatory Visit: Payer: Medicare Other

## 2022-09-09 DIAGNOSIS — R55 Syncope and collapse: Secondary | ICD-10-CM

## 2022-09-09 NOTE — Progress Notes (Unsigned)
Enrolled for [employer] to mail a ZIO AT Live Telemetry monitor to patients address on file.  Patient discharged prior to monitor placement/ mail to patient.   Letter with instructions mailed to patient.  Dr. Marlou Porch to read.

## 2022-09-16 NOTE — Progress Notes (Signed)
Office Visit    Patient Name: Jessica Berg Date of Encounter: 09/16/2022  Primary Care Provider:  Lajean Manes, MD Primary Cardiologist:  Candee Furbish, MD Primary Electrophysiologist: None  Chief Complaint    Jessica Berg is a 88 y.o. female with PMH of permanent AF (on Eliquis) HTN, CKD stage IIIb, GERD, HLD, CVA, mitral regurgitation, LAFB, osteoporosis who presents today for hospital follow-up of syncope.  Past Medical History    Past Medical History:  Diagnosis Date   Atrial fibrillation (HCC)    Chronic renal disease, stage II    GERD (gastroesophageal reflux disease)    Hypercholesteremia    MR (mitral regurgitation)    , Mild   Osteoarthritis    Osteoporosis    Past Surgical History:  Procedure Laterality Date   BACK SURGERY     JOINT REPLACEMENT      Allergies  Allergies  Allergen Reactions   Darvocet [Propoxyphene N-Acetaminophen] Nausea And Vomiting   Lodine [Etodolac] Hives    Unknown to patient   Meprozine [Meperidine-Promethazine]     Reaction=tongue swelling   Percocet [Oxycodone-Acetaminophen] Nausea And Vomiting    Dizziness    Ultracet [Tramadol-Acetaminophen] Other (See Comments)    headache   Demerol Hives   Hydrocodone Itching   Penicillins Rash    History of Present Illness    Jessica Berg  is a 88 year old female with the above mention past medical history who presents today for follow-up of recent syncope episode.  Jessica Berg was originally seen by Dr. Marlou Porch and 02/2014 for management of atrial fibrillation.  She has been evaluated by EP most recently 2020 for syncope and atrial fibrillation by Dr. Curt Bears.  Patient has worn Holter monitors in the past and event monitors that were both unremarkable for episodes.  Patient was able to capture atrial fibrillation on her cardia device.  She was last seen by Dr. Marlou Porch on 02/2022 for follow-up of hypertension and AF.  Prior to her appointment she had complaints of  lightheadedness and fatigue, noted to fall asleep during conversations.  During her visit patient was doing well with no recurrent syncopal episodes.  She had no med changes made and was continued on current dose of metoprolol and Eliquis.  Jessica Berg was admitted through the ED on 09/03/2022 due to mechanical fall.  Patient fell from a barstool and was not aware if she struck her head.  There was no prodrome symptoms.  She was cleared through CT for head trauma and patient was admitted to medicine team for further evaluation.  She was consulted by Dr. Sallyanne Kuster and 2D echo was completed with EF of 55-60%, no RWMA, normal LV/RV function, moderate dilated RA/LA with trivial MR, mild aortic insufficiency.  EKG completed with no acute changes and atrial fibrillation noted with anterior Q waves.  During interview patient endorses rushing sensation in her head but states was different from previous episodes.  It appears patient may have not had syncopal event but fell from barstool.  Telemetry during 24-hour period revealed no pauses or bradycardia with atrial fibrillation.  She was recommended to wear event monitor for further evaluation for possible evidence of AV block.  Jessica Berg presents today with her daughter for follow-up of recent hospitalization of presyncope.  Since last being seen in the office patient reports that she has been doing well with no presyncope or lightheadedness.  She is tolerating her medications currently with no adverse reactions.  Her blood pressure today  is well controlled at 134/70 and heart rate is 57.  She was given event monitor in hospital to wear however patient politely declines to wear event monitor at this time.  She notes some blurriness when rising from sitting to standing however feels this is related to her glaucoma and right eye.  We discussed the importance of changing positions slowly and allowing time to pass before taking steps from sitting to standing.  Patient  denies chest pain, palpitations, dyspnea, PND, orthopnea, nausea, vomiting, dizziness, syncope, edema, weight gain, or early satiety.    Current Outpatient Medications  Medication Sig Dispense Refill   apixaban (ELIQUIS) 2.5 MG TABS tablet Take 1 tablet (2.5 mg total) by mouth 2 (two) times daily. 180 tablet 1   brimonidine (ALPHAGAN) 0.2 % ophthalmic solution Place 1 drop into the right eye 2 (two) times daily. (Patient not taking: Reported on 09/04/2022)     Calcium Carbonate-Vitamin D3 600-400 MG-UNIT TABS Take 1 tablet by mouth daily.     carboxymethylcellulose (REFRESH PLUS) 0.5 % SOLN Place 1 drop into both eyes See admin instructions. Instill 1 drop into both eyes every hour     celecoxib (CELEBREX) 200 MG capsule Take 200 mg by mouth daily.       cholecalciferol (VITAMIN D) 1000 UNITS tablet Take 1,000 Units by mouth daily.       dorzolamide-timolol (COSOPT) 22.3-6.8 MG/ML ophthalmic solution Place 1 drop into the right eye 2 (two) times daily.     metoprolol succinate (TOPROL-XL) 25 MG 24 hr tablet TAKE ONE AND ONE-HALF TABLETS DAILY (Patient taking differently: Take 37.5 mg by mouth daily.) 135 tablet 2   vitamin C (ASCORBIC ACID) 500 MG tablet Take 500 mg by mouth daily.       No current facility-administered medications for this visit.     Review of Systems  Please see the history of present illness.     All other systems reviewed and are otherwise negative except as noted above.  Physical Exam    Wt Readings from Last 3 Encounters:  09/03/22 123 lb 14.4 oz (56.2 kg)  03/14/22 124 lb (56.2 kg)  09/18/21 126 lb 9.6 oz (57.4 kg)   MW:NUUVO were no vitals filed for this visit.,There is no height or weight on file to calculate BMI.  Constitutional:      Appearance: Healthy appearance. Not in distress.  Neck:     Vascular: JVD normal.  Pulmonary:     Effort: Pulmonary effort is normal.     Breath sounds: No wheezing. No rales. Diminished in the bases Cardiovascular:      Irregularly irregular. Normal S1. Normal S2.      Murmurs: There is no murmur.  Edema:    Peripheral edema absent.  Abdominal:     Palpations: Abdomen is soft non tender. There is no hepatomegaly.  Skin:    General: Skin is warm and dry.  Neurological:     General: No focal deficit present.     Mental Status: Alert and oriented to person, place and time.     Cranial Nerves: Cranial nerves are intact.  EKG/LABS/Other Studies Reviewed    ECG personally reviewed by me today -none completed today  Risk Assessment/Calculations:    CHA2DS2-VASc Score = 6   This indicates a 9.7% annual risk of stroke. The patient's score is based upon: CHF History: 0 HTN History: 1 Diabetes History: 0 Stroke History: 2 Vascular Disease History: 0 Age Score: 2 Gender Score: 1  Lab Results  Component Value Date   WBC 8.5 09/06/2022   HGB 13.6 09/06/2022   HCT 41.2 09/06/2022   MCV 95.4 09/06/2022   PLT 137 (L) 09/06/2022   Lab Results  Component Value Date   CREATININE 0.87 09/06/2022   BUN 18 09/06/2022   NA 136 09/06/2022   K 4.0 09/06/2022   CL 103 09/06/2022   CO2 26 09/06/2022   Lab Results  Component Value Date   ALT 14 09/06/2022   AST 28 09/06/2022   ALKPHOS 62 09/06/2022   BILITOT 1.0 09/06/2022   No results found for: "CHOL", "HDL", "LDLCALC", "LDLDIRECT", "TRIG", "CHOLHDL"  No results found for: "HGBA1C"  Assessment & Plan    1.  Syncope: -Recent admission for possible syncopal event without loss of consciousness. -Patient has history of lightheadedness and weakness and ZIO AT monitor was placed following discharge. -Patient politely declines wearing event monitor at this time -Today patient reports no presyncope or lightheadedness -Hydration and compression stockings were discussed  2.  Permanent atrial fibrillation: -Patient currently rate controlled with Toprol 37.5 mg daily -She is currently on dose reduced Eliquis at 2.5 mg twice daily due to age and  weight.  3.  Essential hypertension: Patient's blood pressure today was well controlled at 134/72 -Continue Toprol 37.5 mg daily  4.  History of mitral regurgitation: -Recent 2D echo completed noting EF of 55-60%, with trivial MV regurgitation and no evidence of mitral stenosis -Continue blood pressure medication and heart rate control with Toprol-XL  Disposition: Follow-up with Candee Furbish, MD or APP in 6 months    Medication Adjustments/Labs and Tests Ordered: Current medicines are reviewed at length with the patient today.  Concerns regarding medicines are outlined above.   Signed, Mable Fill, Marissa Nestle, NP 09/16/2022, 3:03 PM [facility]  Note:  This document was prepared using Dragon voice recognition software and may include unintentional dictation errors.

## 2022-09-17 ENCOUNTER — Encounter: Payer: Self-pay | Admitting: *Deleted

## 2022-09-17 ENCOUNTER — Encounter: Payer: Self-pay | Admitting: Nurse Practitioner

## 2022-09-17 ENCOUNTER — Ambulatory Visit: Payer: Medicare Other | Attending: Nurse Practitioner | Admitting: Nurse Practitioner

## 2022-09-17 VITALS — BP 134/72 | HR 59 | Ht 60.0 in | Wt 124.0 lb

## 2022-09-17 DIAGNOSIS — I10 Essential (primary) hypertension: Secondary | ICD-10-CM | POA: Diagnosis not present

## 2022-09-17 DIAGNOSIS — R55 Syncope and collapse: Secondary | ICD-10-CM

## 2022-09-17 DIAGNOSIS — I4821 Permanent atrial fibrillation: Secondary | ICD-10-CM | POA: Diagnosis not present

## 2022-09-17 DIAGNOSIS — I340 Nonrheumatic mitral (valve) insufficiency: Secondary | ICD-10-CM | POA: Diagnosis not present

## 2022-09-17 MED ORDER — MAGNESIUM CITRATE 200 MG PO TABS: 2.0000 | ORAL_TABLET | Freq: Every day | ORAL | 11 refills | Status: AC

## 2022-09-17 NOTE — Patient Instructions (Signed)
Medication Instructions:   Your physician recommends that you continue on your current medications as directed. Please refer to the Current Medication list given to you today.   *If you need a refill on your cardiac medications before your next appointment, please call your pharmacy*   Lab Work:  None ordered.  If you have labs (blood work) drawn today and your tests are completely normal, you will receive your results only by: MyChart Message (if you have MyChart) OR A paper copy in the mail If you have any lab test that is abnormal or we need to change your treatment, we will call you to review the results.   Testing/Procedures:  None ordered   Follow-Up: At  [facility], you and your health needs are our priority.  As part of our continuing mission to provide you with exceptional heart care, we have created designated Provider Care Teams.  These Care Teams include your primary Cardiologist (physician) and Advanced Practice Providers (APPs -  Physician Assistants and Nurse Practitioners) who all work together to provide you with the care you need, when you need it.  We recommend signing up for the patient portal called "MyChart".  Sign up information is provided on this After Visit Summary.  MyChart is used to connect with patients for Virtual Visits (Telemedicine).  Patients are able to view lab/test results, encounter notes, upcoming appointments, etc.  Non-urgent messages can be sent to your provider as well.   To learn more about what you can do with MyChart, go to [email or web address].    Your next appointment:   6 month(s)  The format for your next appointment:   In Person  Provider:   Mark Skains, MD     Other Instructions  Your physician wants you to follow-up in: 6 months with Dr. Skains.  You will receive a reminder letter in the mail two months in advance. If you don't receive a letter, please call our office to schedule the follow-up  appointment.   Important Information About Sugar       

## 2022-09-17 NOTE — Progress Notes (Signed)
Patient ID: Jessica Berg, female   DOB: 11/23/1924, 88 y.o.   MRN: [id number] Patient brought back ZIO AT monitor mailed to her on 09/09/22.  Patient declined to wear monitor.  Order cancelled.

## 2022-10-02 IMAGING — DX DG KNEE COMPLETE 4+V*L*
4 series · 4 of 4 positions shown · non-contrast
Comparison: None

CLINICAL DATA: Fall onto LEFT side, abrasions LEFT knee

EXAM:
LEFT KNEE - COMPLETE 4+ VIEW

[knee ap]
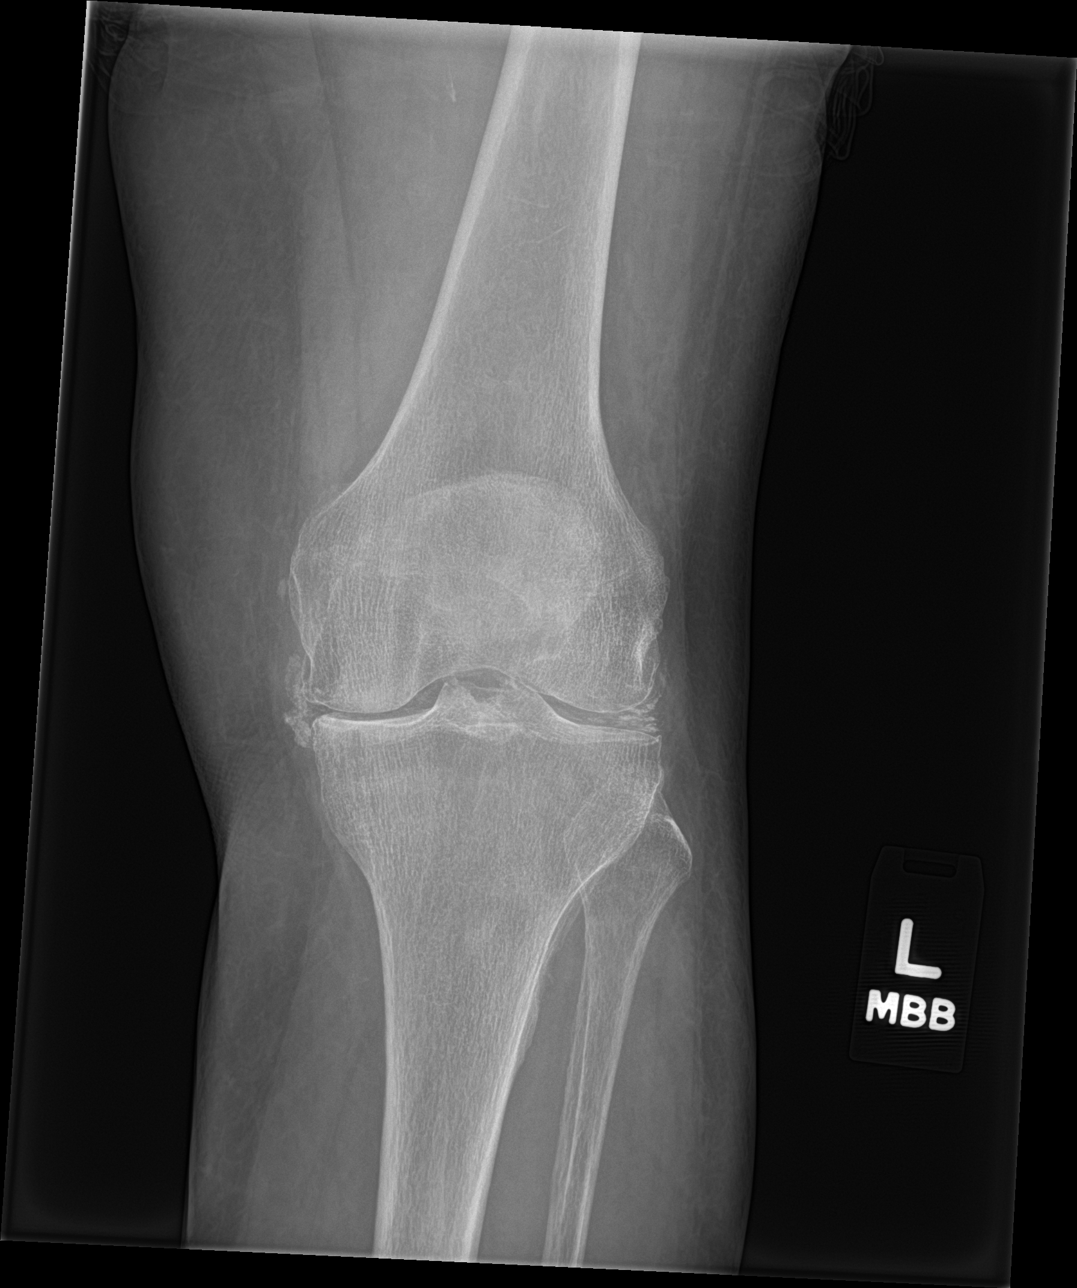

[knee lat]
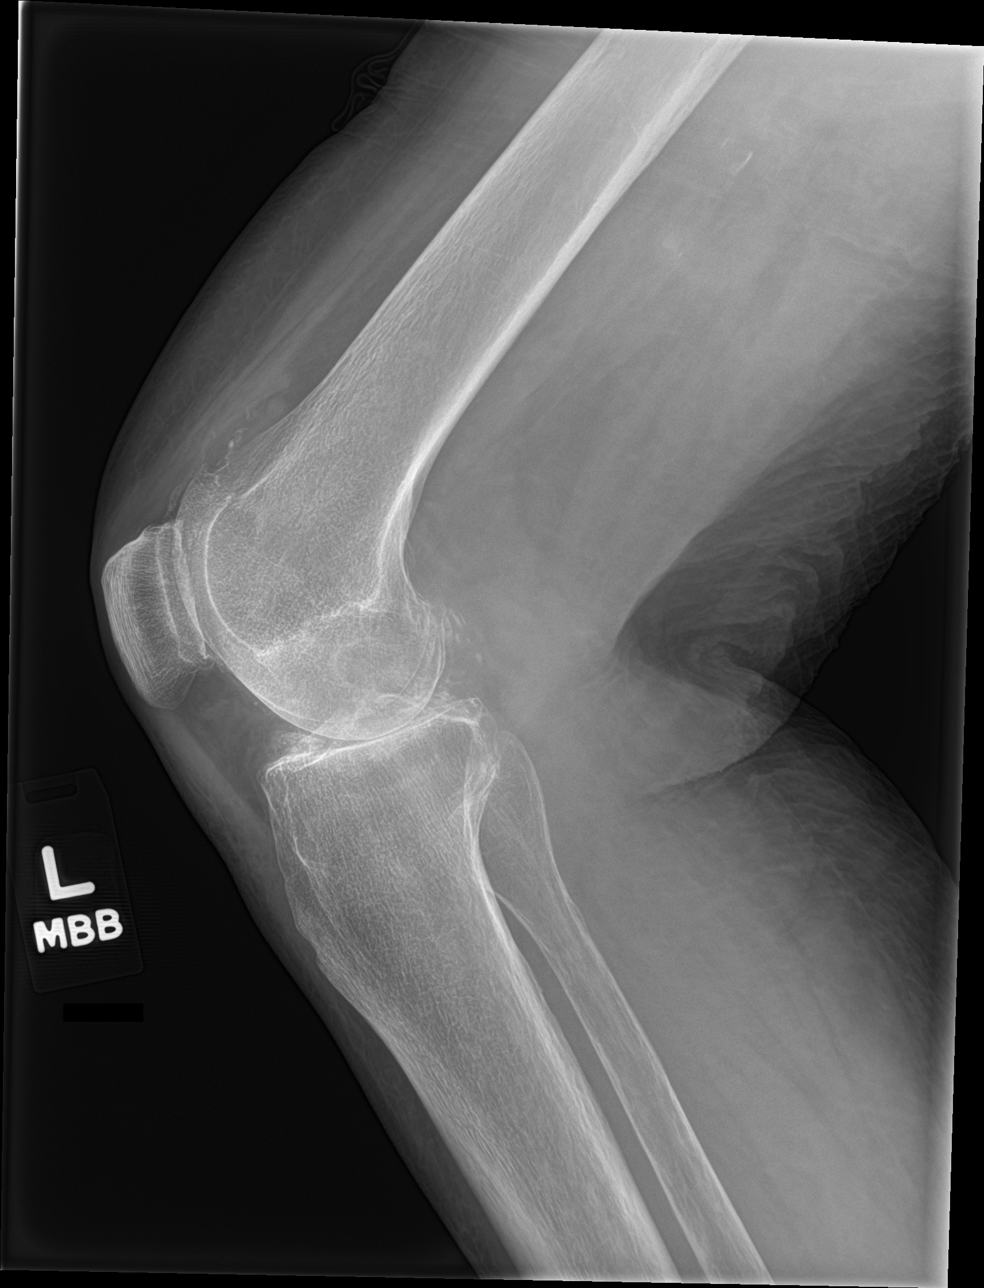

[knee obl (1 of 2)]
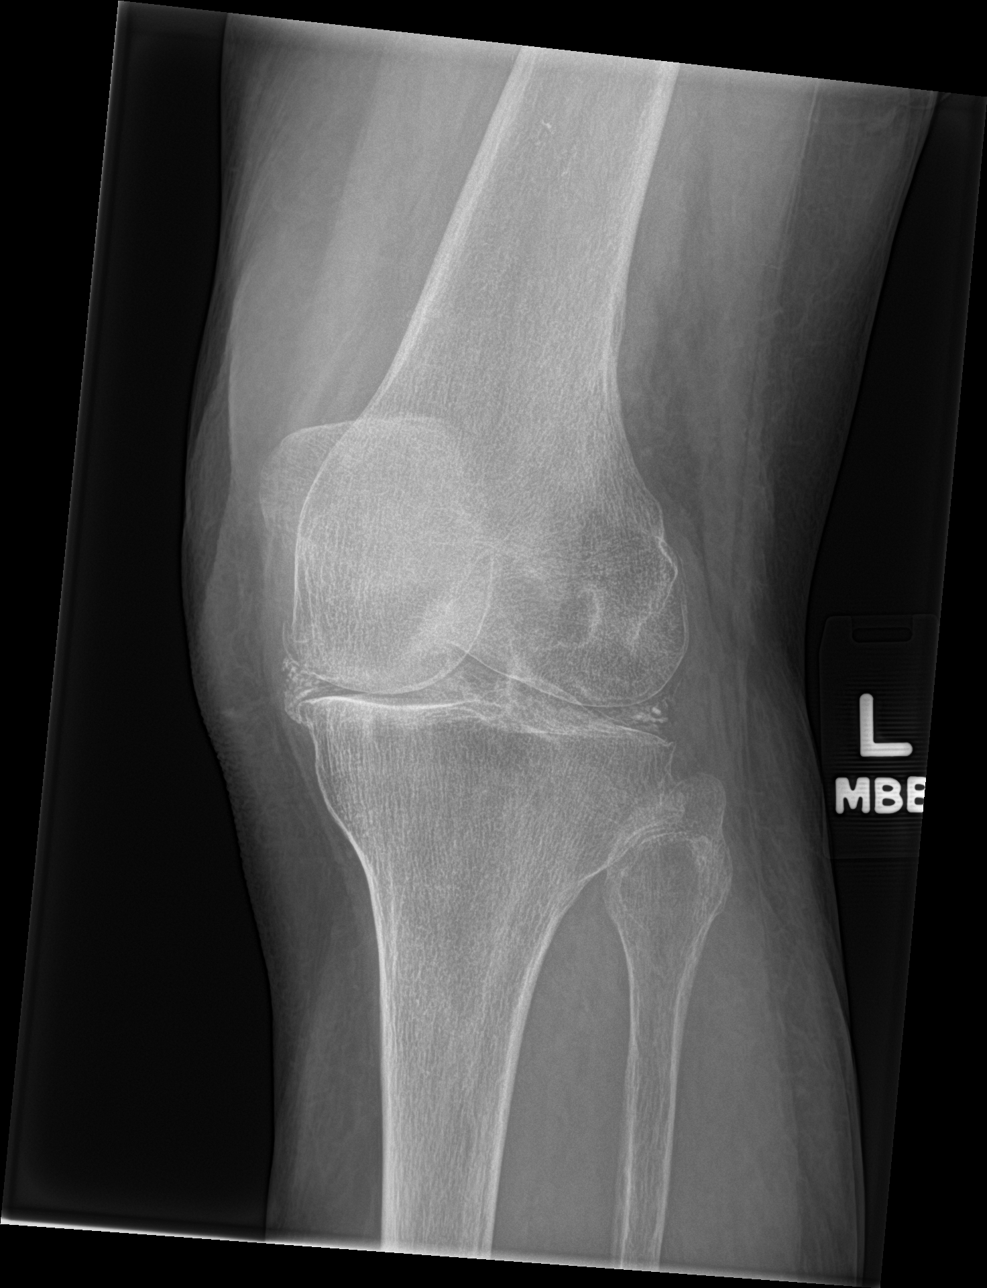

[knee obl (2 of 2)]
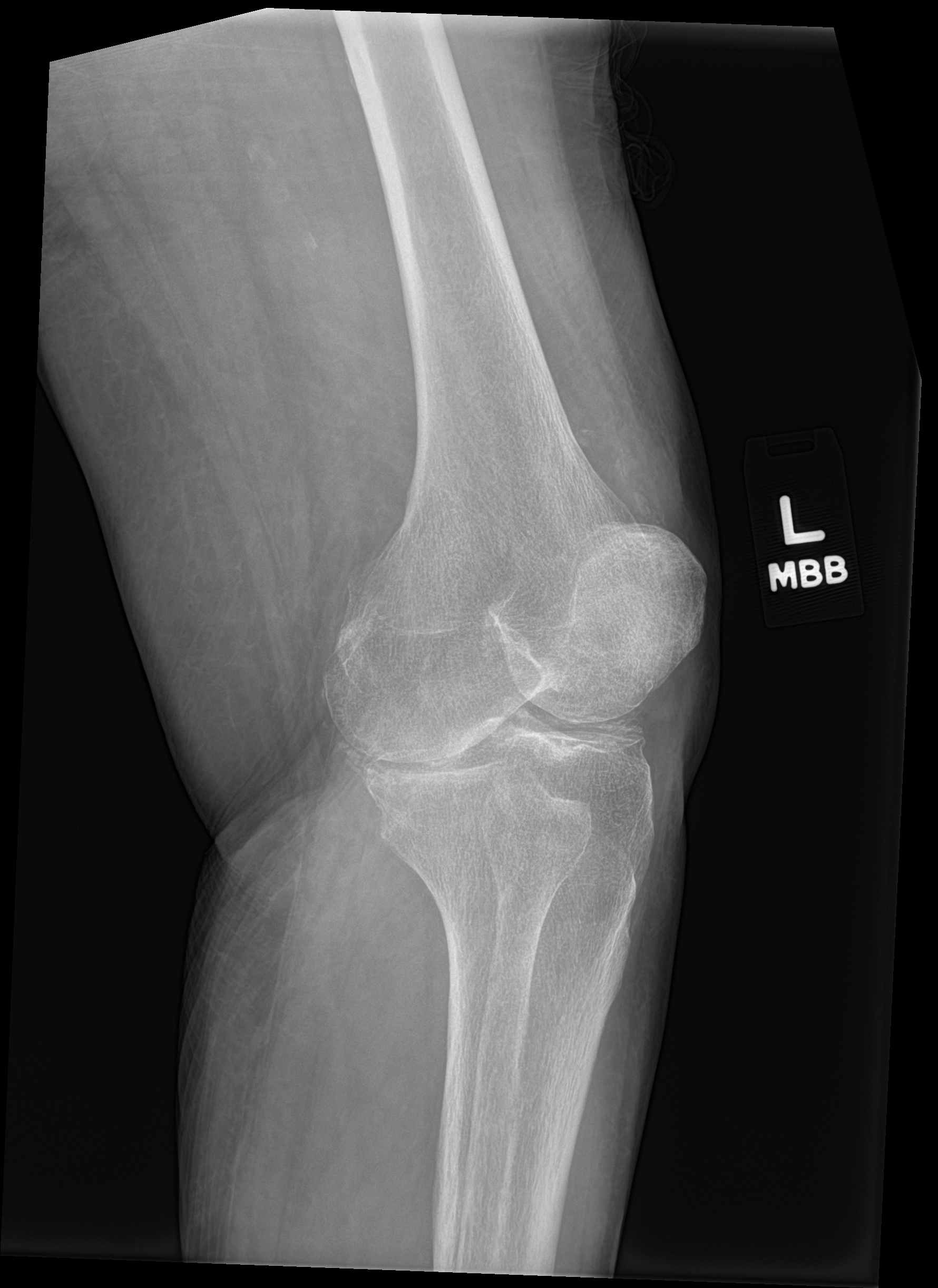

[4 of 4 positions shown; findings below may reference images not displayed]

FINDINGS: Osseous demineralization.

Diffuse joint space narrowing and minimal spur formation.

Extensive chondrocalcinosis at question CPPD.

No acute fracture, dislocation, or bone destruction.

Minimal joint effusion.
IMPRESSION: Degenerative changes and suspected CPPD LEFT knee.

No acute osseous abnormalities.

## 2022-10-02 IMAGING — CT CT HEAD W/O CM
4 series · 16 of 47 positions shown, 18 images · non-contrast
Comparison: None

CLINICAL DATA: Head and neck trauma, fell, frontal laceration, on
blood thinners, normal mental status

EXAM:
CT HEAD WITHOUT CONTRAST
CT CERVICAL SPINE WITHOUT CONTRAST
TECHNIQUE: Multidetector CT imaging of the head and cervical spine was
performed following the standard protocol without intravenous
contrast. Multiplanar CT image reconstructions of the cervical spine
were also generated.

[Series 3: head without · axial · non-contrast · 0.39mm/px · z∈[-114,+10]mm · 7 of 35 slices shown, 9 images]
[im 5/35  brain]
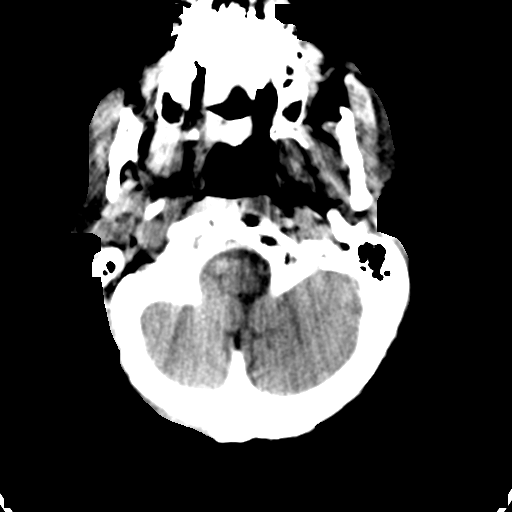
[im 5/35  bone]
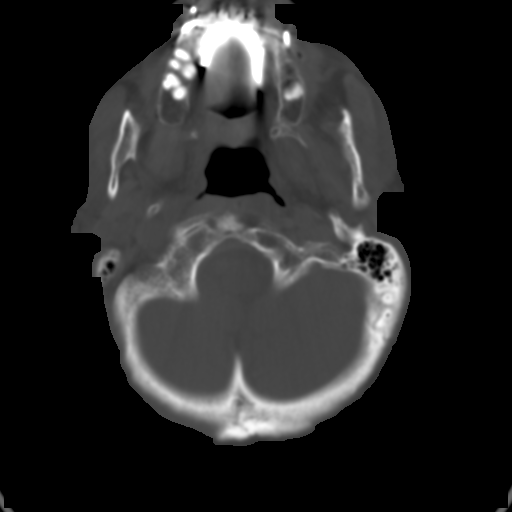
[im 9/35  brain]
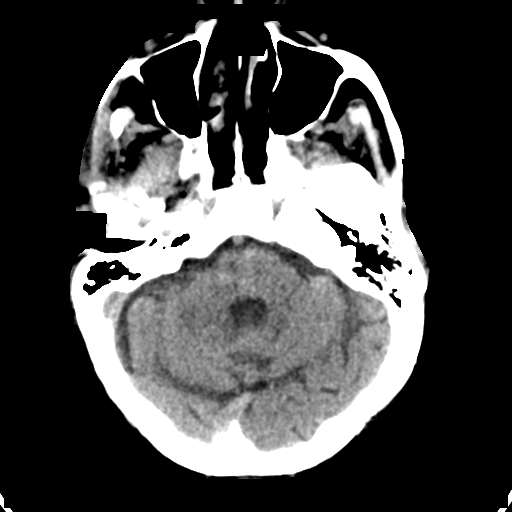
[im 13/35  brain]
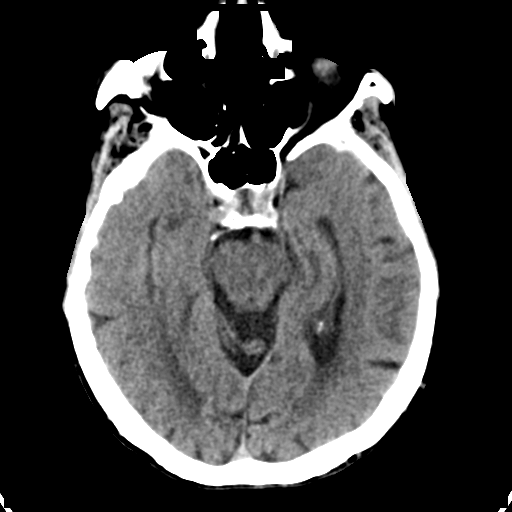
[im 18/35  brain]
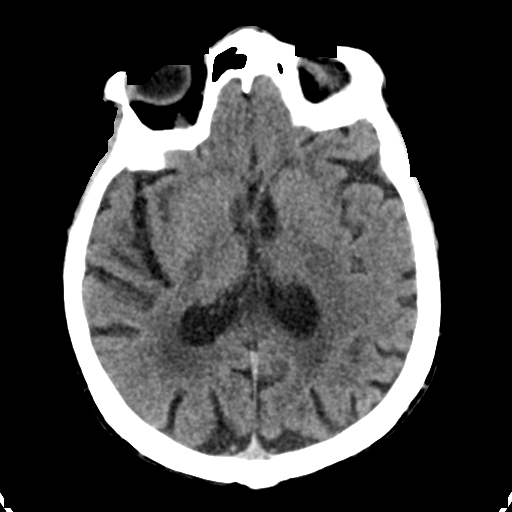
[im 22/35  brain]
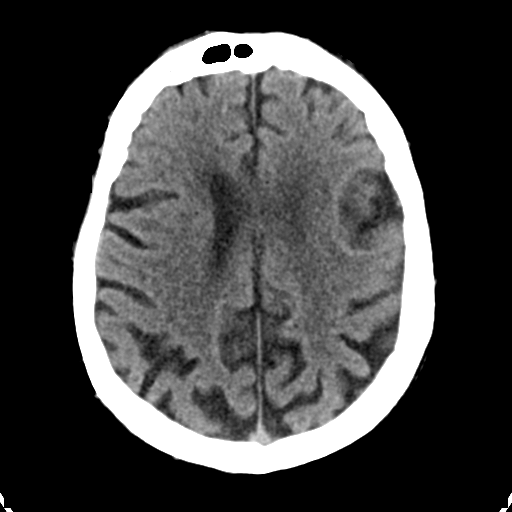
[im 22/35  bone]
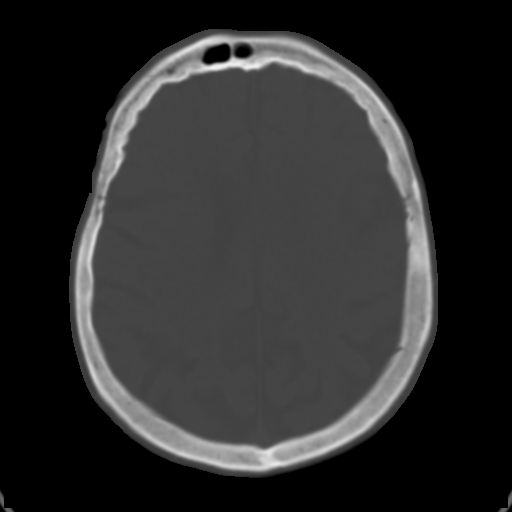
[im 26/35  brain]
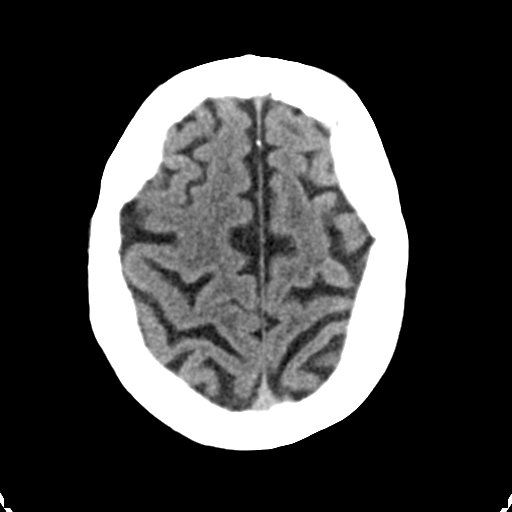
[im 30/35  brain]
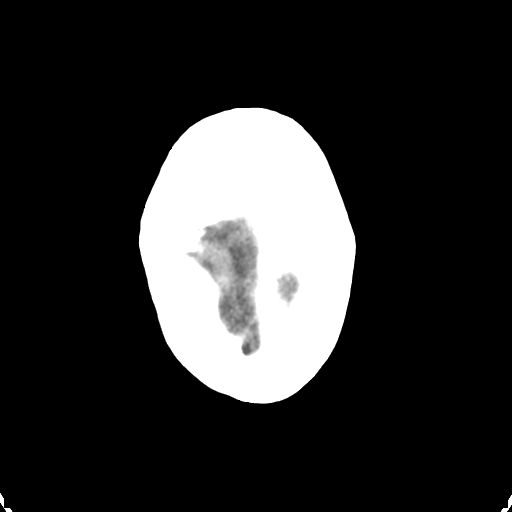

[Series 4: head bone · axial · 0.39mm/px · z∈[-118,-84]mm · 3 of 86 slices shown]
[im 9/86  bone]
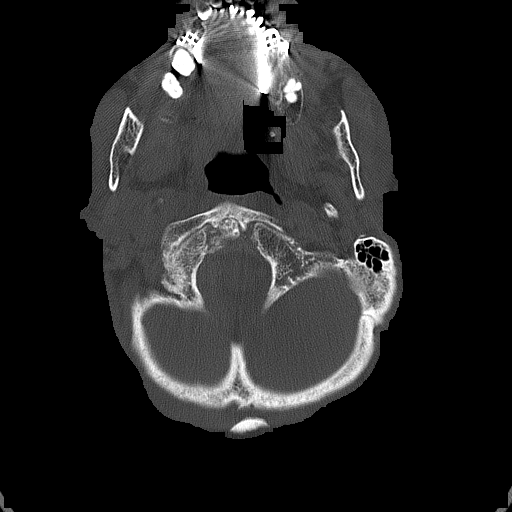
[im 18/86  bone]
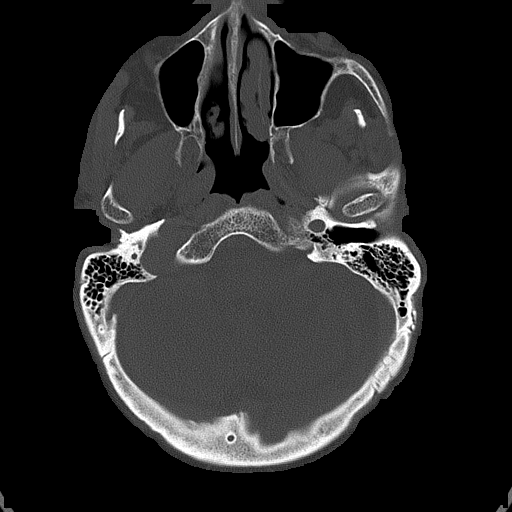
[im 26/86  bone]
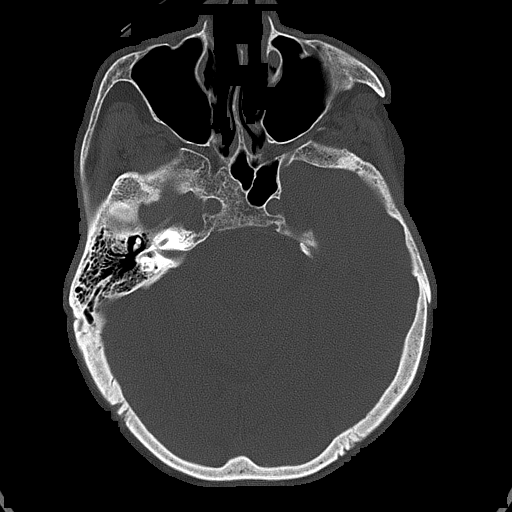

[Series 5: head without cor · coronal · non-contrast · 0.33mm/px · 3 of 72 slices shown]
[im 28/72  brain]
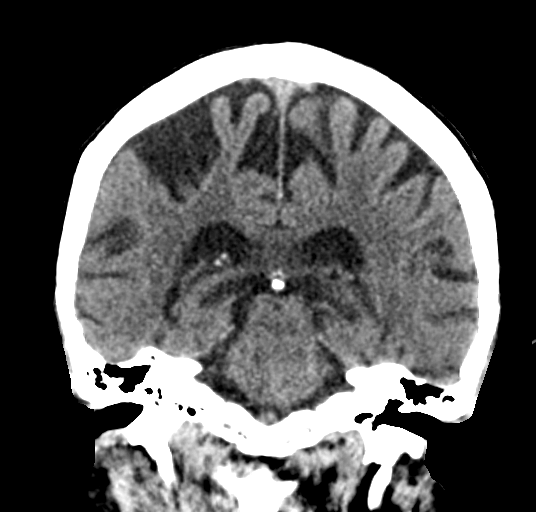
[im 33/72  brain]
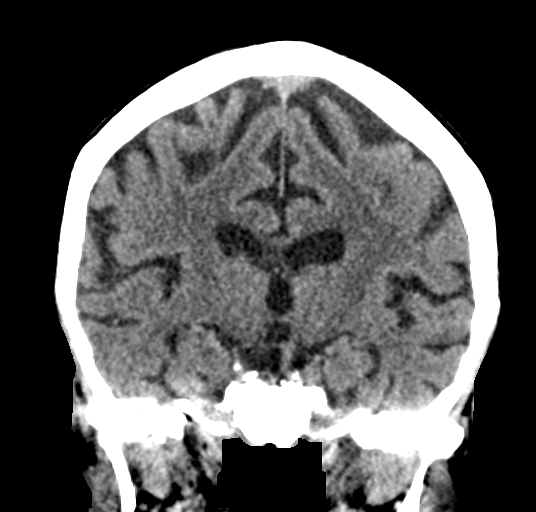
[im 39/72  brain]
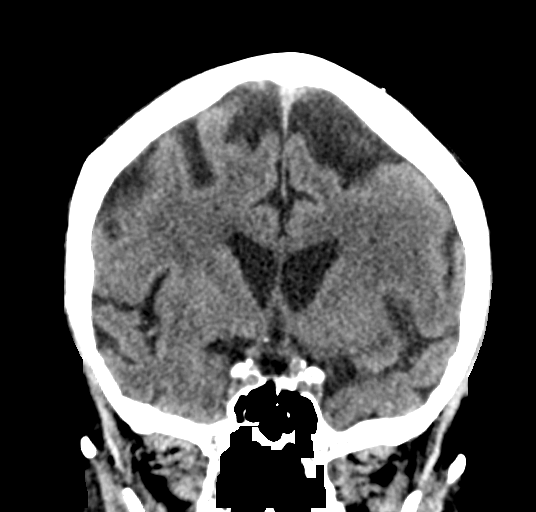

[Series 6: head without sag · sagittal · non-contrast · 0.33mm/px · 3 of 63 slices shown]
[im 21/63  brain]
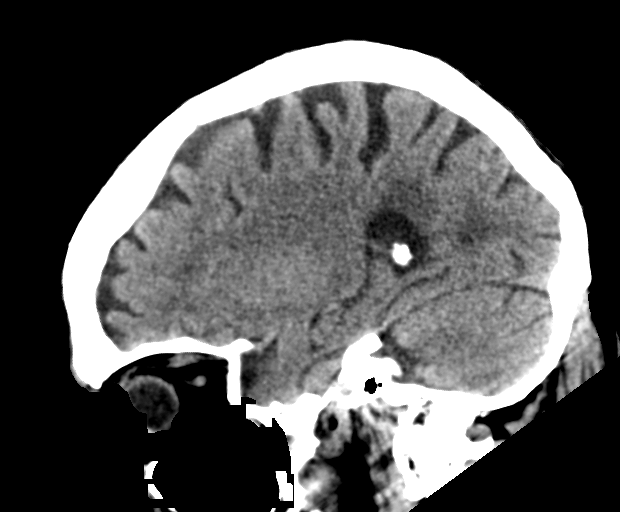
[im 32/63  brain]
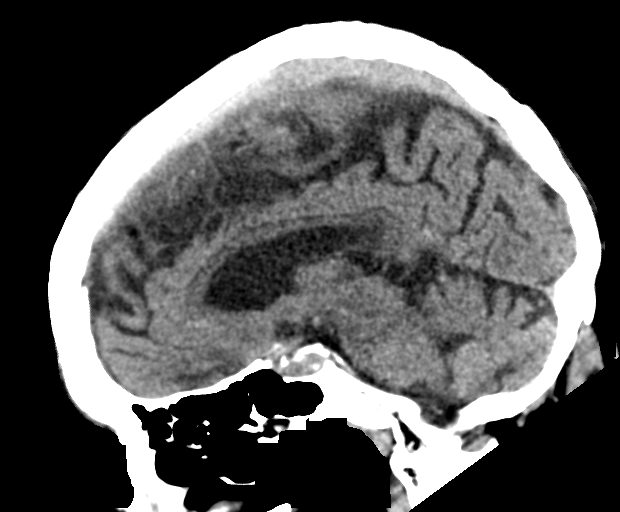
[im 42/63  brain]
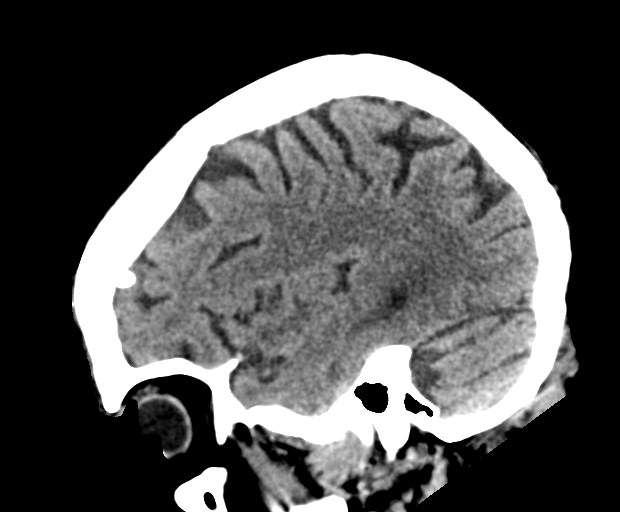

[16 of 47 positions shown; findings below may reference images not displayed]

FINDINGS: CT HEAD FINDINGS

Brain: Generalized atrophy. Normal ventricular morphology. No
midline shift or mass effect. Small vessel chronic ischemic changes
of deep cerebral white matter. No intracranial hemorrhage, mass
lesion, evidence of acute infarction, or extra-axial fluid
collection.

Vascular: No hyperdense vessels

Skull: Demineralized but intact

Sinuses/Orbits: Clear

Other: N/A

CT CERVICAL SPINE FINDINGS

Alignment: Minimal anterolisthesis C4-C5 and C7-T1. Remaining
alignments normal.

Skull base and vertebrae: Osseous demineralization. Degenerative
changes at articulation between odontoid process and anterior arch
of C1. Multilevel facet degenerative changes. Multilevel disc space
narrowing and endplate spur formation greatest at C5-C6 and C6-C7.
Vertebral body heights maintained. No fracture, additional
subluxation, or bone destruction.

Soft tissues and spinal canal: Prevertebral soft tissues normal
thickness. Atherosclerotic calcifications at proximal great vessels.

Disc levels:  No specific abnormalities.

Upper chest: Lung apices clear

Other: N/A
IMPRESSION: Atrophy with small vessel chronic ischemic changes of deep cerebral
white matter.

No acute intracranial abnormalities.

Multilevel degenerative disc and facet disease changes of the
cervical spine.

No acute cervical spine abnormalities.

## 2023-02-20 ENCOUNTER — Other Ambulatory Visit: Payer: Self-pay | Admitting: Cardiology

## 2023-03-17 ENCOUNTER — Ambulatory Visit: Payer: Medicare Other | Attending: Cardiology | Admitting: Cardiology

## 2023-03-25 LAB — LAB REPORT - SCANNED: EGFR: 47

## 2023-04-30 NOTE — Progress Notes (Deleted)
Office Visit    Patient Name: Jessica Berg Date of Encounter: 04/30/2023  Primary Care Provider:  Merlene Laughter, MD Primary Cardiologist:  Donato Schultz, MD Primary Electrophysiologist: None   Past Medical History    Past Medical History:  Diagnosis Date   Atrial fibrillation (HCC)    Chronic renal disease, stage II    GERD (gastroesophageal reflux disease)    Hypercholesteremia    MR (mitral regurgitation)    , Mild   Osteoarthritis    Osteoporosis    Past Surgical History:  Procedure Laterality Date   BACK SURGERY     JOINT REPLACEMENT      Allergies  Allergies  Allergen Reactions   Darvocet [Propoxyphene N-Acetaminophen] Nausea And Vomiting   Lodine [Etodolac] Hives    Unknown to patient   Meprozine [Meperidine-Promethazine]     Reaction=tongue swelling   Percocet [Oxycodone-Acetaminophen] Nausea And Vomiting    Dizziness    Ultracet [Tramadol-Acetaminophen] Other (See Comments)    headache   Demerol Hives   Hydrocodone Itching   Penicillins Rash     History of Present Illness    SILBIA BLACKFORD  is a *** year old ***with a PMH of      Since last being seen in the office patient reports***.  Patient denies chest pain, palpitations, dyspnea, PND, orthopnea, nausea, vomiting, dizziness, syncope, edema, weight gain, or early satiety.     ***Notes:  Home Medications    Current Outpatient Medications  Medication Sig Dispense Refill   apixaban (ELIQUIS) 2.5 MG TABS tablet Take 1 tablet (2.5 mg total) by mouth 2 (two) times daily. 180 tablet 1   brimonidine (ALPHAGAN) 0.2 % ophthalmic solution Place 1 drop into the right eye 2 (two) times daily.     Calcium Carbonate-Vitamin D3 600-400 MG-UNIT TABS Take 1 tablet by mouth daily.     carboxymethylcellulose (REFRESH PLUS) 0.5 % SOLN Place 1 drop into both eyes See admin instructions. Instill 1 drop into both eyes every hour     celecoxib (CELEBREX) 200 MG capsule Take 200 mg by mouth daily.        cholecalciferol (VITAMIN D) 1000 UNITS tablet Take 1,000 Units by mouth daily.       dorzolamide-timolol (COSOPT) 22.3-6.8 MG/ML ophthalmic solution Place 1 drop into the right eye 2 (two) times daily.     Magnesium Citrate 200 MG TABS Take 400 mg by mouth daily at 6 (six) AM. 60 tablet 11   metoprolol succinate (TOPROL-XL) 25 MG 24 hr tablet TAKE ONE AND ONE-HALF TABLETS DAILY 135 tablet 3   vitamin C (ASCORBIC ACID) 500 MG tablet Take 500 mg by mouth daily.       No current facility-administered medications for this visit.     Review of Systems  Please see the history of present illness.    (+)*** (+)***  All other systems reviewed and are otherwise negative except as noted above.  Physical Exam    Wt Readings from Last 3 Encounters:  09/17/22 124 lb (56.2 kg)  09/03/22 123 lb 14.4 oz (56.2 kg)  03/14/22 124 lb (56.2 kg)   WG:NFAOZ were no vitals filed for this visit.,There is no height or weight on file to calculate BMI.  Constitutional:      Appearance: Healthy appearance. Not in distress.  Neck:     Vascular: JVD normal.  Pulmonary:     Effort: Pulmonary effort is normal.     Breath sounds: No wheezing. No rales. Diminished  in the bases Cardiovascular:     Normal rate. Regular rhythm. Normal S1. Normal S2.      Murmurs: There is no murmur.  Edema:    Peripheral edema absent.  Abdominal:     Palpations: Abdomen is soft non tender. There is no hepatomegaly.  Skin:    General: Skin is warm and dry.  Neurological:     General: No focal deficit present.     Mental Status: Alert and oriented to person, place and time.     Cranial Nerves: Cranial nerves are intact.  EKG/LABS/ Recent Cardiac Studies    ECG personally reviewed by me today - ***  Cardiac Studies & Procedures       ECHOCARDIOGRAM  ECHOCARDIOGRAM COMPLETE 09/04/2022  Narrative ECHOCARDIOGRAM REPORT    Patient Name:   Jessica Berg Date of Exam: 09/04/2022 Medical Rec #:  [id number]          Height:       60.0 in Accession #:    [id number]        Weight:       123.9 lb Date of Birth:  27-Feb-1924         BSA:          1.523 m Patient Age:    89 years          BP:           112/55 mmHg Patient Gender: F                 HR:           89 bpm. Exam Location:  Inpatient  Procedure: 2D Echo, Cardiac Doppler and Color Doppler  Indications:    Syncope  History:        Patient has no prior history of Echocardiogram examinations. Arrythmias:Atrial Fibrillation; Risk Factors:Hypertension.  Sonographer:    Lucendia Herrlich Sonographer#2:  Sheralyn Boatman RDCS Referring Phys: Consepcion Hearing ANASTASSIA DOUTOVA  IMPRESSIONS   1. Left ventricular ejection fraction, by estimation, is 55 to 60%. The left ventricle has normal function. The left ventricle has no regional wall motion abnormalities. Left ventricular diastolic function could not be evaluated. 2. Right ventricular systolic function is normal. The right ventricular size is normal. There is mildly elevated pulmonary artery systolic pressure. The estimated right ventricular systolic pressure is 41.9 mmHg. 3. Left atrial size was moderately dilated. 4. Right atrial size was moderately dilated. 5. The mitral valve is normal in structure. Trivial mitral valve regurgitation. No evidence of mitral stenosis. Moderate mitral annular calcification. 6. The aortic valve is tricuspid. There is mild calcification of the aortic valve. There is mild thickening of the aortic valve. Aortic valve regurgitation is mild. Aortic valve sclerosis is present, with no evidence of aortic valve stenosis. 7. The inferior vena cava is normal in size with greater than 50% respiratory variability, suggesting right atrial pressure of 3 mmHg.  Comparison(s): No prior Echocardiogram.  Conclusion(s)/Recommendation(s): Otherwise normal echocardiogram, with minor abnormalities described in the report.  FINDINGS Left Ventricle: Left ventricular ejection fraction, by estimation, is  55 to 60%. The left ventricle has normal function. The left ventricle has no regional wall motion abnormalities. The left ventricular internal cavity size was normal in size. There is no left ventricular hypertrophy. Left ventricular diastolic function could not be evaluated due to atrial fibrillation. Left ventricular diastolic function could not be evaluated.  Right Ventricle: The right ventricular size is normal. Right vetricular wall thickness was not well visualized. Right ventricular  systolic function is normal. There is mildly elevated pulmonary artery systolic pressure. The tricuspid regurgitant velocity is 3.12 m/s, and with an assumed right atrial pressure of 3 mmHg, the estimated right ventricular systolic pressure is 41.9 mmHg.  Left Atrium: Left atrial size was moderately dilated.  Right Atrium: Right atrial size was moderately dilated.  Pericardium: There is no evidence of pericardial effusion.  Mitral Valve: The mitral valve is normal in structure. There is mild thickening of the mitral valve leaflet(s). There is mild calcification of the mitral valve leaflet(s). Moderate mitral annular calcification. Trivial mitral valve regurgitation. No evidence of mitral valve stenosis.  Tricuspid Valve: The tricuspid valve is normal in structure. Tricuspid valve regurgitation is trivial. No evidence of tricuspid stenosis.  Aortic Valve: The aortic valve is tricuspid. There is mild calcification of the aortic valve. There is mild thickening of the aortic valve. Aortic valve regurgitation is mild. Aortic regurgitation PHT measures 723 msec. Aortic valve sclerosis is present, with no evidence of aortic valve stenosis.  Pulmonic Valve: The pulmonic valve was not well visualized. Pulmonic valve regurgitation is mild. No evidence of pulmonic stenosis.  Aorta: The aortic root, ascending aorta and aortic arch are all structurally normal, with no evidence of dilitation or obstruction.  Venous: The  inferior vena cava is normal in size with greater than 50% respiratory variability, suggesting right atrial pressure of 3 mmHg.  IAS/Shunts: The atrial septum is grossly normal.   LEFT VENTRICLE PLAX 2D LVIDd:         4.50 cm LVIDs:         3.37 cm LV PW:         0.80 cm LV IVS:        0.80 cm LVOT diam:     1.90 cm LV SV:         44 LV SV Index:   29 LVOT Area:     2.84 cm  LV Volumes (MOD) LV vol d, MOD A2C: 44.4 ml LV vol d, MOD A4C: 42.4 ml LV vol s, MOD A2C: 13.3 ml LV vol s, MOD A4C: 14.5 ml LV SV MOD A2C:     31.1 ml LV SV MOD A4C:     42.4 ml LV SV MOD BP:      29.3 ml  RIGHT VENTRICLE RV S prime:     10.80 cm/s TAPSE (M-mode): 2.0 cm  LEFT ATRIUM             Index        RIGHT ATRIUM           Index LA Vol (A2C):   47.4 ml 31.13 ml/m  RA Area:     18.10 cm LA Vol (A4C):   56.0 ml 36.77 ml/m  RA Volume:   55.90 ml  36.71 ml/m LA Biplane Vol: 53.0 ml 34.80 ml/m AORTIC VALVE LVOT Vmax:   74.40 cm/s LVOT Vmean:  53.000 cm/s LVOT VTI:    0.154 m AI PHT:      723 msec  AORTA Ao Root diam: 2.90 cm Ao Asc diam:  3.30 cm  MITRAL VALVE               TRICUSPID VALVE MV Area (PHT): 4.21 cm    TR Peak grad:   38.9 mmHg MV Decel Time: 180 msec    TR Vmax:        312.00 cm/s MV E velocity: 83.10 cm/s MV A velocity: 34.70 cm/s  SHUNTS MV E/A ratio:  2.39        Systemic VTI:  0.15 m Systemic Diam: 1.90 cm  Jodelle Red MD Electronically signed by Jodelle Red MD Signature Date/Time: 09/04/2022/12:27:53 PM    Final    MONITORS  LONG TERM MONITOR (3-14 DAYS) 10/14/2021  Narrative  Continuous atrial fibrillation throughout recording, 100% burden. Average heart rate was 67 bpm. Maximum heart rate 113.  Bundle branch block was present. Rare PVCs.  Average lowest heart rate 56 bpm. No significant pauses noted.  Overall reassuring monitor with no significant pauses or significant prolonged bradycardic episodes.  Atrial fibrillation  noted throughout tracings. Donato Schultz, MD           Risk Assessment/Calculations:   {Does this patient have ATRIAL FIBRILLATION?:[id number]}        Lab Results  Component Value Date   WBC 8.5 09/06/2022   HGB 13.6 09/06/2022   HCT 41.2 09/06/2022   MCV 95.4 09/06/2022   PLT 137 (L) 09/06/2022   Lab Results  Component Value Date   CREATININE 0.87 09/06/2022   BUN 18 09/06/2022   NA 136 09/06/2022   K 4.0 09/06/2022   CL 103 09/06/2022   CO2 26 09/06/2022   Lab Results  Component Value Date   ALT 14 09/06/2022   AST 28 09/06/2022   ALKPHOS 62 09/06/2022   BILITOT 1.0 09/06/2022   No results found for: "CHOL", "HDL", "LDLCALC", "LDLDIRECT", "TRIG", "CHOLHDL"  No results found for: "HGBA1C"   Assessment & Plan    1.***  2.***  3.***  4.***      Disposition: Follow-up with Donato Schultz, MD or APP in *** months {Are you ordering a CV Procedure (e.g. stress test, cath, DCCV, TEE, etc)?   Press F2        :[id number]}   Medication Adjustments/Labs and Tests Ordered: Current medicines are reviewed at length with the patient today.  Concerns regarding medicines are outlined above.   Signed, Napoleon Form, Leodis Rains, NP 04/30/2023, 4:46 PM [facility]

## 2023-05-02 ENCOUNTER — Ambulatory Visit: Payer: Medicare Other | Admitting: Nurse Practitioner

## 2023-05-20 ENCOUNTER — Ambulatory Visit: Payer: Medicare Other | Admitting: Nurse Practitioner

## 2023-05-20 NOTE — Progress Notes (Unsigned)
Office Visit    Patient Name: Jessica Berg Date of Encounter: 05/20/2023  Primary Care Provider:  Merlene Laughter, MD (Inactive) Primary Cardiologist:  Donato Schultz, MD Primary Electrophysiologist: None   Past Medical History    Past Medical History:  Diagnosis Date   Atrial fibrillation (HCC)    Chronic renal disease, stage II    GERD (gastroesophageal reflux disease)    Hypercholesteremia    MR (mitral regurgitation)    , Mild   Osteoarthritis    Osteoporosis    Past Surgical History:  Procedure Laterality Date   BACK SURGERY     JOINT REPLACEMENT      Allergies  Allergies  Allergen Reactions   Darvocet [Propoxyphene N-Acetaminophen] Nausea And Vomiting   Lodine [Etodolac] Hives    Unknown to patient   Meprozine [Meperidine-Promethazine]     Reaction=tongue swelling   Percocet [Oxycodone-Acetaminophen] Nausea And Vomiting    Dizziness    Ultracet [Tramadol-Acetaminophen] Other (See Comments)    headache   Demerol Hives   Hydrocodone Itching   Penicillins Rash     History of Present Illness    Jessica Berg is a 89 y.o. female with PMH of permanent AF (on Eliquis) HTN, CKD stage IIIb, GERD, HLD, CVA, mitral regurgitation, LAFB, osteoporosis who presents today for 66-month follow-up.  Jessica Berg was originally seen by Dr. Anne Fu and 02/2014 for management of atrial fibrillation. She has been evaluated by EP most recently 2020 for syncope and atrial fibrillation by Dr. Elberta Fortis. Patient has worn Holter monitors in the past and event monitors that were both unremarkable for episodes. She was last seen by Dr. Anne Fu on 02/2022 for follow-up of hypertension and AF. Prior to her appointment she had complaints of lightheadedness and fatigue, noted to fall asleep during conversations. During her visit patient was doing well with no recurrent syncopal episodes. Jessica Berg was admitted through the ED on 09/03/2022 due to mechanical fall.  Patient fell from a barstool  and was not aware if she struck her head.  There was no prodrome symptoms. EKG completed with no acute changes and atrial fibrillation noted with anterior Q waves. Telemetry during 24-hour period revealed no pauses or bradycardia with atrial fibrillation.  She was seen in follow-up on 09/17/2022 and blood pressure was well-controlled with no recurrence of presyncope or lightheadedness.  She politely declined wearing repeat event monitor at that time.  She was encouraged to increase hydration and use compression stockings.  Jessica Berg presents today with her daughter for 62-month follow-up.  Since last being seen in the office patient reports she is unwell with no complaints of presyncope or palpitations.  Her blood pressure today is excellently controlled at 122/62 and heart rate is 80 bpm.  She has been doing well with her current medications and denies any adverse reactions.  She is remaining to stay active and will be celebrating her 89th birthday next month.  She is euvolemic on exam with send trace lower extremity swelling that is chronic.  Patient denies chest pain, palpitations, dyspnea, PND, orthopnea, nausea, vomiting, dizziness, syncope, edema, weight gain, or early satiety.  Home Medications    Current Outpatient Medications  Medication Sig Dispense Refill   apixaban (ELIQUIS) 2.5 MG TABS tablet Take 1 tablet (2.5 mg total) by mouth 2 (two) times daily. 180 tablet 1   brimonidine (ALPHAGAN) 0.2 % ophthalmic solution Place 1 drop into the right eye 2 (two) times daily.     Calcium Carbonate-Vitamin D3  600-400 MG-UNIT TABS Take 1 tablet by mouth daily.     carboxymethylcellulose (REFRESH PLUS) 0.5 % SOLN Place 1 drop into both eyes See admin instructions. Instill 1 drop into both eyes every hour     celecoxib (CELEBREX) 200 MG capsule Take 200 mg by mouth daily.       cholecalciferol (VITAMIN D) 1000 UNITS tablet Take 1,000 Units by mouth daily.       dorzolamide-timolol (COSOPT) 22.3-6.8 MG/ML  ophthalmic solution Place 1 drop into the right eye 2 (two) times daily.     Magnesium Citrate 200 MG TABS Take 400 mg by mouth daily at 6 (six) AM. 60 tablet 11   metoprolol succinate (TOPROL-XL) 25 MG 24 hr tablet TAKE ONE AND ONE-HALF TABLETS DAILY 135 tablet 3   vitamin C (ASCORBIC ACID) 500 MG tablet Take 500 mg by mouth daily.       No current facility-administered medications for this visit.     Review of Systems  Please see the history of present illness.    (+) Trace lower extremity edema ( All other systems reviewed and are otherwise negative except as noted above.  Physical Exam    Wt Readings from Last 3 Encounters:  09/17/22 124 lb (56.2 kg)  09/03/22 123 lb 14.4 oz (56.2 kg)  03/14/22 124 lb (56.2 kg)   ON:GEXBM were no vitals filed for this visit.,There is no height or weight on file to calculate BMI.  Constitutional:      Appearance: Healthy appearance. Not in distress.  Neck:     Vascular: JVD normal.  Pulmonary:     Effort: Pulmonary effort is normal.     Breath sounds: No wheezing. No rales. Diminished in the bases Cardiovascular:     Normal rate. Regular rhythm. Normal S1. Normal S2.      Murmurs: There is no murmur.  Edema:    Peripheral edema trace amount bilaterally Abdominal:     Palpations: Abdomen is soft non tender. There is no hepatomegaly.  Skin:    General: Skin is warm and dry.  Neurological:     General: No focal deficit present.     Mental Status: Alert and oriented to person, place and time.     Cranial Nerves: Cranial nerves are intact.  EKG/LABS/ Recent Cardiac Studies    ECG personally reviewed by me today -none completed today  Cardiac Studies & Procedures       ECHOCARDIOGRAM  ECHOCARDIOGRAM COMPLETE 09/04/2022  Narrative ECHOCARDIOGRAM REPORT    Patient Name:   Jessica Berg Date of Exam: 09/04/2022 Medical Rec #:  [id number]         Height:       60.0 in Accession #:    [id number]        Weight:       123.9  lb Date of Birth:  05-Oct-1924         BSA:          1.523 m Patient Age:    89 years          BP:           112/55 mmHg Patient Gender: F                 HR:           89 bpm. Exam Location:  Inpatient  Procedure: 2D Echo, Cardiac Doppler and Color Doppler  Indications:    Syncope  History:  Patient has no prior history of Echocardiogram examinations. Arrythmias:Atrial Fibrillation; Risk Factors:Hypertension.  Sonographer:    Lucendia Herrlich Sonographer#2:  Sheralyn Boatman RDCS Referring Phys: Consepcion Hearing ANASTASSIA DOUTOVA  IMPRESSIONS   1. Left ventricular ejection fraction, by estimation, is 55 to 60%. The left ventricle has normal function. The left ventricle has no regional wall motion abnormalities. Left ventricular diastolic function could not be evaluated. 2. Right ventricular systolic function is normal. The right ventricular size is normal. There is mildly elevated pulmonary artery systolic pressure. The estimated right ventricular systolic pressure is 41.9 mmHg. 3. Left atrial size was moderately dilated. 4. Right atrial size was moderately dilated. 5. The mitral valve is normal in structure. Trivial mitral valve regurgitation. No evidence of mitral stenosis. Moderate mitral annular calcification. 6. The aortic valve is tricuspid. There is mild calcification of the aortic valve. There is mild thickening of the aortic valve. Aortic valve regurgitation is mild. Aortic valve sclerosis is present, with no evidence of aortic valve stenosis. 7. The inferior vena cava is normal in size with greater than 50% respiratory variability, suggesting right atrial pressure of 3 mmHg.  Comparison(s): No prior Echocardiogram.  Conclusion(s)/Recommendation(s): Otherwise normal echocardiogram, with minor abnormalities described in the report.  FINDINGS Left Ventricle: Left ventricular ejection fraction, by estimation, is 55 to 60%. The left ventricle has normal function. The left ventricle has no  regional wall motion abnormalities. The left ventricular internal cavity size was normal in size. There is no left ventricular hypertrophy. Left ventricular diastolic function could not be evaluated due to atrial fibrillation. Left ventricular diastolic function could not be evaluated.  Right Ventricle: The right ventricular size is normal. Right vetricular wall thickness was not well visualized. Right ventricular systolic function is normal. There is mildly elevated pulmonary artery systolic pressure. The tricuspid regurgitant velocity is 3.12 m/s, and with an assumed right atrial pressure of 3 mmHg, the estimated right ventricular systolic pressure is 41.9 mmHg.  Left Atrium: Left atrial size was moderately dilated.  Right Atrium: Right atrial size was moderately dilated.  Pericardium: There is no evidence of pericardial effusion.  Mitral Valve: The mitral valve is normal in structure. There is mild thickening of the mitral valve leaflet(s). There is mild calcification of the mitral valve leaflet(s). Moderate mitral annular calcification. Trivial mitral valve regurgitation. No evidence of mitral valve stenosis.  Tricuspid Valve: The tricuspid valve is normal in structure. Tricuspid valve regurgitation is trivial. No evidence of tricuspid stenosis.  Aortic Valve: The aortic valve is tricuspid. There is mild calcification of the aortic valve. There is mild thickening of the aortic valve. Aortic valve regurgitation is mild. Aortic regurgitation PHT measures 723 msec. Aortic valve sclerosis is present, with no evidence of aortic valve stenosis.  Pulmonic Valve: The pulmonic valve was not well visualized. Pulmonic valve regurgitation is mild. No evidence of pulmonic stenosis.  Aorta: The aortic root, ascending aorta and aortic arch are all structurally normal, with no evidence of dilitation or obstruction.  Venous: The inferior vena cava is normal in size with greater than 50% respiratory  variability, suggesting right atrial pressure of 3 mmHg.  IAS/Shunts: The atrial septum is grossly normal.   LEFT VENTRICLE PLAX 2D LVIDd:         4.50 cm LVIDs:         3.37 cm LV PW:         0.80 cm LV IVS:        0.80 cm LVOT diam:     1.90  cm LV SV:         44 LV SV Index:   29 LVOT Area:     2.84 cm  LV Volumes (MOD) LV vol d, MOD A2C: 44.4 ml LV vol d, MOD A4C: 42.4 ml LV vol s, MOD A2C: 13.3 ml LV vol s, MOD A4C: 14.5 ml LV SV MOD A2C:     31.1 ml LV SV MOD A4C:     42.4 ml LV SV MOD BP:      29.3 ml  RIGHT VENTRICLE RV S prime:     10.80 cm/s TAPSE (M-mode): 2.0 cm  LEFT ATRIUM             Index        RIGHT ATRIUM           Index LA Vol (A2C):   47.4 ml 31.13 ml/m  RA Area:     18.10 cm LA Vol (A4C):   56.0 ml 36.77 ml/m  RA Volume:   55.90 ml  36.71 ml/m LA Biplane Vol: 53.0 ml 34.80 ml/m AORTIC VALVE LVOT Vmax:   74.40 cm/s LVOT Vmean:  53.000 cm/s LVOT VTI:    0.154 m AI PHT:      723 msec  AORTA Ao Root diam: 2.90 cm Ao Asc diam:  3.30 cm  MITRAL VALVE               TRICUSPID VALVE MV Area (PHT): 4.21 cm    TR Peak grad:   38.9 mmHg MV Decel Time: 180 msec    TR Vmax:        312.00 cm/s MV E velocity: 83.10 cm/s MV A velocity: 34.70 cm/s  SHUNTS MV E/A ratio:  2.39        Systemic VTI:  0.15 m Systemic Diam: 1.90 cm  Jodelle Red MD Electronically signed by Jodelle Red MD Signature Date/Time: 09/04/2022/12:27:53 PM    Final    MONITORS  LONG TERM MONITOR (3-14 DAYS) 10/14/2021  Narrative  Continuous atrial fibrillation throughout recording, 100% burden. Average heart rate was 67 bpm. Maximum heart rate 113.  Bundle branch block was present. Rare PVCs.  Average lowest heart rate 56 bpm. No significant pauses noted.  Overall reassuring monitor with no significant pauses or significant prolonged bradycardic episodes.  Atrial fibrillation noted throughout tracings. Donato Schultz, MD           Risk  Assessment/Calculations:    CHA2DS2-VASc Score = 6   This indicates a 9.7% annual risk of stroke. The patient's score is based upon: CHF History: 0 HTN History: 1 Diabetes History: 0 Stroke History: 2 Vascular Disease History: 0 Age Score: 2 Gender Score: 1           Lab Results  Component Value Date   WBC 8.5 09/06/2022   HGB 13.6 09/06/2022   HCT 41.2 09/06/2022   MCV 95.4 09/06/2022   PLT 137 (L) 09/06/2022   Lab Results  Component Value Date   CREATININE 0.87 09/06/2022   BUN 18 09/06/2022   NA 136 09/06/2022   K 4.0 09/06/2022   CL 103 09/06/2022   CO2 26 09/06/2022   Lab Results  Component Value Date   ALT 14 09/06/2022   AST 28 09/06/2022   ALKPHOS 62 09/06/2022   BILITOT 1.0 09/06/2022   No results found for: "CHOL", "HDL", "LDLCALC", "LDLDIRECT", "TRIG", "CHOLHDL"  No results found for: "HGBA1C"   Assessment & Plan    1.  Syncope: -Today patient  reports no presyncope or lightheadedness -Hydration and compression stockings were discussed and encouraged   2.  Permanent atrial fibrillation: -Patient currently rate controlled with Toprol 37.5 mg daily -She is currently on dose reduced Eliquis at 2.5 mg twice daily due to age and weight.   3.  Essential hypertension: Patient's blood pressure today was well controlled at 122/62 -Continue Toprol 37.5 mg daily   4.  History of mitral regurgitation: -Recent 2D echo completed noting EF of 55-60%, with trivial MV regurgitation and no evidence of mitral stenosis -Continue blood pressure medication and heart rate control with Toprol-XL     Disposition: Follow-up with Donato Schultz, MD or APP in 12 months    Medication Adjustments/Labs and Tests Ordered: Current medicines are reviewed at length with the patient today.  Concerns regarding medicines are outlined above.   Signed, Napoleon Form, Leodis Rains, NP 05/20/2023, 7:48 PM [facility]

## 2023-05-21 ENCOUNTER — Ambulatory Visit: Payer: Medicare Other | Attending: Nurse Practitioner | Admitting: Nurse Practitioner

## 2023-05-21 ENCOUNTER — Encounter: Payer: Self-pay | Admitting: Nurse Practitioner

## 2023-05-21 VITALS — BP 122/62 | HR 80 | Ht 60.0 in | Wt 127.2 lb

## 2023-05-21 DIAGNOSIS — I340 Nonrheumatic mitral (valve) insufficiency: Secondary | ICD-10-CM

## 2023-05-21 DIAGNOSIS — I4821 Permanent atrial fibrillation: Secondary | ICD-10-CM | POA: Diagnosis not present

## 2023-05-21 DIAGNOSIS — I10 Essential (primary) hypertension: Secondary | ICD-10-CM

## 2023-05-21 DIAGNOSIS — R55 Syncope and collapse: Secondary | ICD-10-CM | POA: Diagnosis not present

## 2023-05-21 NOTE — Patient Instructions (Signed)
Medication Instructions:  Your physician recommends that you continue on your current medications as directed. Please refer to the Current Medication list given to you today. *If you need a refill on your cardiac medications before your next appointment, please call your pharmacy*   Lab Work: None Ordered  If you have labs (blood work) drawn today and your tests are completely normal, you will receive your results only by: MyChart Message (if you have MyChart) OR A paper copy in the mail If you have any lab test that is abnormal or we need to change your treatment, we will call you to review the results.   Testing/Procedures: None Ordered    Follow-Up: At [facility], you and your health needs are our priority.  As part of our continuing mission to provide you with exceptional heart care, we have created designated Provider Care Teams.  These Care Teams include your primary Cardiologist (physician) and Advanced Practice Providers (APPs -  Physician Assistants and Nurse Practitioners) who all work together to provide you with the care you need, when you need it.  We recommend signing up for the patient portal called "MyChart".  Sign up information is provided on this After Visit Summary.  MyChart is used to connect with patients for Virtual Visits (Telemedicine).  Patients are able to view lab/test results, encounter notes, upcoming appointments, etc.  Non-urgent messages can be sent to your provider as well.   To learn more about what you can do with MyChart, go to [email or web address].    Your next appointment:   12 month(s)  Provider:   Donato Schultz, MD     Other Instructions

## 2023-06-23 ENCOUNTER — Telehealth: Payer: Self-pay | Admitting: Cardiology

## 2023-06-23 NOTE — Telephone Encounter (Signed)
Spoke with pt's daughter Jessica Berg.  BP 127/69 HR 80.  Per pt she has the episodes with position changes.  Advised to use caution when changing positions, stay well hydrated and wear knee high compression stockings daily.  She and daughter state understanding.  They will continue to monitor blood pressures weekly and if any episodes of feeling faint.

## 2023-06-23 NOTE — Telephone Encounter (Signed)
Pt's daughter returning nurses call. Please advise 

## 2023-06-23 NOTE — Telephone Encounter (Signed)
Called and spoke with pt since Marcelino Duster is not on DPR.  Pt reports "just feeling faint all of the sudden" 2 days ago.  This has happened twice.  She states it is hard for her to explain but "this has happened before - a long time ago".  She is unable to check her VS because of her decreased vision.  Pt asked that I call her daughter Marcelino Duster and speak with her.  Spoke with Marcelino Duster, advised pt's DPR does need to be updated the next time pt comes into office.  Advised pt gave verbal permission for me to speak with her currently.   Marcelino Duster reports she was with pt all day yesterday and she was "fine" and without complaints.  She reports the faint feeling seems to occur when standing up and that pt states she needed to take time before walking to make sure she wasn't going to fall.  No VS currently but home health is supposed to be there today at 3 pm.  She will ask them to check pt's heart rate and blood pressure.  She will call back after that information has be obtained.

## 2023-06-23 NOTE — Telephone Encounter (Signed)
STAT if patient feels like he/she is going to faint   Are you dizzy now? no  Do you feel faint or have you passed out? No, has passed out in the past  Do you have any other symptoms? Vision goes black then she comes back to  Have you checked your HR and BP (record if available)? no

## 2023-09-04 ENCOUNTER — Emergency Department (HOSPITAL_BASED_OUTPATIENT_CLINIC_OR_DEPARTMENT_OTHER): Payer: Medicare Other

## 2023-09-04 ENCOUNTER — Emergency Department (HOSPITAL_BASED_OUTPATIENT_CLINIC_OR_DEPARTMENT_OTHER)
Admission: EM | Admit: 2023-09-04 | Discharge: 2023-09-04 | Disposition: A | Discharge status: Home / Self Care | Payer: Medicare Other | Attending: Emergency Medicine | Admitting: Emergency Medicine

## 2023-09-04 ENCOUNTER — Emergency Department (HOSPITAL_BASED_OUTPATIENT_CLINIC_OR_DEPARTMENT_OTHER): Payer: Medicare Other | Admitting: Radiology

## 2023-09-04 ENCOUNTER — Other Ambulatory Visit: Payer: Self-pay

## 2023-09-04 ENCOUNTER — Encounter (HOSPITAL_BASED_OUTPATIENT_CLINIC_OR_DEPARTMENT_OTHER): Payer: Self-pay | Admitting: Emergency Medicine

## 2023-09-04 DIAGNOSIS — Z8673 Personal history of transient ischemic attack (TIA), and cerebral infarction without residual deficits: Secondary | ICD-10-CM | POA: Diagnosis not present

## 2023-09-04 DIAGNOSIS — I4891 Unspecified atrial fibrillation: Secondary | ICD-10-CM | POA: Insufficient documentation

## 2023-09-04 DIAGNOSIS — R251 Tremor, unspecified: Secondary | ICD-10-CM | POA: Diagnosis not present

## 2023-09-04 DIAGNOSIS — I252 Old myocardial infarction: Secondary | ICD-10-CM | POA: Diagnosis not present

## 2023-09-04 DIAGNOSIS — R410 Disorientation, unspecified: Secondary | ICD-10-CM | POA: Diagnosis not present

## 2023-09-04 DIAGNOSIS — N189 Chronic kidney disease, unspecified: Secondary | ICD-10-CM | POA: Insufficient documentation

## 2023-09-04 DIAGNOSIS — Z7901 Long term (current) use of anticoagulants: Secondary | ICD-10-CM | POA: Insufficient documentation

## 2023-09-04 DIAGNOSIS — Z20822 Contact with and (suspected) exposure to covid-19: Secondary | ICD-10-CM | POA: Diagnosis not present

## 2023-09-04 DIAGNOSIS — R42 Dizziness and giddiness: Secondary | ICD-10-CM | POA: Insufficient documentation

## 2023-09-04 LAB — COMPREHENSIVE METABOLIC PANEL
ALT: 9 U/L (ref 0–44)
AST: 20 U/L (ref 15–41)
Albumin: 3.7 g/dL (ref 3.5–5.0)
Alkaline Phosphatase: 74 U/L (ref 38–126)
Anion gap: 7 (ref 5–15)
BUN: 28 mg/dL — ABNORMAL HIGH (ref 8–23)
CO2: 29 mmol/L (ref 22–32)
Calcium: 9.3 mg/dL (ref 8.9–10.3)
Chloride: 103 mmol/L (ref 98–111)
Creatinine, Ser: 0.94 mg/dL (ref 0.44–1.00)
GFR, Estimated: 55 mL/min — ABNORMAL LOW (ref >60)
Glucose, Bld: 94 mg/dL (ref 70–99)
Potassium: 4 mmol/L (ref 3.5–5.1)
Sodium: 139 mmol/L (ref 135–145)
Total Bilirubin: 0.9 mg/dL (ref 0.3–1.2)
Total Protein: 6.4 g/dL — ABNORMAL LOW (ref 6.5–8.1)

## 2023-09-04 LAB — URINALYSIS, ROUTINE W REFLEX MICROSCOPIC
Bilirubin Urine: NEGATIVE
Glucose, UA: NEGATIVE mg/dL
Hgb urine dipstick: NEGATIVE
Ketones, ur: NEGATIVE mg/dL
Leukocytes,Ua: NEGATIVE
Nitrite: NEGATIVE
Protein, ur: NEGATIVE mg/dL
Specific Gravity, Urine: 1.01 (ref 1.005–1.030)
pH: 6.5 (ref 5.0–8.0)

## 2023-09-04 LAB — CBG MONITORING, ED: Glucose-Capillary: 93 mg/dL (ref 70–99)

## 2023-09-04 LAB — CBC
HCT: 39.3 % (ref 36.0–46.0)
Hemoglobin: 12.8 g/dL (ref 12.0–15.0)
MCH: 31.2 pg (ref 26.0–34.0)
MCHC: 32.6 g/dL (ref 30.0–36.0)
MCV: 95.9 fL (ref 80.0–100.0)
Platelets: 169 10*3/uL (ref 150–400)
RBC: 4.1 MIL/uL (ref 3.87–5.11)
RDW: 12.2 % (ref 11.5–15.5)
WBC: 6.3 10*3/uL (ref 4.0–10.5)
nRBC: 0 % (ref 0.0–0.2)

## 2023-09-04 LAB — TSH: TSH: 4.85 u[IU]/mL — ABNORMAL HIGH (ref 0.350–4.500)

## 2023-09-04 LAB — RESP PANEL BY RT-PCR (RSV, FLU A&B, COVID)  RVPGX2
Influenza A by PCR: NEGATIVE
Influenza B by PCR: NEGATIVE
Resp Syncytial Virus by PCR: NEGATIVE
SARS Coronavirus 2 by RT PCR: NEGATIVE

## 2023-09-04 LAB — TROPONIN I (HIGH SENSITIVITY)
Troponin I (High Sensitivity): 10 ng/L (ref <18)
Troponin I (High Sensitivity): 12 ng/L (ref <18)

## 2023-09-04 LAB — MAGNESIUM: Magnesium: 1.8 mg/dL (ref 1.7–2.4)

## 2023-09-04 NOTE — ED Notes (Signed)
Patient transported to MRI 

## 2023-09-04 NOTE — ED Provider Notes (Signed)
[place] EMERGENCY DEPARTMENT AT [facility] Provider Note   CSN: [id number] Arrival date & time: 09/04/23  1603     History  Chief Complaint  Patient presents with   Dizziness    Jessica Berg is a 89 y.o. female.  89 year old female with a history of atrial fibrillation on Eliquis, CKD, and stroke without any residual deficits who presents to the emergency department with intermittent spells since the summer.  Patient reports that since the summer she has been having intermittent spells.  Patient and family state that during the spells she will feel lightheaded and feeling parts that she will be confused.  Occasionally will have shaking of her entire body associated with them but it is alert and conscious during this.  Says that they have become more frequent and wanted to bring her into the emergency department for evaluation.  They are concerned that she may have had a stroke.  No weakness or numbness of her arms or legs or slurred speech.  Initially told triage that she has palpitations during these episodes but denies this to me.       Home Medications Prior to Admission medications   Medication Sig Start Date End Date Taking? Authorizing Provider  acetaminophen (TYLENOL) 500 MG tablet Take 500 mg by mouth every 8 (eight) hours as needed for mild pain, fever, headache or moderate pain.    [provider]  apixaban (ELIQUIS) 2.5 MG TABS tablet Take 1 tablet (2.5 mg total) by mouth 2 (two) times daily. 06/21/22   Jake Bathe, MD  Calcium Carbonate-Vitamin D3 600-400 MG-UNIT TABS Take 1 tablet by mouth daily.    [provider]  carboxymethylcellulose (REFRESH PLUS) 0.5 % SOLN Place 1 drop into both eyes See admin instructions. Instill 1 drop into both eyes every hour    [provider]  celecoxib (CELEBREX) 200 MG capsule Take 200 mg by mouth daily.      [provider]  cholecalciferol (VITAMIN D) 1000 UNITS tablet Take 1,000  Units by mouth daily.      [provider]  Magnesium Citrate 200 MG TABS Take 400 mg by mouth daily at 6 (six) AM. 09/17/22 09/18/23  Gaston Islam., NP  metoprolol succinate (TOPROL-XL) 25 MG 24 hr tablet TAKE ONE AND ONE-HALF TABLETS DAILY 02/20/23   Jake Bathe, MD  vitamin C (ASCORBIC ACID) 500 MG tablet Take 500 mg by mouth daily.      [provider]      Allergies    Darvocet [propoxyphene n-acetaminophen], Lodine [etodolac], Meprozine [meperidine-promethazine], Percocet [oxycodone-acetaminophen], Ultracet [tramadol-acetaminophen], Demerol, Hydrocodone, and Penicillins    Review of Systems   Review of Systems  Physical Exam Updated Vital Signs BP (!) 157/80   Pulse 76   Temp 98.4 F (36.9 C) (Oral)   Resp 18   SpO2 100%  Physical Exam Vitals and nursing note reviewed.  Constitutional:      General: She is not in acute distress.    Appearance: She is well-developed.  HENT:     Head: Normocephalic and atraumatic.     Right Ear: External ear normal.     Left Ear: External ear normal.     Nose: Nose normal.  Eyes:     Extraocular Movements: Extraocular movements intact.     Conjunctiva/sclera: Conjunctivae normal.     Pupils: Pupils are equal, round, and reactive to light.  Cardiovascular:     Rate and Rhythm: Normal rate and regular  rhythm.     Heart sounds: No murmur heard. Pulmonary:     Effort: Pulmonary effort is normal. No respiratory distress.     Breath sounds: Normal breath sounds.  Abdominal:     General: Abdomen is flat. There is no distension.     Palpations: Abdomen is soft. There is no mass.     Tenderness: There is no abdominal tenderness. There is no guarding.  Musculoskeletal:     Cervical back: Normal range of motion and neck supple.     Right lower leg: No edema.     Left lower leg: No edema.  Skin:    General: Skin is warm and dry.  Neurological:     General: No focal deficit present.     Mental Status: She is alert and  oriented to person, place, and time. Mental status is at baseline.     Cranial Nerves: No cranial nerve deficit.     Sensory: No sensory deficit.     Motor: No weakness.  Psychiatric:        Mood and Affect: Mood normal.     ED Results / Procedures / Treatments   Labs (all labs ordered are listed, but only abnormal results are displayed) Labs Reviewed  TSH - Abnormal; Notable for the following components:      Result Value   TSH 4.850 (*)    All other components within normal limits  COMPREHENSIVE METABOLIC PANEL - Abnormal; Notable for the following components:   BUN 28 (*)    Total Protein 6.4 (*)    GFR, Estimated 55 (*)    All other components within normal limits  RESP PANEL BY RT-PCR (RSV, FLU A&B, COVID)  [id number]  MAGNESIUM  CBC  URINALYSIS, ROUTINE W REFLEX MICROSCOPIC  CBG MONITORING, ED  TROPONIN I (HIGH SENSITIVITY)  TROPONIN I (HIGH SENSITIVITY)    EKG EKG Interpretation Date/Time:  Thursday September 04 2023 16:17:32 EDT Ventricular Rate:  82 PR Interval:    QRS Duration:  98 QT Interval:  360 QTC Calculation: 421 R Axis:   -37  Text Interpretation: Atrial fibrillation Left axis deviation Anterior infarct, old Confirmed by Vonita Moss [phone]) on 09/04/2023 5:11:13 PM  Radiology DG Chest 2 View  Result Date: 09/04/2023 CLINICAL DATA:  Rigors, dizziness EXAM: CHEST - 2 VIEW COMPARISON:  09/03/2022 FINDINGS: Prominent coarse interstitial markings as before. No new infiltrate or overt edema. Heart size and mediastinal contours are within normal limits. Aortic Atherosclerosis (ICD10-170.0). No effusion. Visualized bones unremarkable. IMPRESSION: Chronic interstitial changes. No acute findings. Electronically Signed   By: Corlis Leak M.D.   On: 09/04/2023 19:24   MR BRAIN WO CONTRAST  Result Date: 09/04/2023 CLINICAL DATA:  Dizziness, confusion EXAM: MRI HEAD WITHOUT CONTRAST TECHNIQUE: Multiplanar, multiecho pulse sequences of the brain and surrounding  structures were obtained without intravenous contrast. COMPARISON:  Same-day CT head, brain MRI 12/26/2018 FINDINGS: Brain: There is no acute intracranial hemorrhage, extra-axial fluid collection, or acute infarct. Parenchymal volume is within expected limits for age. The ventricles are normal in size. Patchy and confluent FLAIR signal abnormality in the supratentorial white matter likely reflects sequela of moderate chronic small-vessel ischemic change The pituitary and suprasellar region are normal. There is no mass lesion there is no mass effect or midline shift. Vascular: Normal flow voids. Skull and upper cervical spine: Normal marrow signal. Sinuses/Orbits: The paranasal sinuses are clear. Bilateral lens implants are in place. The globes and orbits are otherwise unremarkable. Other: The mastoid air cells  and middle ear cavities are clear. IMPRESSION: No acute intracranial pathology. Electronically Signed   By: Lesia Hausen M.D.   On: 09/04/2023 19:24    Procedures Procedures    Medications Ordered in ED Medications - No data to display  ED Course/ Medical Decision Making/ A&P                                 Medical Decision Making Amount and/or Complexity of Data Reviewed Labs: ordered. Radiology: ordered.   TRILLIUM GUNNISON is a 89 y.o. female with comorbidities that complicate the patient evaluation including atrial fibrillation on Eliquis, CKD, and stroke without any residual deficits who presents to the emergency department with intermittent spells since the summer.    Initial Ddx:  Seizure, paroxysmal arrhythmia, stroke, MI, electrolyte abnormality, rigors, infection  MDM/Course:  Patient presents to the emergency department with spells that have been going on since the summer.  Very difficult to figure out exactly what happens during the spells after spending an extensive amount of time talking to the patient and her family.  Appears that she gets lightheaded, has difficulty  concentrating, and occasionally will have some shaking.  This may reflect a paroxysmal arrhythmia.  Patient and her family were also worried about a possible stroke given her history but feel this is less likely.  She had lab work that was sent that did not reveal any electrolyte abnormalities or signs of an MI.  Also had an infectious workup just in case these are from rigors but her COVID and flu and UA and chest x-ray did not reveal any signs of infection.  Had an MRI that did not show evidence of stroke.  Upon re-evaluation patient remained stable did not have any more of these episodes in the emergency department.  Encouraged the patient's family to record these episodes when they happen and have her follow-up with her primary doctor to see if she needs to follow-up with neurology or cardiology.  This patient presents to the ED for concern of complaints listed in HPI, this involves an extensive number of treatment options, and is a complaint that carries with it a high risk of complications and morbidity. Disposition including potential need for admission considered.   Dispo: DC Home. Return precautions discussed including, but not limited to, those listed in the AVS. Allowed pt time to ask questions which were answered fully prior to dc.  Additional history obtained from son Records reviewed Outpatient Clinic Notes The following labs were independently interpreted: Serial Troponins and show no acute abnormality I personally reviewed and interpreted cardiac monitoring: atrial fibrillation (normal rate) I personally reviewed and interpreted the pt's EKG: see above for interpretation  I have reviewed the patients home medications and made adjustments as needed Social Determinants of health:  Elderly         Final Clinical Impression(s) / ED Diagnoses Final diagnoses:  Light-headed feeling    Rx / DC Orders ED Discharge Orders     None         Rondel Baton, MD 09/05/23  1114

## 2023-09-04 NOTE — ED Notes (Signed)
Reviewed AVS/discharge instruction with patient. Time allotted for and all questions answered. Patient is agreeable for d/c and escorted to ed exit by staff.  

## 2023-09-04 NOTE — ED Triage Notes (Signed)
Pt presents with family.  Hx of afib.  This morning began having weakness, dizziness, reported heart "fluttering" to her family but denies this at triage. No CP.  No falls reports. Takes Eliquis.  Pt is  trembling and anxious.  More confused per family.

## 2023-09-04 NOTE — Discharge Instructions (Signed)
You were seen for your dizziness in the emergency department.   At home, please stay well hydrated.    Check your MyChart online for the results of any tests that had not resulted by the time you left the emergency department.   Follow-up with your primary doctor in 2-3 days regarding your visit.    Return immediately to the emergency department if you experience any of the following: chest pain, palpitations, fainting, or any other concerning symptoms.    Thank you for visiting our Emergency Department. It was a pleasure taking care of you today.

## 2023-10-08 ENCOUNTER — Ambulatory Visit
Admission: RE | Admit: 2023-10-08 | Discharge: 2023-10-08 | Disposition: A | Discharge status: Home / Self Care | Payer: Medicare Other | Source: Ambulatory Visit | Attending: Internal Medicine | Admitting: Internal Medicine

## 2023-10-08 ENCOUNTER — Other Ambulatory Visit: Payer: Self-pay | Admitting: Internal Medicine

## 2023-10-08 DIAGNOSIS — R0989 Other specified symptoms and signs involving the circulatory and respiratory systems: Secondary | ICD-10-CM

## 2023-10-10 ENCOUNTER — Other Ambulatory Visit (HOSPITAL_COMMUNITY): Payer: Self-pay | Admitting: Internal Medicine

## 2023-10-10 DIAGNOSIS — R7989 Other specified abnormal findings of blood chemistry: Secondary | ICD-10-CM

## 2023-10-27 ENCOUNTER — Ambulatory Visit (HOSPITAL_BASED_OUTPATIENT_CLINIC_OR_DEPARTMENT_OTHER): Payer: Medicare Other

## 2023-10-27 DIAGNOSIS — I517 Cardiomegaly: Secondary | ICD-10-CM | POA: Diagnosis not present

## 2023-10-27 DIAGNOSIS — E8770 Fluid overload, unspecified: Secondary | ICD-10-CM | POA: Diagnosis not present

## 2023-10-27 DIAGNOSIS — R7989 Other specified abnormal findings of blood chemistry: Secondary | ICD-10-CM

## 2023-10-27 DIAGNOSIS — I088 Other rheumatic multiple valve diseases: Secondary | ICD-10-CM

## 2023-10-28 LAB — ECHOCARDIOGRAM COMPLETE
AV Vena cont: 0.2 cm
Area-P 1/2: 5.27 cm2
MV M vel: 4.05 m/s
MV Peak grad: 65.6 mm[Hg]
P 1/2 time: 878 ms
S' Lateral: 2.39 cm

## 2024-07-06 ENCOUNTER — Ambulatory Visit: Admitting: Cardiology

## 2024-07-21 ENCOUNTER — Ambulatory Visit: Admitting: Cardiology

## 2024-09-08 NOTE — Progress Notes (Signed)
 Cardiology Office Note    Patient Name: Jessica Berg Date of Encounter: 09/09/2024  Primary Care Provider:  Charlott Dorn LABOR, MD Primary Cardiologist:  Oneil Parchment, MD Primary Electrophysiologist: None   Past Medical History    Past Medical History:  Diagnosis Date   Atrial fibrillation (HCC)    Chronic renal disease, stage II    GERD (gastroesophageal reflux disease)    Hypercholesteremia    MR (mitral regurgitation)    , Mild   Osteoarthritis    Osteoporosis     History of Present Illness  Jessica Berg is a 89 y.o. female with PMH of permanent AF (on Eliquis ) HTN, CKD stage IIIb, GERD, HLD, CVA, mitral regurgitation, LAFB, osteoporosis who presents today for annual follow-up.  [name] was last seen on 05/21/2023 for annual follow-up.  During her visit she was doing well with no complaints of syncope or palpitations.  Her blood pressure was stable at 122/62.  She was staying active and was celebrating her recent 20th birthday.  She was seen in the ED on 09/04/2023 with complaint of dizziness and intermittent lightheadedness. Patient and her family were also worried about a possible stroke given her history but feel this is less likely. She had lab work that was sent that did not reveal any electrolyte abnormalities or signs of an MI.  She was encouraged to monitor for similar episodes and follow-up with neurology or cardiology.  [name] presents today with her daughter for annual follow-up. She was last seen in June of the previous year and had an emergency room visit in September of last year due to dizziness and lightheadedness. A workup was done at that time to rule out a stroke, which was deemed less likely. The dizziness has since resolved, although she occasionally experiences dizziness when moving too quickly.  She has a history of atrial fibrillation and is on metoprolol  to control her heart rate. No recent palpitations, fast heart rates, chest pain, or  tightness. She has not experienced any bleeding, although she does bruise easily. She maintains some level of physical activity by walking with a rollator around her home and sitting outside on the porch. Her daughter reports that she keeps her legs elevated frequently to manage swelling. No shortness of breath or wheezing, although her daughter occasionally hears a wheeze when she breathes. No new cardiac symptoms since her last visit. Patient denies chest pain, palpitations, dyspnea, PND, orthopnea, nausea, vomiting, dizziness, syncope, edema, weight gain, or early satiety.  Discussed the use of AI scribe software for clinical note transcription with the patient, who gave verbal consent to proceed.  History of Present Illness   Review of Systems  Please see the history of present illness.    All other systems reviewed and are otherwise negative except as noted above.  Physical Exam    Wt Readings from Last 3 Encounters:  09/09/24 117 lb (53.1 kg)  05/21/23 127 lb 3.2 oz (57.7 kg)  09/17/22 124 lb (56.2 kg)   VS: Vitals:   09/09/24 1552  BP: (!) 144/80  Pulse: 71  SpO2: 99%  ,Body mass index is 22.85 kg/m. GEN: Well nourished, well developed in no acute distress Neck: No JVD; No carotid bruits Pulmonary: Clear to auscultation without rales, wheezing or rhonchi  Cardiovascular: Irregularly irregular normal S1. Normal S2.   Murmurs: There is no murmur.  ABDOMEN: Soft, non-tender, non-distended EXTREMITIES: Bilateral +1 lower extremity edema  EKG/LABS/ Recent Cardiac Studies   ECG personally reviewed  by me today -atrial fibrillation with left axis deviation and left fascicular block with 62 bpm and no acute changes.  Risk Assessment/Calculations:    CHA2DS2-VASc Score = 7  {Confirm score is correct.  If not, click here to update score.  REFRESH note.  :1} This indicates a 11.2% annual risk of stroke. The patient's score is based upon: CHF History: 0 HTN History: 1 Diabetes  History: 0 Stroke History: 2 Vascular Disease History: 1 Age Score: 2 Gender Score: 1   {This patient has a significant risk of stroke if diagnosed with atrial fibrillation.  Please consider VKA or DOAC agent for anticoagulation if the bleeding risk is acceptable.   You can also use the SmartPhrase .[email or web address] for documentation.   :[id number]}      Lab Results  Component Value Date   WBC 6.3 09/04/2023   HGB 12.8 09/04/2023   HCT 39.3 09/04/2023   MCV 95.9 09/04/2023   PLT 169 09/04/2023   Lab Results  Component Value Date   CREATININE 0.94 09/04/2023   BUN 28 (H) 09/04/2023   NA 139 09/04/2023   K 4.0 09/04/2023   CL 103 09/04/2023   CO2 29 09/04/2023   No results found for: CHOL, HDL, LDLCALC, LDLDIRECT, TRIG, CHOLHDL  No results found for: HGBA1C Assessment & Plan    Assessment & Plan  1.  Syncope: -Today patient reports no presyncope or lightheadedness -Hydration and compression stockings were discussed and encouraged   2.  Permanent atrial fibrillation: -Patient currently rate controlled by EKG with Toprol  37.5 mg daily -She is currently on dose reduced Eliquis  at 2.5 mg twice daily due to age and weight.   3.  Essential hypertension: Patient's blood pressure today was well controlled at 144/80 -Continue Toprol  37.5 mg daily   4.  History of mitral regurgitation: -Recent 2D echo completed noting EF of 55-60%, with trivial MV regurgitation and no evidence of mitral stenosis -Continue blood pressure medication and heart rate control with Toprol -XL  Disposition: Follow-up with Oneil Parchment, MD or APP in 12 months    Signed, Wyn Raddle, Jackee Shove, NP 09/09/2024, 4:34 PM [facility]

## 2024-09-09 ENCOUNTER — Ambulatory Visit: Attending: Nurse Practitioner | Admitting: Nurse Practitioner

## 2024-09-09 ENCOUNTER — Encounter: Payer: Self-pay | Admitting: Nurse Practitioner

## 2024-09-09 VITALS — BP 144/80 | HR 71 | Ht 60.0 in | Wt 117.0 lb

## 2024-09-09 DIAGNOSIS — I340 Nonrheumatic mitral (valve) insufficiency: Secondary | ICD-10-CM

## 2024-09-09 DIAGNOSIS — I4821 Permanent atrial fibrillation: Secondary | ICD-10-CM

## 2024-09-09 DIAGNOSIS — I10 Essential (primary) hypertension: Secondary | ICD-10-CM

## 2024-09-09 DIAGNOSIS — R55 Syncope and collapse: Secondary | ICD-10-CM

## 2024-09-09 NOTE — Patient Instructions (Signed)
 Medication Instructions:  Your physician recommends that you continue on your current medications as directed. Please refer to the Current Medication list given to you today. *If you need a refill on your cardiac medications before your next appointment, please call your pharmacy*  Lab Work: None ordered If you have labs (blood work) drawn today and your tests are completely normal, you will receive your results only by: MyChart Message (if you have MyChart) OR A paper copy in the mail If you have any lab test that is abnormal or we need to change your treatment, we will call you to review the results.  Testing/Procedures: None ordered  Follow-Up: At [facility], you and your health needs are our priority.  As part of our continuing mission to provide you with exceptional heart care, our providers are all part of one team.  This team includes your primary Cardiologist (physician) and Advanced Practice Providers or APPs (Physician Assistants and Nurse Practitioners) who all work together to provide you with the care you need, when you need it.  Your next appointment:   12 month(s)  Provider:   Oneil Parchment, MD    We recommend signing up for the patient portal called MyChart.  Sign up information is provided on this After Visit Summary.  MyChart is used to connect with patients for Virtual Visits (Telemedicine).  Patients are able to view lab/test results, encounter notes, upcoming appointments, etc.  Non-urgent messages can be sent to your provider as well.   To learn more about what you can do with MyChart, go to [email or web address].   Other Instructions

## 2024-12-29 ENCOUNTER — Inpatient Hospital Stay (HOSPITAL_COMMUNITY): Admitting: Anesthesiology

## 2024-12-29 ENCOUNTER — Emergency Department (HOSPITAL_COMMUNITY)

## 2024-12-29 ENCOUNTER — Inpatient Hospital Stay (HOSPITAL_COMMUNITY)

## 2024-12-29 ENCOUNTER — Encounter (HOSPITAL_COMMUNITY)
Admission: EM | Disposition: A | Discharge status: Skilled Nursing Facility | Payer: Self-pay | Source: Home / Self Care | Attending: Internal Medicine

## 2024-12-29 ENCOUNTER — Inpatient Hospital Stay (HOSPITAL_COMMUNITY)
Admission: EM | Admit: 2024-12-29 | Discharge: 2025-01-03 | DRG: 481 | Disposition: A | Discharge status: Skilled Nursing Facility | Attending: Internal Medicine | Admitting: Internal Medicine

## 2024-12-29 ENCOUNTER — Encounter (HOSPITAL_COMMUNITY): Payer: Self-pay

## 2024-12-29 ENCOUNTER — Other Ambulatory Visit: Payer: Self-pay

## 2024-12-29 DIAGNOSIS — N182 Chronic kidney disease, stage 2 (mild): Secondary | ICD-10-CM | POA: Diagnosis present

## 2024-12-29 DIAGNOSIS — Z880 Allergy status to penicillin: Secondary | ICD-10-CM

## 2024-12-29 DIAGNOSIS — I129 Hypertensive chronic kidney disease with stage 1 through stage 4 chronic kidney disease, or unspecified chronic kidney disease: Secondary | ICD-10-CM | POA: Diagnosis not present

## 2024-12-29 DIAGNOSIS — Z79899 Other long term (current) drug therapy: Secondary | ICD-10-CM | POA: Diagnosis not present

## 2024-12-29 DIAGNOSIS — W010XXA Fall on same level from slipping, tripping and stumbling without subsequent striking against object, initial encounter: Secondary | ICD-10-CM | POA: Diagnosis present

## 2024-12-29 DIAGNOSIS — Z96651 Presence of right artificial knee joint: Secondary | ICD-10-CM | POA: Diagnosis present

## 2024-12-29 DIAGNOSIS — Z7901 Long term (current) use of anticoagulants: Secondary | ICD-10-CM | POA: Diagnosis not present

## 2024-12-29 DIAGNOSIS — D62 Acute posthemorrhagic anemia: Secondary | ICD-10-CM | POA: Diagnosis not present

## 2024-12-29 DIAGNOSIS — S42202A Unspecified fracture of upper end of left humerus, initial encounter for closed fracture: Secondary | ICD-10-CM | POA: Diagnosis present

## 2024-12-29 DIAGNOSIS — I340 Nonrheumatic mitral (valve) insufficiency: Secondary | ICD-10-CM | POA: Diagnosis present

## 2024-12-29 DIAGNOSIS — K219 Gastro-esophageal reflux disease without esophagitis: Secondary | ICD-10-CM | POA: Diagnosis present

## 2024-12-29 DIAGNOSIS — K5900 Constipation, unspecified: Secondary | ICD-10-CM | POA: Diagnosis present

## 2024-12-29 DIAGNOSIS — Z885 Allergy status to narcotic agent status: Secondary | ICD-10-CM

## 2024-12-29 DIAGNOSIS — E7800 Pure hypercholesterolemia, unspecified: Secondary | ICD-10-CM | POA: Diagnosis present

## 2024-12-29 DIAGNOSIS — S72142A Displaced intertrochanteric fracture of left femur, initial encounter for closed fracture: Secondary | ICD-10-CM

## 2024-12-29 DIAGNOSIS — I4891 Unspecified atrial fibrillation: Secondary | ICD-10-CM | POA: Diagnosis not present

## 2024-12-29 DIAGNOSIS — S72002A Fracture of unspecified part of neck of left femur, initial encounter for closed fracture: Secondary | ICD-10-CM | POA: Diagnosis present

## 2024-12-29 DIAGNOSIS — Z888 Allergy status to other drugs, medicaments and biological substances status: Secondary | ICD-10-CM | POA: Diagnosis not present

## 2024-12-29 DIAGNOSIS — I482 Chronic atrial fibrillation, unspecified: Secondary | ICD-10-CM | POA: Diagnosis present

## 2024-12-29 DIAGNOSIS — N1832 Chronic kidney disease, stage 3b: Secondary | ICD-10-CM

## 2024-12-29 DIAGNOSIS — W19XXXA Unspecified fall, initial encounter: Principal | ICD-10-CM

## 2024-12-29 DIAGNOSIS — Z8249 Family history of ischemic heart disease and other diseases of the circulatory system: Secondary | ICD-10-CM

## 2024-12-29 DIAGNOSIS — Z91040 Latex allergy status: Secondary | ICD-10-CM

## 2024-12-29 DIAGNOSIS — Z66 Do not resuscitate: Secondary | ICD-10-CM | POA: Diagnosis present

## 2024-12-29 DIAGNOSIS — M810 Age-related osteoporosis without current pathological fracture: Secondary | ICD-10-CM | POA: Diagnosis present

## 2024-12-29 DIAGNOSIS — Y9301 Activity, walking, marching and hiking: Secondary | ICD-10-CM | POA: Diagnosis present

## 2024-12-29 HISTORY — PX: INTRAMEDULLARY (IM) NAIL INTERTROCHANTERIC: SHX5875

## 2024-12-29 LAB — CBC WITH DIFFERENTIAL/PLATELET
Abs Immature Granulocytes: 0.04 K/uL (ref 0.00–0.07)
Basophils Absolute: 0 K/uL (ref 0.0–0.1)
Basophils Relative: 0 %
Eosinophils Absolute: 0 K/uL (ref 0.0–0.5)
Eosinophils Relative: 0 %
HCT: 37.3 % (ref 36.0–46.0)
Hemoglobin: 11.9 g/dL — ABNORMAL LOW (ref 12.0–15.0)
Immature Granulocytes: 0 %
Lymphocytes Relative: 5 %
Lymphs Abs: 0.4 K/uL — ABNORMAL LOW (ref 0.7–4.0)
MCH: 31.4 pg (ref 26.0–34.0)
MCHC: 31.9 g/dL (ref 30.0–36.0)
MCV: 98.4 fL (ref 80.0–100.0)
Monocytes Absolute: 0.6 K/uL (ref 0.1–1.0)
Monocytes Relative: 7 %
Neutro Abs: 8.3 K/uL — ABNORMAL HIGH (ref 1.7–7.7)
Neutrophils Relative %: 88 %
Platelets: 137 K/uL — ABNORMAL LOW (ref 150–400)
RBC: 3.79 MIL/uL — ABNORMAL LOW (ref 3.87–5.11)
RDW: 12.3 % (ref 11.5–15.5)
WBC: 9.4 K/uL (ref 4.0–10.5)
nRBC: 0 % (ref 0.0–0.2)

## 2024-12-29 LAB — I-STAT CHEM 8, ED
BUN: 32 mg/dL — ABNORMAL HIGH (ref 8–23)
Calcium, Ion: 1.18 mmol/L (ref 1.15–1.40)
Chloride: 103 mmol/L (ref 98–111)
Creatinine, Ser: 0.9 mg/dL (ref 0.44–1.00)
Glucose, Bld: 120 mg/dL — ABNORMAL HIGH (ref 70–99)
HCT: 36 % (ref 36.0–46.0)
Hemoglobin: 12.2 g/dL (ref 12.0–15.0)
Potassium: 4.1 mmol/L (ref 3.5–5.1)
Sodium: 142 mmol/L (ref 135–145)
TCO2: 27 mmol/L (ref 22–32)

## 2024-12-29 LAB — BASIC METABOLIC PANEL WITH GFR
Anion gap: 9 (ref 5–15)
BUN: 30 mg/dL — ABNORMAL HIGH (ref 8–23)
CO2: 27 mmol/L (ref 22–32)
Calcium: 9.3 mg/dL (ref 8.9–10.3)
Chloride: 105 mmol/L (ref 98–111)
Creatinine, Ser: 0.87 mg/dL (ref 0.44–1.00)
GFR, Estimated: 60 mL/min — ABNORMAL LOW
Glucose, Bld: 123 mg/dL — ABNORMAL HIGH (ref 70–99)
Potassium: 4.2 mmol/L (ref 3.5–5.1)
Sodium: 142 mmol/L (ref 135–145)

## 2024-12-29 LAB — SURGICAL PCR SCREEN
MRSA, PCR: NEGATIVE
Staphylococcus aureus: NEGATIVE

## 2024-12-29 LAB — VITAMIN D 25 HYDROXY (VIT D DEFICIENCY, FRACTURES): Vit D, 25-Hydroxy: 41.6 ng/mL (ref 30–100)

## 2024-12-29 LAB — TYPE AND SCREEN
ABO/RH(D): B POS
Antibody Screen: NEGATIVE

## 2024-12-29 LAB — PROTIME-INR
INR: 1.2 (ref 0.8–1.2)
Prothrombin Time: 15.9 s — ABNORMAL HIGH (ref 11.4–15.2)

## 2024-12-29 MED ORDER — TRANEXAMIC ACID-NACL 1000-0.7 MG/100ML-% IV SOLN
1000.0000 mg | INTRAVENOUS | Status: AC
  Administered 2024-12-29: 1000 mg via INTRAVENOUS

## 2024-12-29 MED ORDER — FENTANYL CITRATE (PF) 50 MCG/ML IJ SOSY
25.0000 ug | PREFILLED_SYRINGE | INTRAMUSCULAR | Status: DC | PRN
  Administered 2024-12-29 – 2024-12-30 (×2): 25 ug via INTRAVENOUS
  Filled 2024-12-29 (×2): qty 1

## 2024-12-29 MED ORDER — OXYCODONE HCL 5 MG/5ML PO SOLN: 5.0000 mg | Freq: Once | ORAL | Status: DC | PRN

## 2024-12-29 MED ORDER — ACETAMINOPHEN 325 MG PO TABS
650.0000 mg | ORAL_TABLET | Freq: Four times a day (QID) | ORAL | Status: DC | PRN
  Administered 2024-12-30: 650 mg via ORAL
  Filled 2024-12-29: qty 2

## 2024-12-29 MED ORDER — ONDANSETRON HCL 4 MG/2ML IJ SOLN: 4.0000 mg | Freq: Once | INTRAMUSCULAR | Status: DC | PRN

## 2024-12-29 MED ORDER — DEXAMETHASONE SOD PHOSPHATE PF 10 MG/ML IJ SOLN
INTRAMUSCULAR | Status: DC | PRN
  Administered 2024-12-29: 5 mg via INTRAVENOUS

## 2024-12-29 MED ORDER — ONDANSETRON HCL 4 MG/2ML IJ SOLN
4.0000 mg | Freq: Once | INTRAMUSCULAR | Status: AC
  Administered 2024-12-29: 4 mg via INTRAVENOUS
  Filled 2024-12-29: qty 2

## 2024-12-29 MED ORDER — LIDOCAINE 2% (20 MG/ML) 5 ML SYRINGE
INTRAMUSCULAR | Status: AC
  Filled 2024-12-29: qty 5

## 2024-12-29 MED ORDER — SUGAMMADEX SODIUM 200 MG/2ML IV SOLN
INTRAVENOUS | Status: DC | PRN
  Administered 2024-12-29: 200 mg via INTRAVENOUS

## 2024-12-29 MED ORDER — CHLORHEXIDINE GLUCONATE 4 % EX SOLN
60.0000 mL | Freq: Once | CUTANEOUS | Status: AC
  Administered 2024-12-29: 4 via TOPICAL

## 2024-12-29 MED ORDER — ONDANSETRON HCL 4 MG/2ML IJ SOLN
INTRAMUSCULAR | Status: DC | PRN
  Administered 2024-12-29: 4 mg via INTRAVENOUS

## 2024-12-29 MED ORDER — FENTANYL CITRATE (PF) 100 MCG/2ML IJ SOLN
INTRAMUSCULAR | Status: DC | PRN
  Administered 2024-12-29: 25 ug via INTRAVENOUS

## 2024-12-29 MED ORDER — VITAMIN C 500 MG PO TABS
500.0000 mg | ORAL_TABLET | Freq: Every day | ORAL | Status: DC
  Administered 2024-12-30 – 2025-01-03 (×5): 500 mg via ORAL
  Filled 2024-12-29 (×5): qty 1

## 2024-12-29 MED ORDER — EPHEDRINE SULFATE (PRESSORS) 25 MG/5ML IV SOSY
PREFILLED_SYRINGE | INTRAVENOUS | Status: DC | PRN
  Administered 2024-12-29: 10 mg via INTRAVENOUS

## 2024-12-29 MED ORDER — SODIUM CHLORIDE 0.9 % IV SOLN: INTRAVENOUS | Status: DC

## 2024-12-29 MED ORDER — TRANEXAMIC ACID-NACL 1000-0.7 MG/100ML-% IV SOLN
1000.0000 mg | Freq: Once | INTRAVENOUS | Status: AC
  Administered 2024-12-29: 1000 mg via INTRAVENOUS
  Filled 2024-12-29: qty 100

## 2024-12-29 MED ORDER — SODIUM CHLORIDE 0.9 % IV SOLN: 250.0000 mL | INTRAVENOUS | Status: DC | PRN

## 2024-12-29 MED ORDER — DOCUSATE SODIUM 100 MG PO CAPS
100.0000 mg | ORAL_CAPSULE | Freq: Two times a day (BID) | ORAL | Status: DC
  Administered 2024-12-29: 100 mg via ORAL
  Filled 2024-12-29: qty 1

## 2024-12-29 MED ORDER — ONDANSETRON HCL 4 MG/2ML IJ SOLN
4.0000 mg | Freq: Four times a day (QID) | INTRAMUSCULAR | Status: DC | PRN
  Administered 2024-12-29 – 2024-12-30 (×2): 4 mg via INTRAVENOUS
  Filled 2024-12-29 (×2): qty 2

## 2024-12-29 MED ORDER — ONDANSETRON HCL 4 MG PO TABS: 4.0000 mg | ORAL_TABLET | Freq: Four times a day (QID) | ORAL | Status: DC | PRN

## 2024-12-29 MED ORDER — LACTATED RINGERS IV SOLN: INTRAVENOUS | Status: DC | PRN

## 2024-12-29 MED ORDER — TRANEXAMIC ACID-NACL 1000-0.7 MG/100ML-% IV SOLN
INTRAVENOUS | Status: AC
  Filled 2024-12-29: qty 100

## 2024-12-29 MED ORDER — FENTANYL CITRATE (PF) 50 MCG/ML IJ SOSY
50.0000 ug | PREFILLED_SYRINGE | INTRAMUSCULAR | Status: AC | PRN
  Administered 2024-12-29 (×2): 50 ug via INTRAVENOUS
  Filled 2024-12-29 (×2): qty 1

## 2024-12-29 MED ORDER — FENTANYL CITRATE (PF) 100 MCG/2ML IJ SOLN: 25.0000 ug | INTRAMUSCULAR | Status: DC | PRN

## 2024-12-29 MED ORDER — 0.9 % SODIUM CHLORIDE (POUR BTL) OPTIME
TOPICAL | Status: DC | PRN
  Administered 2024-12-29: 1000 mL

## 2024-12-29 MED ORDER — METOCLOPRAMIDE HCL 5 MG/ML IJ SOLN: 5.0000 mg | Freq: Three times a day (TID) | INTRAMUSCULAR | Status: DC | PRN

## 2024-12-29 MED ORDER — PROPOFOL 10 MG/ML IV BOLUS
INTRAVENOUS | Status: DC | PRN
  Administered 2024-12-29: 60 mg via INTRAVENOUS

## 2024-12-29 MED ORDER — PHENYLEPHRINE 80 MCG/ML (10ML) SYRINGE FOR IV PUSH (FOR BLOOD PRESSURE SUPPORT)
PREFILLED_SYRINGE | INTRAVENOUS | Status: DC | PRN
  Administered 2024-12-29 (×2): 80 ug via INTRAVENOUS
  Administered 2024-12-29 (×2): 160 ug via INTRAVENOUS
  Administered 2024-12-29: 80 ug via INTRAVENOUS

## 2024-12-29 MED ORDER — SODIUM CHLORIDE 0.9% FLUSH
3.0000 mL | Freq: Two times a day (BID) | INTRAVENOUS | Status: DC
  Administered 2024-12-29 (×2): 3 mL via INTRAVENOUS

## 2024-12-29 MED ORDER — IOHEXOL 350 MG/ML SOLN
75.0000 mL | Freq: Once | INTRAVENOUS | Status: AC | PRN
  Administered 2024-12-29: 75 mL via INTRAVENOUS

## 2024-12-29 MED ORDER — ENOXAPARIN SODIUM 40 MG/0.4ML IJ SOSY: 40.0000 mg | PREFILLED_SYRINGE | INTRAMUSCULAR | Status: DC

## 2024-12-29 MED ORDER — FENTANYL CITRATE (PF) 100 MCG/2ML IJ SOLN
INTRAMUSCULAR | Status: AC
  Filled 2024-12-29: qty 2

## 2024-12-29 MED ORDER — ACETAMINOPHEN 650 MG RE SUPP: 650.0000 mg | Freq: Four times a day (QID) | RECTAL | Status: DC | PRN

## 2024-12-29 MED ORDER — SODIUM CHLORIDE 0.9% FLUSH: 3.0000 mL | INTRAVENOUS | Status: DC | PRN

## 2024-12-29 MED ORDER — CHLORHEXIDINE GLUCONATE 0.12 % MT SOLN
OROMUCOSAL | Status: AC
  Administered 2024-12-29: 15 mL
  Filled 2024-12-29: qty 15

## 2024-12-29 MED ORDER — POLYETHYLENE GLYCOL 3350 17 G PO PACK: 17.0000 g | PACK | Freq: Every day | ORAL | Status: DC | PRN

## 2024-12-29 MED ORDER — PHENYLEPHRINE HCL-NACL 20-0.9 MG/250ML-% IV SOLN
INTRAVENOUS | Status: DC | PRN
  Administered 2024-12-29: 20 ug/min via INTRAVENOUS

## 2024-12-29 MED ORDER — METOCLOPRAMIDE HCL 5 MG PO TABS: 5.0000 mg | ORAL_TABLET | Freq: Three times a day (TID) | ORAL | Status: DC | PRN

## 2024-12-29 MED ORDER — ALBUMIN HUMAN 5 % IV SOLN: INTRAVENOUS | Status: DC | PRN

## 2024-12-29 MED ORDER — LIDOCAINE HCL (CARDIAC) PF 100 MG/5ML IV SOSY: PREFILLED_SYRINGE | INTRAVENOUS | Status: DC | PRN

## 2024-12-29 MED ORDER — ROCURONIUM BROMIDE 10 MG/ML (PF) SYRINGE
PREFILLED_SYRINGE | INTRAVENOUS | Status: AC
  Filled 2024-12-29: qty 10

## 2024-12-29 MED ORDER — OXYCODONE HCL 5 MG PO TABS: 5.0000 mg | ORAL_TABLET | Freq: Once | ORAL | Status: DC | PRN

## 2024-12-29 MED ORDER — MEPERIDINE HCL 25 MG/ML IJ SOLN: 6.2500 mg | INTRAMUSCULAR | Status: DC | PRN

## 2024-12-29 MED ORDER — CEFAZOLIN SODIUM-DEXTROSE 2-4 GM/100ML-% IV SOLN
2.0000 g | Freq: Three times a day (TID) | INTRAVENOUS | Status: AC
  Administered 2024-12-29 – 2024-12-30 (×3): 2 g via INTRAVENOUS
  Filled 2024-12-29 (×3): qty 100

## 2024-12-29 MED ORDER — POVIDONE-IODINE 10 % EX SWAB: 2.0000 | Freq: Once | CUTANEOUS | Status: DC

## 2024-12-29 MED ORDER — HYDROCODONE-ACETAMINOPHEN 5-325 MG PO TABS
1.0000 | ORAL_TABLET | ORAL | Status: DC | PRN
  Administered 2024-12-29: 1 via ORAL
  Filled 2024-12-29: qty 1

## 2024-12-29 MED ORDER — CEFAZOLIN SODIUM-DEXTROSE 2-4 GM/100ML-% IV SOLN
2.0000 g | INTRAVENOUS | Status: AC
  Administered 2024-12-29: 2 g via INTRAVENOUS

## 2024-12-29 MED ORDER — ONDANSETRON HCL 4 MG/2ML IJ SOLN
INTRAMUSCULAR | Status: AC
  Filled 2024-12-29: qty 2

## 2024-12-29 MED ORDER — VANCOMYCIN HCL 1000 MG IV SOLR
INTRAVENOUS | Status: DC | PRN
  Administered 2024-12-29: 1000 mg via TOPICAL

## 2024-12-29 MED ORDER — ROCURONIUM BROMIDE 100 MG/10ML IV SOLN
INTRAVENOUS | Status: DC | PRN
  Administered 2024-12-29: 40 mg via INTRAVENOUS

## 2024-12-29 MED ORDER — PROPOFOL 10 MG/ML IV BOLUS
INTRAVENOUS | Status: AC
  Filled 2024-12-29: qty 20

## 2024-12-29 MED ORDER — METOPROLOL SUCCINATE ER 25 MG PO TB24
37.5000 mg | ORAL_TABLET | Freq: Every day | ORAL | Status: DC
  Filled 2024-12-29 (×2): qty 2

## 2024-12-29 MED ORDER — HYDRALAZINE HCL 20 MG/ML IJ SOLN: 5.0000 mg | Freq: Four times a day (QID) | INTRAMUSCULAR | Status: DC | PRN

## 2024-12-29 MED ORDER — CEFAZOLIN SODIUM-DEXTROSE 2-4 GM/100ML-% IV SOLN
INTRAVENOUS | Status: AC
  Filled 2024-12-29: qty 100

## 2024-12-29 MED ORDER — VANCOMYCIN HCL 1000 MG IV SOLR
INTRAVENOUS | Status: AC
  Filled 2024-12-29: qty 20

## 2024-12-29 MED ORDER — LIDOCAINE 2% (20 MG/ML) 5 ML SYRINGE
INTRAMUSCULAR | Status: DC | PRN
  Administered 2024-12-29: 50 mg via INTRAVENOUS

## 2024-12-29 MED ORDER — DEXAMETHASONE SOD PHOSPHATE PF 10 MG/ML IJ SOLN
INTRAMUSCULAR | Status: AC
  Filled 2024-12-29: qty 1

## 2024-12-29 NOTE — ED Notes (Signed)
Report given to short stay nurse. 

## 2024-12-29 NOTE — Anesthesia Procedure Notes (Signed)
 Procedure Name: Intubation Date/Time: 12/29/2024 2:46 PM  Performed by: Deeann Eva BROCKS, CRNAPre-anesthesia Checklist: Patient identified, Emergency Drugs available, Suction available and Patient being monitored Patient Re-evaluated:Patient Re-evaluated prior to induction Oxygen Delivery Method: Circle System Utilized Preoxygenation: Pre-oxygenation with 100% oxygen Induction Type: IV induction Ventilation: Mask ventilation without difficulty Laryngoscope Size: Mac and 3 Grade View: Grade I Tube type: Oral Tube size: 7.0 mm Number of attempts: 1 Airway Equipment and Method: Stylet and Oral airway Placement Confirmation: ETT inserted through vocal cords under direct vision, positive ETCO2 and breath sounds checked- equal and bilateral Secured at: 20 cm Tube secured with: Tape Dental Injury: Teeth and Oropharynx as per pre-operative assessment

## 2024-12-29 NOTE — Transfer of Care (Signed)
 Immediate Anesthesia Transfer of Care Note  Patient: Jessica Berg  Procedure(s) Performed: FIXATION, FRACTURE, INTERTROCHANTERIC, WITH INTRAMEDULLARY ROD (Left: Hip)  Patient Location: PACU  Anesthesia Type:General  Level of Consciousness: drowsy  Airway & Oxygen Therapy: Patient Spontanous Breathing and Patient connected to face mask oxygen  Post-op Assessment: Report given to RN and Post -op Vital signs reviewed and stable  Post vital signs: Reviewed and stable  Last Vitals:  Vitals Value Taken Time  BP 145/73 12/29/24 15:39  Temp    Pulse 87 12/29/24 15:44  Resp 25 12/29/24 15:44  SpO2 99 % 12/29/24 15:44  Vitals shown include unfiled device data.  Last Pain:  Vitals:   12/29/24 1349  TempSrc: Oral  PainSc:          Complications: No notable events documented.

## 2024-12-29 NOTE — ED Triage Notes (Signed)
 Pt coming in from home. Pt had a mechanical fall. Pt is on elequis. Pt denies hitting head. Fell on left shoulder and left hip. Pt has shortening and rotation of left hip. Pt complaining of shoulder pain and hip pain. Pt given 50mcg of fentinyl by ems.   Ems vitals 150/86  95% ra  Afib hx. Hr between 76-80  Cbg 124

## 2024-12-29 NOTE — Anesthesia Preprocedure Evaluation (Addendum)
"                                    Anesthesia Evaluation  Patient identified by MRN, date of birth, ID band Patient awake    Reviewed: Allergy & Precautions, H&P , NPO status , Patient's Chart, lab work & pertinent test results  Airway Mallampati: II  TM Distance: >3 FB Neck ROM: Full    Dental no notable dental hx. (+) Poor Dentition, Chipped, Missing, Dental Advisory Given   Pulmonary neg pulmonary ROS   Pulmonary exam normal breath sounds clear to auscultation       Cardiovascular Exercise Tolerance: Good hypertension, Pt. on home beta blockers negative cardio ROS Normal cardiovascular exam+ dysrhythmias Atrial Fibrillation  Rhythm:Regular Rate:Normal  ECHO 24    1. Left ventricular ejection fraction, by estimation, is 60 to 65%. The  left ventricle has normal function. The left ventricle has no regional  wall motion abnormalities. There is moderate asymmetric left ventricular  hypertrophy of the septal segment.  Left ventricular diastolic function could not be evaluated.   2. Right ventricular systolic function is mildly reduced. The right  ventricular size is normal. There is mildly elevated pulmonary artery  systolic pressure.   3. Left atrial size was severely dilated.   4. Right atrial size was moderately dilated.   5. The mitral valve is normal in structure. Trivial mitral valve  regurgitation. No evidence of mitral stenosis. Moderate mitral annular  calcification.   6. Tricuspid valve regurgitation is moderate.   7. The aortic valve is tricuspid. There is mild calcification of the  aortic valve. There is mild thickening of the aortic valve. Aortic valve  regurgitation is mild. Aortic valve sclerosis/calcification is present,  without any evidence of aortic  stenosis.     Neuro/Psych negative neurological ROS  negative psych ROS   GI/Hepatic negative GI ROS, Neg liver ROS,GERD  ,,  Endo/Other  negative endocrine ROS    Renal/GU CRFRenal  diseasenegative Renal ROS  negative genitourinary   Musculoskeletal negative musculoskeletal ROS (+) Arthritis ,    Abdominal   Peds negative pediatric ROS (+)  Hematology negative hematology ROS (+)   Anesthesia Other Findings   Reproductive/Obstetrics negative OB ROS                              Anesthesia Physical Anesthesia Plan  ASA: 4 and emergent  Anesthesia Plan: General   Post-op Pain Management: Minimal or no pain anticipated and Ofirmev  IV (intra-op)*   Induction:   PONV Risk Score and Plan: 3 and Ondansetron  and Dexamethasone   Airway Management Planned: Oral ETT and LMA  Additional Equipment: None  Intra-op Plan:   Post-operative Plan: Extubation in OR and Possible Post-op intubation/ventilation  Informed Consent: I have reviewed the patients History and Physical, chart, labs and discussed the procedure including the risks, benefits and alternatives for the proposed anesthesia with the patient or authorized representative who has indicated his/her understanding and acceptance.    Discussed DNR with power of attorney.     Plan Discussed with: Anesthesiologist and CRNA  Anesthesia Plan Comments: (Long discussion with both daughters who agree with NO CHEST compressions but ok to do meds and ventilate.)         Anesthesia Quick Evaluation  "

## 2024-12-29 NOTE — H&P (View-Only) (Signed)
 Reason for Consult:Polytrauma Referring Physician: Ozell Arts Time called: [id number] Time at bedside: 0927   Jessica Berg is an 89 y.o. female.  HPI: Jessica Berg lost her balance and fell earlier this AM. She had immediate left shoulder and hip pain and could not get up. She was brought to the ED where x-rays showed a left proximal humerus fx and left hip fx and orthopedic surgery was consulted. She lives at home alone and uses a RW for ambulation.  Past Medical History:  Diagnosis Date   Atrial fibrillation (HCC)    Chronic renal disease, stage II    GERD (gastroesophageal reflux disease)    Hypercholesteremia    MR (mitral regurgitation)    , Mild   Osteoarthritis    Osteoporosis     Past Surgical History:  Procedure Laterality Date   BACK SURGERY     JOINT REPLACEMENT      Family History  Problem Relation Age of Onset   Arthritis Mother    Hypothyroidism Mother    Heart attack Father     Social History:  reports that she has never smoked. She has never used smokeless tobacco. She reports that she does not drink alcohol and does not use drugs.  Allergies: Allergies[1]  Medications: I have reviewed the patient's current medications.  Results for orders placed or performed during the hospital encounter of 12/29/24 (from the past 48 hours)  Basic metabolic panel     Status: Abnormal   Collection Time: 12/29/24  8:30 AM  Result Value Ref Range   Sodium 142 135 - 145 mmol/L   Potassium 4.2 3.5 - 5.1 mmol/L   Chloride 105 98 - 111 mmol/L   CO2 27 22 - 32 mmol/L   Glucose, Bld 123 (H) 70 - 99 mg/dL    Comment: Glucose reference range applies only to samples taken after fasting for at least 8 hours.   BUN 30 (H) 8 - 23 mg/dL   Creatinine, Ser 9.12 0.44 - 1.00 mg/dL   Calcium  9.3 8.9 - 10.3 mg/dL   GFR, Estimated 60 (L) >60 mL/min    Comment: (NOTE) Calculated using the CKD-EPI Creatinine Equation (2021)    Anion gap 9 5 - 15    Comment: Performed at [facility], [address] [place], [place] [address]  CBC with Differential     Status: Abnormal   Collection Time: 12/29/24  8:30 AM  Result Value Ref Range   WBC 9.4 4.0 - 10.5 K/uL   RBC 3.79 (L) 3.87 - 5.11 MIL/uL   Hemoglobin 11.9 (L) 12.0 - 15.0 g/dL   HCT 62.6 63.9 - 53.9 %   MCV 98.4 80.0 - 100.0 fL   MCH 31.4 26.0 - 34.0 pg   MCHC 31.9 30.0 - 36.0 g/dL   RDW 87.6 88.4 - 84.4 %   Platelets 137 (L) 150 - 400 K/uL   nRBC 0.0 0.0 - 0.2 %   Neutrophils Relative % 88 %   Neutro Abs 8.3 (H) 1.7 - 7.7 K/uL   Lymphocytes Relative 5 %   Lymphs Abs 0.4 (L) 0.7 - 4.0 K/uL   Monocytes Relative 7 %   Monocytes Absolute 0.6 0.1 - 1.0 K/uL   Eosinophils Relative 0 %   Eosinophils Absolute 0.0 0.0 - 0.5 K/uL   Basophils Relative 0 %   Basophils Absolute 0.0 0.0 - 0.1 K/uL   Immature Granulocytes 0 %   Abs Immature Granulocytes 0.04 0.00 - 0.07 K/uL  Comment: Performed at [facility], [address], [place], [place] [address]  Protime-INR     Status: Abnormal   Collection Time: 12/29/24  8:30 AM  Result Value Ref Range   Prothrombin Time 15.9 (H) 11.4 - 15.2 seconds   INR 1.2 0.8 - 1.2    Comment: (NOTE) INR goal varies based on device and disease states. Performed at [facility], [address] [place], [place] [address]   Type and screen [facility]     Status: None (Preliminary result)   Collection Time: 12/29/24  8:30 AM  Result Value Ref Range   ABO/RH(D) PENDING    Antibody Screen PENDING    Sample Expiration      01/01/2025,2359 Performed at [facility], [address], [place], [place] [address]   I-stat chem 8, ED     Status: Abnormal   Collection Time: 12/29/24  8:39 AM  Result Value Ref Range   Sodium 142 135 - 145 mmol/L   Potassium 4.1 3.5 - 5.1 mmol/L   Chloride 103 98 - 111 mmol/L   BUN 32 (H) 8 - 23 mg/dL   Creatinine, Ser 9.09 0.44 - 1.00 mg/dL   Glucose, Bld 879 (H) 70 - 99 mg/dL    Comment: Glucose reference  range applies only to samples taken after fasting for at least 8 hours.   Calcium , Ion 1.18 1.15 - 1.40 mmol/L   TCO2 27 22 - 32 mmol/L   Hemoglobin 12.2 12.0 - 15.0 g/dL   HCT 63.9 63.9 - 53.9 %    DG Hip Unilat With Pelvis 2-3 Views Left Result Date: 12/29/2024 CLINICAL DATA:  LEFT shoulder and hip pain status post fall EXAM: DG HIP (WITH OR WITHOUT PELVIS) 2-3V LEFT COMPARISON:  None available FINDINGS: Acute, mildly angulated, moderately displaced LEFT intertrochanteric femur fracture. Lumbar fusion hardware partially visualized. Mild diffuse osteopenia. Mild degenerative changes of the RIGHT hip. IMPRESSION: Acute, mildly angulated, moderately displaced LEFT intertrochanteric femur fracture. Electronically Signed   By: Aliene Lloyd M.D.   On: 12/29/2024 08:32   DG Knee 1-2 Views Left Result Date: 12/29/2024 CLINICAL DATA:  LEFT shoulder and hip pain status post fall EXAM: LEFT KNEE - 1-2 VIEW COMPARISON:  None available FINDINGS: Moderate joint space loss and spurring and chondrocalcinosis of the medial and lateral compartments. Mild joint space loss and spurring of the patellofemoral compartment. No fracture or dislocation. IMPRESSION: 1. No acute abnormality of the LEFT knee. 2. Moderate tricompartmental osteoarthrosis of the LEFT knee. Electronically Signed   By: Aliene Lloyd M.D.   On: 12/29/2024 08:30   DG Knee 1-2 Views Right Result Date: 12/29/2024 CLINICAL DATA:  LEFT shoulder and hip pain status post fall EXAM: RIGHT KNEE - 1-2 VIEW COMPARISON:  None available FINDINGS: RIGHT total knee prosthesis is well seated without periprosthetic fracture or lucency. No acute fracture or dislocation. Diffuse osteopenia. IMPRESSION: Uncomplicated RIGHT total knee prosthesis. No acute abnormality of the RIGHT knee. Electronically Signed   By: Aliene Lloyd M.D.   On: 12/29/2024 08:29   DG Ankle 2 Views Right Result Date: 12/29/2024 CLINICAL DATA:  LEFT shoulder and hip pain status post fall EXAM:  RIGHT ANKLE - 2 VIEW COMPARISON:  None available FINDINGS: Enthesopathic changes are seen at the insertion of the plantar fascia and Achilles tendon on the calcaneus. Mild degenerative spurring seen at the tip of the lateral malleolus. No acute fracture or dislocation. Visualized soft tissues are unremarkable.  IMPRESSION: No acute radiographic abnormality of the RIGHT ankle. Electronically Signed   By: Aliene Lloyd M.D.   On: 12/29/2024 08:28   DG Chest 1 View Result Date: 12/29/2024 CLINICAL DATA:  LEFT shoulder and hip pain status post fall EXAM: CHEST  1 VIEW COMPARISON:  10/08/2023 FINDINGS: The heart is moderately enlarged which appears increased since the prior chest radiograph. No pulmonary vascular congestion. Focal opacity in the LEFT lung base could relate to pneumonia or atelectasis. Lungs are otherwise clear. Known LEFT proximal humerus fracture better visualized on dedicated shoulder radiographs. IMPRESSION: 1. Interval worsening of moderate cardiomegaly. 2. Focal opacity in the LEFT lung base may be due to atelectasis or pneumonia. 3. Known LEFT proximal humerus fracture better seen on dedicated shoulder radiographs. Electronically Signed   By: Aliene Lloyd M.D.   On: 12/29/2024 08:26   DG Shoulder Left Result Date: 12/29/2024 CLINICAL DATA:  LEFT shoulder and hip pain status post fall EXAM: LEFT SHOULDER - 2+ VIEW COMPARISON:  None available FINDINGS: Mildly impacted and displaced fracture of the LEFT proximal humerus. Mild elevation of the clavicle relative the acromion measuring 9 mm, suspicious for [facility] joint separation. Diffuse osteopenia. Calcifications adjacent to the greater tuberosity consistent with calcific rotator cuff tendinosis. IMPRESSION: 1. Mildly impacted and displaced LEFT proximal humerus fracture. 2. Elevated LEFT clavicle relative the acromion, suspicious for [facility] joint separation. Electronically Signed   By: Aliene Lloyd M.D.   On: 12/29/2024 08:24    Review of Systems   HENT:  Negative for ear discharge, ear pain, hearing loss and tinnitus.   Eyes:  Negative for photophobia and pain.  Respiratory:  Negative for cough and shortness of breath.   Cardiovascular:  Negative for chest pain.  Gastrointestinal:  Negative for abdominal pain, nausea and vomiting.  Genitourinary:  Negative for dysuria, flank pain, frequency and urgency.  Musculoskeletal:  Positive for arthralgias (Left shoulder and left hip). Negative for back pain, myalgias and neck pain.  Neurological:  Negative for dizziness and headaches.  Hematological:  Does not bruise/bleed easily.  Psychiatric/Behavioral:  The patient is not nervous/anxious.    Blood pressure (!) 142/78, pulse 77, temperature (!) 97.4 F (36.3 C), temperature source Oral, resp. rate (!) 31, height 5' (1.524 m), weight 49.9 kg, SpO2 100%. Physical Exam Constitutional:      General: She is not in acute distress.    Appearance: She is well-developed. She is not diaphoretic.  HENT:     Head: Normocephalic and atraumatic.  Eyes:     General: No scleral icterus.       Right eye: No discharge.        Left eye: No discharge.     Conjunctiva/sclera: Conjunctivae normal.  Cardiovascular:     Rate and Rhythm: Normal rate and regular rhythm.  Pulmonary:     Effort: Pulmonary effort is normal. No respiratory distress.  Musculoskeletal:     Cervical back: Normal range of motion.     Comments: Left shoulder, elbow, wrist, digits- no skin wounds, shoulder mod TTP, mod edema, no instability, no blocks to motion  Sens  Ax/R/M/U intact  Mot   Ax/ R/ PIN/ M/ AIN/ U intact  Rad 2+  LLE No traumatic wounds, ecchymosis, or rash  Mod TTP hip  No knee or ankle effusion  Knee stable to varus/ valgus and anterior/posterior stress  Sens DPN, SPN, TN intact  Motor EHL, ext, flex, evers 5/5  DP 2+, PT 2+, No significant edema  Skin:  General: Skin is warm and dry.  Neurological:     Mental Status: She is alert.  Psychiatric:         Mood and Affect: Mood normal.        Behavior: Behavior normal.     Assessment/Plan: Left hip fx -- Plan IMN, possibly today if medicine clears in time with Dr. Kendal. Left humerus fx -- Plan non-operative management with sling and NWB.    Ozell DOROTHA Ned, PA-C Orthopedic Surgery [phone] 12/29/2024, 9:34 AM      [1]  Allergies Allergen Reactions   Alendronate     Other Reaction(s): Unknown   Calcitonin Nausea And Vomiting   Gabapentin     Other Reaction(s): blurred vision   Hydrocodone -Acetaminophen      Other Reaction(s): Dizzy   Latex     Other Reaction(s): Unknown   Meperidine  Hcl Nausea And Vomiting   Oxycodone -Acetaminophen      Other Reaction(s): Unknown   Pravastatin     Other Reaction(s): Unknown   Raloxifene     Other Reaction(s): Unknown   Risedronate     Other Reaction(s): Unknown   Tramadol  Hcl     Other Reaction(s): does not feel right   Darvocet [Propoxyphene N-Acetaminophen ] Nausea And Vomiting   Demerol  Hives   Etodolac Hives and Rash    Unknown to patient   Hydrocodone  Itching   Meprozine [Meperidine -Promethazine]     Reaction=tongue swelling   Penicillins Rash   Percocet [Oxycodone -Acetaminophen ] Nausea And Vomiting    Dizziness    Ultracet [Tramadol -Acetaminophen ] Other (See Comments)    headache

## 2024-12-29 NOTE — Hospital Course (Addendum)
 Jessica Berg

## 2024-12-29 NOTE — Progress Notes (Signed)
 Orthopedic Tech Progress Note Patient Details:  Jessica Berg September 24, 1924 [id number]  Ortho Devices Type of Ortho Device: Shoulder immobilizer Ortho Device/Splint Location: LUE Ortho Device/Splint Interventions: Ordered, Application, Adjustment   Post Interventions Patient Tolerated: Fair Instructions Provided: Adjustment of device, Care of device  [name] F Yachet Mattson 12/29/2024, 12:10 PM

## 2024-12-29 NOTE — Op Note (Signed)
 Orthopaedic Surgery Operative Note (CSN: [id number] ) Date of Surgery: 12/29/2024  Admit Date: 12/29/2024   Diagnoses: Pre-Op Diagnoses: Left intertrochanteric femur fracture   Post-Op Diagnosis: Same  Procedures: CPT 27245-Cephalomedullary nailing of left intertrochanteric femur fracture   Surgeons : Primary: Kendal [name], MD  Assistant: Lauraine Moores, PA-C  Location: OR 3   Anesthesia: General   Antibiotics: Ancef  2g preop    Tourniquet time: None    Estimated Blood Loss: 75 mL  Complications:* No complications entered in OR log *   Specimens:* No specimens in log *   Implants: Implant Name Type Inv. Item Serial No. Manufacturer Lot No. LRB No. Used Action  SCREW LAG COMPR KIT 95/90 - [id number] Screw SCREW LAG COMPR KIT 95/90  [employer] [id number] Left 1 Implanted  NAIL INTERTAN 10X18 130D 10S - [id number] Nail NAIL INTERTAN 10X18 130D 10S  [employer] [id number] Left 1 Implanted  SCREW TRIGEN LOW PROF 5.0X37.5 - [id number] Screw SCREW TRIGEN LOW PROF 5.0X37.5  [employer] [id number] Left 1 Implanted     Indications for Surgery: 89 year old female who sustained a ground-level fall with a left intertrochanteric femur fracture and left proximal humerus fracture.  Due to the unstable nature of her left hip I recommend proceeding with cephalomedullary nailing of the left hip.  Risks and benefits were discussed with the patient and her daughter.  Risks included but not limited to bleeding, infection, malunion, nonunion, hardware failure, hardware rotation, nerve and blood vessel injury, DVT, even the possibility anesthetic complications.  They agreed to proceed with surgery and consent was obtained.  Operative Findings: 1.  Cephalomedullary nailing of left intertrochanteric femur fracture using [employer] InterTAN 10 x 180 mm nail with a 95 mm lag screw and 90 mm compression screw.  Procedure: The patient was  identified in the preoperative holding area. Consent was confirmed with the patient and their family and all questions were answered. The operative extremity was marked after confirmation with the patient. she was then brought back to the operating room by our anesthesia colleagues.  She was placed under general anesthetic and carefully transferred over to a Hana table.  Fluoroscopic imaging showed the unstable nature of her injury.  Traction was applied and alignment was maintained.  The left hip was then prepped and draped in usual sterile fashion.  A timeout was performed to verify the patient, the procedure, and the extremity.  Preoperative antibiotics were dosed.  A small incision proximal to the greater trochanter was made and carried down through skin and subcutaneous tissue.  A threaded guidewire was directed at the tip of the greater trochanter and advanced into the proximal metaphysis.  I then used an entry reamer to enter the medullary canal.  I then passed a 10 x 180 mm nail down the center of the canal attached to the targeting arm.  Used the targeting arm to direct a threaded guidewire up into the head/neck segment.  I confirmed adequate tip apex distance using fluoroscopic imaging.  I then measured the length and decided to use a 95 mm lag screw.  I drilled the path for the compression screw and placed an antirotation bar.  I then drilled the path for the lag screw and placed the lag screw.  I then compressed with compression screw and got about 5 mm of compression.  I then statically locked the proximal portion of the nail.  I then used the targeting arm to place a  distal interlocking screw.  Final fluoroscopic imaging was obtained.  The incisions were irrigated and closed with 2-0 Monocryl and Dermabond.  Sterile dressings were applied.  The patient was then awoke from anesthesia and taken to the PACU in stable condition.   Post Op Plan/Instructions: The patient will be weightbearing as  tolerated to the left lower extremity.  Should be nonweightbearing to the left upper extremity.  She will be in a sling for comfort.  Will have her mobilize with physical and Occupational Therapy.  She will receive Lovenox  for DVT prophylaxis and discharged on aspirin 81 mg.  I was present and performed the entire surgery.  Lauraine Moores, PA-C did assist me throughout the case. An assistant was necessary given the difficulty in approach, maintenance of reduction and ability to instrument the fracture.   Franky Light, MD [facility]

## 2024-12-29 NOTE — Consult Note (Signed)
 Reason for Consult:Polytrauma Referring Physician: Ozell Arts Time called: [id number] Time at bedside: 0927   Jessica Berg is an 89 y.o. female.  HPI: Jessica Berg lost her balance and fell earlier this AM. She had immediate left shoulder and hip pain and could not get up. She was brought to the ED where x-rays showed a left proximal humerus fx and left hip fx and orthopedic surgery was consulted. She lives at home alone and uses a RW for ambulation.  Past Medical History:  Diagnosis Date   Atrial fibrillation (HCC)    Chronic renal disease, stage II    GERD (gastroesophageal reflux disease)    Hypercholesteremia    MR (mitral regurgitation)    , Mild   Osteoarthritis    Osteoporosis     Past Surgical History:  Procedure Laterality Date   BACK SURGERY     JOINT REPLACEMENT      Family History  Problem Relation Age of Onset   Arthritis Mother    Hypothyroidism Mother    Heart attack Father     Social History:  reports that she has never smoked. She has never used smokeless tobacco. She reports that she does not drink alcohol and does not use drugs.  Allergies: Allergies[1]  Medications: I have reviewed the patient's current medications.  Results for orders placed or performed during the hospital encounter of 12/29/24 (from the past 48 hours)  Basic metabolic panel     Status: Abnormal   Collection Time: 12/29/24  8:30 AM  Result Value Ref Range   Sodium 142 135 - 145 mmol/L   Potassium 4.2 3.5 - 5.1 mmol/L   Chloride 105 98 - 111 mmol/L   CO2 27 22 - 32 mmol/L   Glucose, Bld 123 (H) 70 - 99 mg/dL    Comment: Glucose reference range applies only to samples taken after fasting for at least 8 hours.   BUN 30 (H) 8 - 23 mg/dL   Creatinine, Ser 9.12 0.44 - 1.00 mg/dL   Calcium  9.3 8.9 - 10.3 mg/dL   GFR, Estimated 60 (L) >60 mL/min    Comment: (NOTE) Calculated using the CKD-EPI Creatinine Equation (2021)    Anion gap 9 5 - 15    Comment: Performed at [facility], [address] [place], [place] [address]  CBC with Differential     Status: Abnormal   Collection Time: 12/29/24  8:30 AM  Result Value Ref Range   WBC 9.4 4.0 - 10.5 K/uL   RBC 3.79 (L) 3.87 - 5.11 MIL/uL   Hemoglobin 11.9 (L) 12.0 - 15.0 g/dL   HCT 62.6 63.9 - 53.9 %   MCV 98.4 80.0 - 100.0 fL   MCH 31.4 26.0 - 34.0 pg   MCHC 31.9 30.0 - 36.0 g/dL   RDW 87.6 88.4 - 84.4 %   Platelets 137 (L) 150 - 400 K/uL   nRBC 0.0 0.0 - 0.2 %   Neutrophils Relative % 88 %   Neutro Abs 8.3 (H) 1.7 - 7.7 K/uL   Lymphocytes Relative 5 %   Lymphs Abs 0.4 (L) 0.7 - 4.0 K/uL   Monocytes Relative 7 %   Monocytes Absolute 0.6 0.1 - 1.0 K/uL   Eosinophils Relative 0 %   Eosinophils Absolute 0.0 0.0 - 0.5 K/uL   Basophils Relative 0 %   Basophils Absolute 0.0 0.0 - 0.1 K/uL   Immature Granulocytes 0 %   Abs Immature Granulocytes 0.04 0.00 - 0.07 K/uL  Comment: Performed at [facility], [address], [place], [place] [address]  Protime-INR     Status: Abnormal   Collection Time: 12/29/24  8:30 AM  Result Value Ref Range   Prothrombin Time 15.9 (H) 11.4 - 15.2 seconds   INR 1.2 0.8 - 1.2    Comment: (NOTE) INR goal varies based on device and disease states. Performed at [facility], [address] [place], [place] [address]   Type and screen [facility]     Status: None (Preliminary result)   Collection Time: 12/29/24  8:30 AM  Result Value Ref Range   ABO/RH(D) PENDING    Antibody Screen PENDING    Sample Expiration      01/01/2025,2359 Performed at [facility], [address], [place], [place] [address]   I-stat chem 8, ED     Status: Abnormal   Collection Time: 12/29/24  8:39 AM  Result Value Ref Range   Sodium 142 135 - 145 mmol/L   Potassium 4.1 3.5 - 5.1 mmol/L   Chloride 103 98 - 111 mmol/L   BUN 32 (H) 8 - 23 mg/dL   Creatinine, Ser 9.09 0.44 - 1.00 mg/dL   Glucose, Bld 879 (H) 70 - 99 mg/dL    Comment: Glucose reference  range applies only to samples taken after fasting for at least 8 hours.   Calcium , Ion 1.18 1.15 - 1.40 mmol/L   TCO2 27 22 - 32 mmol/L   Hemoglobin 12.2 12.0 - 15.0 g/dL   HCT 63.9 63.9 - 53.9 %    DG Hip Unilat With Pelvis 2-3 Views Left Result Date: 12/29/2024 CLINICAL DATA:  LEFT shoulder and hip pain status post fall EXAM: DG HIP (WITH OR WITHOUT PELVIS) 2-3V LEFT COMPARISON:  None available FINDINGS: Acute, mildly angulated, moderately displaced LEFT intertrochanteric femur fracture. Lumbar fusion hardware partially visualized. Mild diffuse osteopenia. Mild degenerative changes of the RIGHT hip. IMPRESSION: Acute, mildly angulated, moderately displaced LEFT intertrochanteric femur fracture. Electronically Signed   By: Aliene Lloyd M.D.   On: 12/29/2024 08:32   DG Knee 1-2 Views Left Result Date: 12/29/2024 CLINICAL DATA:  LEFT shoulder and hip pain status post fall EXAM: LEFT KNEE - 1-2 VIEW COMPARISON:  None available FINDINGS: Moderate joint space loss and spurring and chondrocalcinosis of the medial and lateral compartments. Mild joint space loss and spurring of the patellofemoral compartment. No fracture or dislocation. IMPRESSION: 1. No acute abnormality of the LEFT knee. 2. Moderate tricompartmental osteoarthrosis of the LEFT knee. Electronically Signed   By: Aliene Lloyd M.D.   On: 12/29/2024 08:30   DG Knee 1-2 Views Right Result Date: 12/29/2024 CLINICAL DATA:  LEFT shoulder and hip pain status post fall EXAM: RIGHT KNEE - 1-2 VIEW COMPARISON:  None available FINDINGS: RIGHT total knee prosthesis is well seated without periprosthetic fracture or lucency. No acute fracture or dislocation. Diffuse osteopenia. IMPRESSION: Uncomplicated RIGHT total knee prosthesis. No acute abnormality of the RIGHT knee. Electronically Signed   By: Aliene Lloyd M.D.   On: 12/29/2024 08:29   DG Ankle 2 Views Right Result Date: 12/29/2024 CLINICAL DATA:  LEFT shoulder and hip pain status post fall EXAM:  RIGHT ANKLE - 2 VIEW COMPARISON:  None available FINDINGS: Enthesopathic changes are seen at the insertion of the plantar fascia and Achilles tendon on the calcaneus. Mild degenerative spurring seen at the tip of the lateral malleolus. No acute fracture or dislocation. Visualized soft tissues are unremarkable.  IMPRESSION: No acute radiographic abnormality of the RIGHT ankle. Electronically Signed   By: Aliene Lloyd M.D.   On: 12/29/2024 08:28   DG Chest 1 View Result Date: 12/29/2024 CLINICAL DATA:  LEFT shoulder and hip pain status post fall EXAM: CHEST  1 VIEW COMPARISON:  10/08/2023 FINDINGS: The heart is moderately enlarged which appears increased since the prior chest radiograph. No pulmonary vascular congestion. Focal opacity in the LEFT lung base could relate to pneumonia or atelectasis. Lungs are otherwise clear. Known LEFT proximal humerus fracture better visualized on dedicated shoulder radiographs. IMPRESSION: 1. Interval worsening of moderate cardiomegaly. 2. Focal opacity in the LEFT lung base may be due to atelectasis or pneumonia. 3. Known LEFT proximal humerus fracture better seen on dedicated shoulder radiographs. Electronically Signed   By: Aliene Lloyd M.D.   On: 12/29/2024 08:26   DG Shoulder Left Result Date: 12/29/2024 CLINICAL DATA:  LEFT shoulder and hip pain status post fall EXAM: LEFT SHOULDER - 2+ VIEW COMPARISON:  None available FINDINGS: Mildly impacted and displaced fracture of the LEFT proximal humerus. Mild elevation of the clavicle relative the acromion measuring 9 mm, suspicious for [facility] joint separation. Diffuse osteopenia. Calcifications adjacent to the greater tuberosity consistent with calcific rotator cuff tendinosis. IMPRESSION: 1. Mildly impacted and displaced LEFT proximal humerus fracture. 2. Elevated LEFT clavicle relative the acromion, suspicious for [facility] joint separation. Electronically Signed   By: Aliene Lloyd M.D.   On: 12/29/2024 08:24    Review of Systems   HENT:  Negative for ear discharge, ear pain, hearing loss and tinnitus.   Eyes:  Negative for photophobia and pain.  Respiratory:  Negative for cough and shortness of breath.   Cardiovascular:  Negative for chest pain.  Gastrointestinal:  Negative for abdominal pain, nausea and vomiting.  Genitourinary:  Negative for dysuria, flank pain, frequency and urgency.  Musculoskeletal:  Positive for arthralgias (Left shoulder and left hip). Negative for back pain, myalgias and neck pain.  Neurological:  Negative for dizziness and headaches.  Hematological:  Does not bruise/bleed easily.  Psychiatric/Behavioral:  The patient is not nervous/anxious.    Blood pressure (!) 142/78, pulse 77, temperature (!) 97.4 F (36.3 C), temperature source Oral, resp. rate (!) 31, height 5' (1.524 m), weight 49.9 kg, SpO2 100%. Physical Exam Constitutional:      General: She is not in acute distress.    Appearance: She is well-developed. She is not diaphoretic.  HENT:     Head: Normocephalic and atraumatic.  Eyes:     General: No scleral icterus.       Right eye: No discharge.        Left eye: No discharge.     Conjunctiva/sclera: Conjunctivae normal.  Cardiovascular:     Rate and Rhythm: Normal rate and regular rhythm.  Pulmonary:     Effort: Pulmonary effort is normal. No respiratory distress.  Musculoskeletal:     Cervical back: Normal range of motion.     Comments: Left shoulder, elbow, wrist, digits- no skin wounds, shoulder mod TTP, mod edema, no instability, no blocks to motion  Sens  Ax/R/M/U intact  Mot   Ax/ R/ PIN/ M/ AIN/ U intact  Rad 2+  LLE No traumatic wounds, ecchymosis, or rash  Mod TTP hip  No knee or ankle effusion  Knee stable to varus/ valgus and anterior/posterior stress  Sens DPN, SPN, TN intact  Motor EHL, ext, flex, evers 5/5  DP 2+, PT 2+, No significant edema  Skin:  General: Skin is warm and dry.  Neurological:     Mental Status: She is alert.  Psychiatric:         Mood and Affect: Mood normal.        Behavior: Behavior normal.     Assessment/Plan: Left hip fx -- Plan IMN, possibly today if medicine clears in time with Dr. Kendal. Left humerus fx -- Plan non-operative management with sling and NWB.    Ozell DOROTHA Ned, PA-C Orthopedic Surgery [phone] 12/29/2024, 9:34 AM      [1]  Allergies Allergen Reactions   Alendronate     Other Reaction(s): Unknown   Calcitonin Nausea And Vomiting   Gabapentin     Other Reaction(s): blurred vision   Hydrocodone -Acetaminophen      Other Reaction(s): Dizzy   Latex     Other Reaction(s): Unknown   Meperidine  Hcl Nausea And Vomiting   Oxycodone -Acetaminophen      Other Reaction(s): Unknown   Pravastatin     Other Reaction(s): Unknown   Raloxifene     Other Reaction(s): Unknown   Risedronate     Other Reaction(s): Unknown   Tramadol  Hcl     Other Reaction(s): does not feel right   Darvocet [Propoxyphene N-Acetaminophen ] Nausea And Vomiting   Demerol  Hives   Etodolac Hives and Rash    Unknown to patient   Hydrocodone  Itching   Meprozine [Meperidine -Promethazine]     Reaction=tongue swelling   Penicillins Rash   Percocet [Oxycodone -Acetaminophen ] Nausea And Vomiting    Dizziness    Ultracet [Tramadol -Acetaminophen ] Other (See Comments)    headache

## 2024-12-29 NOTE — Interval H&P Note (Signed)
 History and Physical Interval Note:  12/29/2024 2:23 PM  Jessica Berg  has presented today for surgery, with the diagnosis of left intertroch fracture.  The various methods of treatment have been discussed with the patient and family. After consideration of risks, benefits and other options for treatment, the patient has consented to  Procedures: FIXATION, FRACTURE, INTERTROCHANTERIC, WITH INTRAMEDULLARY ROD (Left) as a surgical intervention.  The patient's history has been reviewed, patient examined, no change in status, stable for surgery.  I have reviewed the patient's chart and labs.  Questions were answered to the patient's satisfaction.     Javanni Maring P Rachell Druckenmiller

## 2024-12-29 NOTE — ED Provider Notes (Signed)
 " Jessica Berg EMERGENCY DEPARTMENT AT [facility] Provider Note   CSN: [id number] Arrival date & time: 12/29/24  9343     Patient presents with: [place]   Jessica Berg is a 89 y.o. female.  She is presenting by EMS from home after a fall.  Sounds mechanical.  Walking with walker got tripped up and fell.  She denies hitting her head or losing consciousness.  EMS noted severe left shoulder pain and left hip pain with shortening and rotation.  She is on blood thinners for atrial fibrillation.  Lives at home with family.  Received fentanyl  prior to arrival.   The history is provided by the patient and the EMS personnel.  Fall This is a new problem. The problem has not changed since onset.Pertinent negatives include no chest pain, no abdominal pain, no headaches and no shortness of breath. The symptoms are aggravated by bending and twisting. Nothing relieves the symptoms. She has tried nothing for the symptoms. The treatment provided no relief.  Hip Pain This is a new problem. The problem occurs constantly. The problem has not changed since onset.Pertinent negatives include no chest pain, no abdominal pain, no headaches and no shortness of breath.       Prior to Admission medications  Medication Sig Start Date End Date Taking? Authorizing Provider  apixaban  (ELIQUIS ) 2.5 MG TABS tablet Take 1 tablet (2.5 mg total) by mouth 2 (two) times daily. 06/21/22   Jeffrie Oneil BROCKS, MD  Calcium  Carbonate-Vitamin D3 600-400 MG-UNIT TABS Take 1 tablet by mouth daily.    [provider]  celecoxib  (CELEBREX ) 200 MG capsule Take 200 mg by mouth daily.      [provider]  cholecalciferol  (VITAMIN D ) 1000 UNITS tablet Take 1,000 Units by mouth daily.      [provider]  metoprolol  succinate (TOPROL -XL) 25 MG 24 hr tablet TAKE ONE AND ONE-HALF TABLETS DAILY 02/20/23   Jeffrie Oneil BROCKS, MD  vitamin C  (ASCORBIC ACID ) 500 MG tablet Take 500 mg by mouth daily.      [provider]    Allergies: Alendronate, Calcitonin, Gabapentin, Hydrocodone -acetaminophen , Latex, Meperidine  hcl, Oxycodone -acetaminophen , Pravastatin, Raloxifene, Risedronate, Tramadol  hcl, Darvocet [propoxyphene n-acetaminophen ], Demerol , Etodolac, Hydrocodone , Meprozine [meperidine -promethazine], Penicillins, Percocet [oxycodone -acetaminophen ], and Ultracet [tramadol -acetaminophen ]    Review of Systems  Constitutional:  Negative for fever.  Respiratory:  Negative for shortness of breath.   Cardiovascular:  Negative for chest pain.  Gastrointestinal:  Negative for abdominal pain.  Musculoskeletal:  Negative for neck pain.  Neurological:  Negative for headaches.    Updated Vital Signs Ht 5' (1.524 m)   Wt 49.9 kg   BMI 21.48 kg/m   Physical Exam Vitals and nursing note reviewed.  Constitutional:      General: She is not in acute distress.    Appearance: Normal appearance. She is well-developed.  HENT:     Head: Normocephalic and atraumatic.  Eyes:     Conjunctiva/sclera: Conjunctivae normal.  Cardiovascular:     Rate and Rhythm: Normal rate. Rhythm irregular.     Heart sounds: No murmur heard. Pulmonary:     Effort: Pulmonary effort is normal. No respiratory distress.     Breath sounds: Normal breath sounds. No stridor. No wheezing.  Abdominal:     Palpations: Abdomen is soft.     Tenderness: There is no abdominal tenderness. There is no guarding or rebound.  Musculoskeletal:        General: Tenderness and deformity present.  Cervical back: Neck supple.     Comments: She has tenderness over her left shoulder and elbow.  Tenderness left hip left knee.  Tenderness right knee right ankle.  She has shortening of left lower extremity.  Distal pulses motor and sensation intact.  Skin:    General: Skin is warm and dry.  Neurological:     General: No focal deficit present.     Mental Status: She is alert.     GCS: GCS eye subscore is 4. GCS verbal subscore is 5. GCS motor  subscore is 6.     Sensory: No sensory deficit.     Motor: No weakness.     (all labs ordered are listed, but only abnormal results are displayed) Labs Reviewed  BASIC METABOLIC PANEL WITH GFR - Abnormal; Notable for the following components:      Result Value   Glucose, Bld 123 (*)    BUN 30 (*)    GFR, Estimated 60 (*)    All other components within normal limits  CBC WITH DIFFERENTIAL/PLATELET - Abnormal; Notable for the following components:   RBC 3.79 (*)    Hemoglobin 11.9 (*)    Platelets 137 (*)    Neutro Abs 8.3 (*)    Lymphs Abs 0.4 (*)    All other components within normal limits  PROTIME-INR - Abnormal; Notable for the following components:   Prothrombin Time 15.9 (*)    All other components within normal limits  BASIC METABOLIC PANEL WITH GFR - Abnormal; Notable for the following components:   Glucose, Bld 131 (*)    BUN 25 (*)    Calcium  8.8 (*)    GFR, Estimated 56 (*)    All other components within normal limits  CBC - Abnormal; Notable for the following components:   RBC 2.92 (*)    Hemoglobin 9.4 (*)    HCT 28.2 (*)    Platelets 112 (*)    All other components within normal limits  I-STAT CHEM 8, ED - Abnormal; Notable for the following components:   BUN 32 (*)    Glucose, Bld 120 (*)    All other components within normal limits  SURGICAL PCR SCREEN  VITAMIN D  25 HYDROXY (VIT D DEFICIENCY, FRACTURES)  MAGNESIUM   TYPE AND SCREEN    EKG: EKG Interpretation Date/Time:  Wednesday December 29 2024 08:43:25 EST Ventricular Rate:  86 PR Interval:    QRS Duration:  112 QT Interval:  435 QTC Calculation: 521 R Axis:   -22  Text Interpretation: Atrial fibrillation Borderline intraventricular conduction delay Repol abnrm suggests ischemia, lateral leads ST elevation, consider inferior injury Prolonged QT interval Artifact in lead(s) I II aVR aVL V1 Confirmed by Towana Sharper [phone]) on 12/29/2024 8:54:01 AM  Radiology: [facility]COLA HIP UNILAT WITH PELVIS 2-3  VIEWS LEFT Result Date: 12/29/2024 CLINICAL DATA:  Surgical internal fixation proximal left femoral fracture. EXAM: DG HIP (WITH OR WITHOUT PELVIS) 2-3V LEFT; DG C-ARM 1-60 MIN-NO REPORT Radiation exposure index: 4.29 mGy COMPARISON:  Same day FINDINGS: Six intraoperative fluoroscopic images were obtained of the left hip. These demonstrate surgical internal fixation of proximal left femoral fracture. IMPRESSION: Fluoroscopic guidance provided during surgical internal fixation of proximal left femoral fracture. Electronically Signed   By: Lynwood Landy Raddle M.D.   On: 12/29/2024 15:43   DG C-Arm 1-60 Min-No Report Result Date: 12/29/2024 Fluoroscopy was utilized by the requesting physician.  No radiographic interpretation.   CT CHEST ABDOMEN PELVIS W CONTRAST Result Date: 12/29/2024 EXAM:  CT CHEST, ABDOMEN AND PELVIS WITH CONTRAST 12/29/2024 09:18:18 AM TECHNIQUE: CT of the chest, abdomen and pelvis was performed with the administration of intravenous contrast. Multiplanar reformatted images are provided for review. Automated exposure control, iterative reconstruction, and/or weight based adjustment of the mA/kV was utilized to reduce the radiation dose to as low as reasonably achievable. COMPARISON: CT of the abdomen and pelvis dated 12/27/2015. CLINICAL HISTORY: Polytrauma, blunt. The patient sustained polytrauma due to blunt force. FINDINGS: CHEST: MEDIASTINUM AND LYMPH NODES: Heart is moderately enlarged and there is mild calcific coronary artery disease present. The main pulmonary artery is dilated, measuring approximately 4 cm in diameter. The ascending thoracic aorta is also mildly prominent, measuring 3.5 cm in diameter. The descending thoracic aorta is tortuous and demonstrates mild-to-moderate calcific atheromatous disease. The great arteries are widely patent. The central airways are clear. No mediastinal, hilar or axillary lymphadenopathy. LUNGS AND PLEURA: There is mild dependent atelectasis present  bilaterally. There are also small bilateral pleural effusions present. No focal consolidation or pulmonary edema. No pneumothorax. ABDOMEN AND PELVIS: LIVER: Unremarkable. GALLBLADDER AND BILE DUCTS: Unremarkable. No biliary ductal dilatation. SPLEEN: No acute abnormality. PANCREAS: No acute abnormality. ADRENAL GLANDS: No acute abnormality. KIDNEYS, URETERS AND BLADDER: There are simple renal cysts present bilaterally. Per consensus, no follow-up is needed for simple Bosniak type 1 and 2 renal cysts, unless the patient has a malignancy history or risk factors. No stones in the kidneys or ureters. No hydronephrosis. No perinephric or periureteral stranding. Urinary bladder is unremarkable. GI AND BOWEL: Stomach demonstrates no acute abnormality. There is a right inguinal hernia, which contains a short segment of unobstructed distal small bowel. There is no bowel obstruction. REPRODUCTIVE ORGANS: The patient is status post hysterectomy and bilateral salpingo-oophorectomy. PERITONEUM AND RETROPERITONEUM: No ascites. No free air. VASCULATURE: Aorta is normal in caliber. ABDOMINAL AND PELVIS LYMPH NODES: No lymphadenopathy. BONES AND SOFT TISSUES: There is a mildly displaced and angulated fracture of the left humeral head and neck. There is sigmoid scoliosis of the thoracolumbar spine, with the lumbar curvature convex to the left. There is a comminuted left intertrochanteric fracture with mild varus angulation. The patient is status post interbody fusion and bilateral posterolateral spinal fixation at L4-L5. There is multilevel chronic degenerative disc disease present within the thoracolumbar spine. No focal soft tissue abnormality. IMPRESSION: 1. Mildly displaced and angulated fracture of the left humeral head and neck. 2. Comminuted left intertrochanteric fracture with mild varus angulation. 3. Small bilateral pleural effusions with mild dependent atelectasis. 4. Dilated main pulmonary artery (approximately 4.0 cm),  which can be seen with pulmonary arterial hypertension; moderate cardiomegaly. 5. Right inguinal hernia containing a short segment of unobstructed distal small bowel. Electronically signed by: Evalene Coho MD 12/29/2024 09:54 AM EST RP Workstation: [id number]   CT CERVICAL SPINE WO CONTRAST Result Date: 12/29/2024 EXAM: CT CERVICAL SPINE WITHOUT CONTRAST 12/29/2024 09:18:18 AM TECHNIQUE: CT of the cervical spine was performed without the administration of intravenous contrast. Multiplanar reformatted images are provided for review. Automated exposure control, iterative reconstruction, and/or weight based adjustment of the mA/kV was utilized to reduce the radiation dose to as low as reasonably achievable. COMPARISON: CT of the cervical spine dated 09/03/2022. CLINICAL HISTORY: Neck trauma (Age >= 65y). Neck trauma in a patient aged 71 years or older. FINDINGS: BONES AND ALIGNMENT: No acute fracture or traumatic malalignment. DEGENERATIVE CHANGES: Slight degenerative anterolisthesis at C3-C4 and C4-C5. Disc space narrowing and mild posterior endplate ridging at C5-C6 and C6-C7. Diffuse bilateral facet arthrosis  present, most pronounced on the left at C2-C3 and C3-C4. SOFT TISSUES: No prevertebral soft tissue swelling. LUNGS: Mild left pleural effusion. Mild biapical pleural disease. IMPRESSION: 1. No evidence of acute traumatic injury. 2. Multilevel cervical spondylosis with degenerative listhesis at C3-4 and C4-5; disc space narrowing and mild posterior endplate ridging at C5-6 and C6-7; diffuse facet arthrosis most pronounced on the left at C2-3 and C3-4. 3. Mild left pleural effusion and mild biapical pleural disease. Electronically signed by: Evalene Coho MD 12/29/2024 09:40 AM EST RP Workstation: [id number]   CT HEAD WO CONTRAST Result Date: 12/29/2024 EXAM: CT HEAD WITHOUT CONTRAST 12/29/2024 09:18:18 AM TECHNIQUE: CT of the head was performed without the administration of intravenous contrast.  Automated exposure control, iterative reconstruction, and/or weight based adjustment of the mA/kV was utilized to reduce the radiation dose to as low as reasonably achievable. COMPARISON: None available. CLINICAL HISTORY: Minor head trauma in a patient aged 28 years or older. FINDINGS: BRAIN AND VENTRICLES: No acute hemorrhage. No evidence of acute infarct. Proportional prominence of ventricles and sulci, consistent with diffuse cerebral parenchymal volume loss. Periventricular and subcortical white matter hypoattenuation, consistent with moderate chronic ischemic microvascular disease. No hydrocephalus. No extra-axial collection. No mass effect or midline shift. ORBITS: Bilateral lens replacement noted. Calcified atherosclerotic plaque within cavernous/supraclinoid internal carotid arteries. SINUSES: No acute abnormality. SOFT TISSUES AND SKULL: Left parietal scalp soft tissue swelling. No skull fracture. IMPRESSION: 1. No acute intracranial abnormality related to minor head trauma. 2. Left parietal scalp soft tissue swelling. 3. Diffuse cerebral parenchymal volume loss and moderate chronic ischemic microvascular disease. 4. Calcified atherosclerotic plaque within the cavernous/supraclinoid internal carotid arteries. Electronically signed by: Evalene Coho MD 12/29/2024 09:35 AM EST RP Workstation: [id number]   DG Hip Unilat With Pelvis 2-3 Views Left Result Date: 12/29/2024 CLINICAL DATA:  LEFT shoulder and hip pain status post fall EXAM: DG HIP (WITH OR WITHOUT PELVIS) 2-3V LEFT COMPARISON:  None available FINDINGS: Acute, mildly angulated, moderately displaced LEFT intertrochanteric femur fracture. Lumbar fusion hardware partially visualized. Mild diffuse osteopenia. Mild degenerative changes of the RIGHT hip. IMPRESSION: Acute, mildly angulated, moderately displaced LEFT intertrochanteric femur fracture. Electronically Signed   By: Aliene Lloyd M.D.   On: 12/29/2024 08:32   DG Knee 1-2 Views  Left Result Date: 12/29/2024 CLINICAL DATA:  LEFT shoulder and hip pain status post fall EXAM: LEFT KNEE - 1-2 VIEW COMPARISON:  None available FINDINGS: Moderate joint space loss and spurring and chondrocalcinosis of the medial and lateral compartments. Mild joint space loss and spurring of the patellofemoral compartment. No fracture or dislocation. IMPRESSION: 1. No acute abnormality of the LEFT knee. 2. Moderate tricompartmental osteoarthrosis of the LEFT knee. Electronically Signed   By: Aliene Lloyd M.D.   On: 12/29/2024 08:30   DG Knee 1-2 Views Right Result Date: 12/29/2024 CLINICAL DATA:  LEFT shoulder and hip pain status post fall EXAM: RIGHT KNEE - 1-2 VIEW COMPARISON:  None available FINDINGS: RIGHT total knee prosthesis is well seated without periprosthetic fracture or lucency. No acute fracture or dislocation. Diffuse osteopenia. IMPRESSION: Uncomplicated RIGHT total knee prosthesis. No acute abnormality of the RIGHT knee. Electronically Signed   By: Aliene Lloyd M.D.   On: 12/29/2024 08:29   DG Ankle 2 Views Right Result Date: 12/29/2024 CLINICAL DATA:  LEFT shoulder and hip pain status post fall EXAM: RIGHT ANKLE - 2 VIEW COMPARISON:  None available FINDINGS: Enthesopathic changes are seen at the insertion of the plantar fascia and Achilles tendon on the  calcaneus. Mild degenerative spurring seen at the tip of the lateral malleolus. No acute fracture or dislocation. Visualized soft tissues are unremarkable. IMPRESSION: No acute radiographic abnormality of the RIGHT ankle. Electronically Signed   By: Aliene Lloyd M.D.   On: 12/29/2024 08:28   DG Chest 1 View Result Date: 12/29/2024 CLINICAL DATA:  LEFT shoulder and hip pain status post fall EXAM: CHEST  1 VIEW COMPARISON:  10/08/2023 FINDINGS: The heart is moderately enlarged which appears increased since the prior chest radiograph. No pulmonary vascular congestion. Focal opacity in the LEFT lung base could relate to pneumonia or atelectasis.  Lungs are otherwise clear. Known LEFT proximal humerus fracture better visualized on dedicated shoulder radiographs. IMPRESSION: 1. Interval worsening of moderate cardiomegaly. 2. Focal opacity in the LEFT lung base may be due to atelectasis or pneumonia. 3. Known LEFT proximal humerus fracture better seen on dedicated shoulder radiographs. Electronically Signed   By: Aliene Lloyd M.D.   On: 12/29/2024 08:26   DG Shoulder Left Result Date: 12/29/2024 CLINICAL DATA:  LEFT shoulder and hip pain status post fall EXAM: LEFT SHOULDER - 2+ VIEW COMPARISON:  None available FINDINGS: Mildly impacted and displaced fracture of the LEFT proximal humerus. Mild elevation of the clavicle relative the acromion measuring 9 mm, suspicious for [facility] joint separation. Diffuse osteopenia. Calcifications adjacent to the greater tuberosity consistent with calcific rotator cuff tendinosis. IMPRESSION: 1. Mildly impacted and displaced LEFT proximal humerus fracture. 2. Elevated LEFT clavicle relative the acromion, suspicious for [facility] joint separation. Electronically Signed   By: Aliene Lloyd M.D.   On: 12/29/2024 08:24     .Critical Care  Performed by: Towana Ozell BROCKS, MD Authorized by: Towana Ozell BROCKS, MD   Critical care provider statement:    Critical care time (minutes):  45   Critical care time was exclusive of:  Separately billable procedures and treating other patients   Critical care was necessary to treat or prevent imminent or life-threatening deterioration of the following conditions:  Trauma   Critical care was time spent personally by me on the following activities:  Development of treatment plan with patient or surrogate, discussions with consultants, evaluation of patient's response to treatment, examination of patient, obtaining history from patient or surrogate, ordering and performing treatments and interventions, ordering and review of laboratory studies, ordering and review of radiographic studies, pulse  oximetry, re-evaluation of patient's condition and review of old charts   I assumed direction of critical care for this patient from another provider in my specialty: no      Medications Ordered in the ED  0.9 %  sodium chloride  infusion ( Intravenous New Bag/Given 12/29/24 1741)  sodium chloride  flush (NS) 0.9 % injection 3 mL (3 mLs Intravenous Given 12/29/24 2223)  sodium chloride  flush (NS) 0.9 % injection 3 mL ( Intravenous MAR Unhold 12/29/24 1717)  0.9 %  sodium chloride  infusion ( Intravenous MAR Unhold 12/29/24 1717)  acetaminophen  (TYLENOL ) tablet 650 mg ( Oral MAR Unhold 12/29/24 1717)    Or  acetaminophen  (TYLENOL ) suppository 650 mg ( Rectal MAR Unhold 12/29/24 1717)  HYDROcodone -acetaminophen  (NORCO/VICODIN) 5-325 MG per tablet 1-2 tablet (1 tablet Oral Given 12/29/24 2019)  ondansetron  (ZOFRAN ) tablet 4 mg ( Oral See Alternative 12/29/24 2055)    Or  ondansetron  (ZOFRAN ) injection 4 mg (4 mg Intravenous Given 12/29/24 2055)  hydrALAZINE  (APRESOLINE ) injection 5 mg ( Intravenous MAR Unhold 12/29/24 1717)  metoprolol  succinate (TOPROL -XL) 24 hr tablet 37.5 mg ( Oral MAR Unhold 12/29/24 1717)  ascorbic acid  (VITAMIN C ) tablet 500 mg ( Oral MAR Unhold 12/29/24 1717)  fentaNYL  (SUBLIMAZE ) injection 25 mcg ( Intravenous MAR Unhold 12/29/24 1717)  tranexamic acid  (CYKLOKAPRON ) 1000MG /138mL IVPB (has no administration in time range)  docusate sodium  (COLACE) capsule 100 mg (100 mg Oral Given 12/29/24 2220)  polyethylene glycol (MIRALAX  / GLYCOLAX ) packet 17 g (has no administration in time range)  metoCLOPramide  (REGLAN ) tablet 5-10 mg (has no administration in time range)    Or  metoCLOPramide  (REGLAN ) injection 5-10 mg (has no administration in time range)  ceFAZolin  (ANCEF ) IVPB 2g/100 mL premix (2 g Intravenous New Bag/Given 12/30/24 0615)  fentaNYL  (SUBLIMAZE ) injection 50 mcg (50 mcg Intravenous Given 12/29/24 0935)  ondansetron  (ZOFRAN ) injection 4 mg (4 mg Intravenous Given 12/29/24 0839)   iohexol  (OMNIPAQUE ) 350 MG/ML injection 75 mL (75 mLs Intravenous Contrast Given 12/29/24 0910)  chlorhexidine  (HIBICLENS ) 4 % liquid 4 Application (4 Applications Topical Given 12/29/24 1416)  ceFAZolin  (ANCEF ) IVPB 2g/100 mL premix ( Intravenous Anesthesia Volume Adjustment 12/29/24 1544)  tranexamic acid  (CYKLOKAPRON ) IVPB 1,000 mg ( Intravenous Anesthesia Volume Adjustment 12/29/24 1544)  chlorhexidine  (PERIDEX ) 0.12 % solution (15 mLs  Given 12/29/24 1415)  ceFAZolin  (ANCEF ) 2-4 GM/100ML-% IVPB (  Override pull for Anesthesia 12/29/24 1456)  tranexamic acid  (CYKLOKAPRON ) IVPB 1,000 mg (1,000 mg Intravenous New Bag/Given 12/29/24 1906)    Clinical Course as of 12/30/24 0815  Wed Dec 29, 2024  1022 Consulted orthopedic PA Jerona.  He said that they would possibly operate this afternoon if medically cleared.  Discussed with [facility] hospitalist Dr. Sonjia [facility] hospital[facility] who will evaluate for admission.  Family updated. [MB]    Clinical Course User Index [MB] Towana Ozell BROCKS, MD                                 Medical Decision Making Amount and/or Complexity of Data Reviewed Labs: ordered. Radiology: ordered.  Risk Prescription drug management. Decision regarding hospitalization.   This patient complains of fall pain mostly left-sided body; this involves an extensive number of treatment Options and is a complaint that carries with it a high risk of complications and morbidity. The differential includes fracture, contusion, dislocation, intracranial bleed  I ordered, reviewed and interpreted labs, which included CBC fairly stable from priors, chemistries unremarkable, I ordered medication IV pain medication and reviewed PMP when indicated. I ordered imaging studies which included CT head chest abdomen and pelvis, CT C-spine, x-rays of extremities and I independently    visualized and interpreted imaging which showed proximal left humerus fracture, intratrochanteric  fracture Additional history obtained from EMS and patient's family members Previous records obtained and reviewed in epic including outpatient PCP and cardiology notes I consulted orthopedics Garrel Jerona, [facility] hospitalist Dr. Sonjia and discussed lab and imaging findings and discussed disposition.  Cardiac monitoring reviewed, A-fib with controlled rate Social determinants considered, no significant barriers Critical Interventions: Complex workup of low level trauma fall on blood thinners requiring complex imaging and discussion with consultants  After the interventions stated above, I reevaluated the patient and found patient to be neurovascularly intact and pain is adequately controlled Admission and further testing considered, she will need medical admission to the hospital and orthopedics consultation for likely operative repair of hip fracture.  Family updated and in agreement with plan.      Final diagnoses:  Fall, initial encounter  Closed fracture of left hip, initial encounter (HCC)  Closed fracture of  proximal end of left humerus, unspecified fracture morphology, initial encounter    ED Discharge Orders     None          Towana Ozell BROCKS, MD 12/30/24 [phone]  "

## 2024-12-29 NOTE — H&P (Addendum)
 " [facility] History and Physical  Jessica Berg FMW:[id number] DOB: 10-Jun-1924 DOA: 12/29/2024  Referring physician: ED  PCP: Charlott Dorn LABOR, MD   Patient is coming from: Home  Chief Complaint: Mechanical fall  HPI:  Patient is a 89 years old female with past medical history of atrial fibrillation on anticoagulation, CKD stage II, GERD, hyperlipidemia, osteoarthritis and osteoporosis presented to the hospital after sustaining a mechanical fall. While walking on walker, she got tripped and fell without any loss of consciousness.  Patient subsequently complained of left hip and shoulder pain.  Patient denies any shortness of breath, dyspnea, chest pain, palpitation or dizziness.  Patient denies any fever, nausea, vomiting, abdominal pain.  Denies any urinary urgency, frequency or dysuria.  No changes in her bowel habits.  Had anticoagulation last dose yesterday.  At baseline, patient uses a walker for ambulation.  In the ED, patient was noted to have stable vitals.  Labs were notable for no leukocytosis hemoglobin of 11.9.  Chemistry was notable for potassium of 4.1.  X-ray of the knee on the left showed osteoarthritis and right knee with prosthesis, right ankle with no fracture.  Chest x-ray with cardiomegaly but no infiltrate.  X-ray of the left shoulder showed impacted and displaced fracture of the left proximal humerus.  X-ray of the pelvis showed acute mildly angulated moderately displaced left intertrochanteric femur fracture.  CT head/cervical spine scan was negative for acute findings.  CT scan of the abdomen and pelvis also did not show any acute findings in the abdomen.  EKG showed atrial fibrillation.  Orthopedics was consulted and patient was considered for admission to the hospital for further evaluation and treatment.  Assessment and Plan Principal Problem:   Closed left hip fracture (HCC)  Mechanical fall with displaced left intertrochanteric femur  fracture Orthopedics has been consulted for possible surgical intervention.  Continue analgesics, supportive care.  keep n.p.o. for now.  Continue IV fluids.  Last dose of anticoagulation yesterday.  Follow surgical recommendations and further treatment plan.  Left proximal humerus fracture.  Continue with sling and nonweightbearing.  Orthopedics on board.  History of atrial fibrillation on anticoagulation.  Will hold anticoagulation for now.  Rate controlled at this time.  Last dose yesterday evening.  Continue metoprolol .  CKD stage II  Creatinine at 0.8 and at baseline.  Check BMP in AM.   GERD Add Protonix.  DVT Prophylaxis: Lovenox  subcu from tomorrow  Review of Systems:  All systems were reviewed and were negative unless otherwise mentioned in the HPI   Past Medical History:  Diagnosis Date   Atrial fibrillation (HCC)    Chronic renal disease, stage II    GERD (gastroesophageal reflux disease)    Hypercholesteremia    MR (mitral regurgitation)    , Mild   Osteoarthritis    Osteoporosis    Past Surgical History:  Procedure Laterality Date   BACK SURGERY     JOINT REPLACEMENT      Social History:  reports that she has never smoked. She has never used smokeless tobacco. She reports that she does not drink alcohol and does not use drugs.  Allergies[1]  Family History  Problem Relation Age of Onset   Arthritis Mother    Hypothyroidism Mother    Heart attack Father      Prior to Admission medications  Medication Sig Start Date End Date Taking? Authorizing Provider  apixaban  (ELIQUIS ) 2.5 MG TABS tablet Take 1 tablet (2.5 mg total) by mouth 2 (two)  times daily. 06/21/22   Jeffrie Oneil BROCKS, MD  Calcium  Carbonate-Vitamin D3 600-400 MG-UNIT TABS Take 1 tablet by mouth daily.    [provider]  celecoxib  (CELEBREX ) 200 MG capsule Take 200 mg by mouth daily.      [provider]  cholecalciferol  (VITAMIN D ) 1000 UNITS tablet Take 1,000 Units by mouth  daily.      [provider]  metoprolol  succinate (TOPROL -XL) 25 MG 24 hr tablet TAKE ONE AND ONE-HALF TABLETS DAILY 02/20/23   Jeffrie Oneil BROCKS, MD  vitamin C  (ASCORBIC ACID ) 500 MG tablet Take 500 mg by mouth daily.      [provider]    Physical Exam:  Vitals:   12/29/24 0709 12/29/24 0830 12/29/24 0843  BP: 138/74 (!) 142/78   Pulse: (!) 59 77   Resp: 14 (!) 31   Temp: (!) 97.4 F (36.3 C)    TempSrc: Oral    SpO2: 97% 100% 100%  Weight: 49.9 kg    Height: 5' (1.524 m)     Wt Readings from Last 3 Encounters:  12/29/24 49.9 kg  09/09/24 53.1 kg  05/21/23 57.7 kg   Body mass index is 21.48 kg/m.  General:  Average built, not in obvious distress elderly female, alert awake and oriented, slightly hard of hearing.  On nasal cannula oxygen HENT: Normocephalic, No scleral pallor or icterus noted. Oral mucosa is moist.  Chest:  Clear breath sounds.  . No crackles or wheezes.  CVS: S1 &S2 heard. No murmur.  Regular rate and rhythm. Abdomen: Soft, nontender, nondistended.  Bowel sounds are heard. No abdominal mass palpated Extremities: No cyanosis, clubbing or edema.  Peripheral pulses are palpable.  Left lower extremity externally rotated with hip tenderness.  Left upper extremity with shoulder bruise and tenderness on palpation. Psych: Alert, awake and oriented, normal mood CNS:  No cranial nerve deficits.  Skin: Warm and dry.  No rashes noted.  Labs on Admission:   CBC: Recent Labs  Lab 12/29/24 0830 12/29/24 0839  WBC 9.4  --   NEUTROABS 8.3*  --   HGB 11.9* 12.2  HCT 37.3 36.0  MCV 98.4  --   PLT 137*  --     Basic Metabolic Panel: Recent Labs  Lab 12/29/24 0830 12/29/24 0839  NA 142 142  K 4.2 4.1  CL 105 103  CO2 27  --   GLUCOSE 123* 120*  BUN 30* 32*  CREATININE 0.87 0.90  CALCIUM  9.3  --     Liver Function Tests: No results for input(s): AST, ALT, ALKPHOS, BILITOT, PROT, ALBUMIN  in the last 168 hours. No results for  input(s): LIPASE, AMYLASE in the last 168 hours. No results for input(s): AMMONIA in the last 168 hours.  Cardiac Enzymes: No results for input(s): CKTOTAL, CKMB, CKMBINDEX, TROPONINI in the last 168 hours.  BNP (last 3 results) No results for input(s): BNP in the last 8760 hours.  ProBNP (last 3 results) No results for input(s): PROBNP in the last 8760 hours.  CBG: No results for input(s): GLUCAP in the last 168 hours.  Lipase  No results found for: LIPASE   Urinalysis    Component Value Date/Time   COLORURINE YELLOW 09/04/2023 1723   APPEARANCEUR CLEAR 09/04/2023 1723   LABSPEC 1.010 09/04/2023 1723   PHURINE 6.5 09/04/2023 1723   GLUCOSEU NEGATIVE 09/04/2023 1723   HGBUR NEGATIVE 09/04/2023 1723   BILIRUBINUR NEGATIVE 09/04/2023 1723   KETONESUR NEGATIVE 09/04/2023 1723   PROTEINUR NEGATIVE  09/04/2023 1723   UROBILINOGEN 0.2 11/23/2011 1422   NITRITE NEGATIVE 09/04/2023 1723   LEUKOCYTESUR NEGATIVE 09/04/2023 1723     Drugs of Abuse  No results found for: LABOPIA, COCAINSCRNUR, LABBENZ, AMPHETMU, THCU, LABBARB    Radiological Exams on Admission: CT CHEST ABDOMEN PELVIS W CONTRAST Result Date: 12/29/2024 EXAM: CT CHEST, ABDOMEN AND PELVIS WITH CONTRAST 12/29/2024 09:18:18 AM TECHNIQUE: CT of the chest, abdomen and pelvis was performed with the administration of intravenous contrast. Multiplanar reformatted images are provided for review. Automated exposure control, iterative reconstruction, and/or weight based adjustment of the mA/kV was utilized to reduce the radiation dose to as low as reasonably achievable. COMPARISON: CT of the abdomen and pelvis dated 12/27/2015. CLINICAL HISTORY: Polytrauma, blunt. The patient sustained polytrauma due to blunt force. FINDINGS: CHEST: MEDIASTINUM AND LYMPH NODES: Heart is moderately enlarged and there is mild calcific coronary artery disease present. The main pulmonary artery is dilated, measuring  approximately 4 cm in diameter. The ascending thoracic aorta is also mildly prominent, measuring 3.5 cm in diameter. The descending thoracic aorta is tortuous and demonstrates mild-to-moderate calcific atheromatous disease. The great arteries are widely patent. The central airways are clear. No mediastinal, hilar or axillary lymphadenopathy. LUNGS AND PLEURA: There is mild dependent atelectasis present bilaterally. There are also small bilateral pleural effusions present. No focal consolidation or pulmonary edema. No pneumothorax. ABDOMEN AND PELVIS: LIVER: Unremarkable. GALLBLADDER AND BILE DUCTS: Unremarkable. No biliary ductal dilatation. SPLEEN: No acute abnormality. PANCREAS: No acute abnormality. ADRENAL GLANDS: No acute abnormality. KIDNEYS, URETERS AND BLADDER: There are simple renal cysts present bilaterally. Per consensus, no follow-up is needed for simple Bosniak type 1 and 2 renal cysts, unless the patient has a malignancy history or risk factors. No stones in the kidneys or ureters. No hydronephrosis. No perinephric or periureteral stranding. Urinary bladder is unremarkable. GI AND BOWEL: Stomach demonstrates no acute abnormality. There is a right inguinal hernia, which contains a short segment of unobstructed distal small bowel. There is no bowel obstruction. REPRODUCTIVE ORGANS: The patient is status post hysterectomy and bilateral salpingo-oophorectomy. PERITONEUM AND RETROPERITONEUM: No ascites. No free air. VASCULATURE: Aorta is normal in caliber. ABDOMINAL AND PELVIS LYMPH NODES: No lymphadenopathy. BONES AND SOFT TISSUES: There is a mildly displaced and angulated fracture of the left humeral head and neck. There is sigmoid scoliosis of the thoracolumbar spine, with the lumbar curvature convex to the left. There is a comminuted left intertrochanteric fracture with mild varus angulation. The patient is status post interbody fusion and bilateral posterolateral spinal fixation at L4-L5. There is  multilevel chronic degenerative disc disease present within the thoracolumbar spine. No focal soft tissue abnormality. IMPRESSION: 1. Mildly displaced and angulated fracture of the left humeral head and neck. 2. Comminuted left intertrochanteric fracture with mild varus angulation. 3. Small bilateral pleural effusions with mild dependent atelectasis. 4. Dilated main pulmonary artery (approximately 4.0 cm), which can be seen with pulmonary arterial hypertension; moderate cardiomegaly. 5. Right inguinal hernia containing a short segment of unobstructed distal small bowel. Electronically signed by: Evalene Coho MD 12/29/2024 09:54 AM EST RP Workstation: [id number]   CT CERVICAL SPINE WO CONTRAST Result Date: 12/29/2024 EXAM: CT CERVICAL SPINE WITHOUT CONTRAST 12/29/2024 09:18:18 AM TECHNIQUE: CT of the cervical spine was performed without the administration of intravenous contrast. Multiplanar reformatted images are provided for review. Automated exposure control, iterative reconstruction, and/or weight based adjustment of the mA/kV was utilized to reduce the radiation dose to as low as reasonably achievable. COMPARISON: CT of the cervical  spine dated 09/03/2022. CLINICAL HISTORY: Neck trauma (Age >= 65y). Neck trauma in a patient aged 36 years or older. FINDINGS: BONES AND ALIGNMENT: No acute fracture or traumatic malalignment. DEGENERATIVE CHANGES: Slight degenerative anterolisthesis at C3-C4 and C4-C5. Disc space narrowing and mild posterior endplate ridging at C5-C6 and C6-C7. Diffuse bilateral facet arthrosis present, most pronounced on the left at C2-C3 and C3-C4. SOFT TISSUES: No prevertebral soft tissue swelling. LUNGS: Mild left pleural effusion. Mild biapical pleural disease. IMPRESSION: 1. No evidence of acute traumatic injury. 2. Multilevel cervical spondylosis with degenerative listhesis at C3-4 and C4-5; disc space narrowing and mild posterior endplate ridging at C5-6 and C6-7; diffuse facet  arthrosis most pronounced on the left at C2-3 and C3-4. 3. Mild left pleural effusion and mild biapical pleural disease. Electronically signed by: Evalene Coho MD 12/29/2024 09:40 AM EST RP Workstation: [id number]   CT HEAD WO CONTRAST Result Date: 12/29/2024 EXAM: CT HEAD WITHOUT CONTRAST 12/29/2024 09:18:18 AM TECHNIQUE: CT of the head was performed without the administration of intravenous contrast. Automated exposure control, iterative reconstruction, and/or weight based adjustment of the mA/kV was utilized to reduce the radiation dose to as low as reasonably achievable. COMPARISON: None available. CLINICAL HISTORY: Minor head trauma in a patient aged 10 years or older. FINDINGS: BRAIN AND VENTRICLES: No acute hemorrhage. No evidence of acute infarct. Proportional prominence of ventricles and sulci, consistent with diffuse cerebral parenchymal volume loss. Periventricular and subcortical white matter hypoattenuation, consistent with moderate chronic ischemic microvascular disease. No hydrocephalus. No extra-axial collection. No mass effect or midline shift. ORBITS: Bilateral lens replacement noted. Calcified atherosclerotic plaque within cavernous/supraclinoid internal carotid arteries. SINUSES: No acute abnormality. SOFT TISSUES AND SKULL: Left parietal scalp soft tissue swelling. No skull fracture. IMPRESSION: 1. No acute intracranial abnormality related to minor head trauma. 2. Left parietal scalp soft tissue swelling. 3. Diffuse cerebral parenchymal volume loss and moderate chronic ischemic microvascular disease. 4. Calcified atherosclerotic plaque within the cavernous/supraclinoid internal carotid arteries. Electronically signed by: Evalene Coho MD 12/29/2024 09:35 AM EST RP Workstation: [id number]   DG Hip Unilat With Pelvis 2-3 Views Left Result Date: 12/29/2024 CLINICAL DATA:  LEFT shoulder and hip pain status post fall EXAM: DG HIP (WITH OR WITHOUT PELVIS) 2-3V LEFT COMPARISON:  None  available FINDINGS: Acute, mildly angulated, moderately displaced LEFT intertrochanteric femur fracture. Lumbar fusion hardware partially visualized. Mild diffuse osteopenia. Mild degenerative changes of the RIGHT hip. IMPRESSION: Acute, mildly angulated, moderately displaced LEFT intertrochanteric femur fracture. Electronically Signed   By: Aliene Lloyd M.D.   On: 12/29/2024 08:32   DG Knee 1-2 Views Left Result Date: 12/29/2024 CLINICAL DATA:  LEFT shoulder and hip pain status post fall EXAM: LEFT KNEE - 1-2 VIEW COMPARISON:  None available FINDINGS: Moderate joint space loss and spurring and chondrocalcinosis of the medial and lateral compartments. Mild joint space loss and spurring of the patellofemoral compartment. No fracture or dislocation. IMPRESSION: 1. No acute abnormality of the LEFT knee. 2. Moderate tricompartmental osteoarthrosis of the LEFT knee. Electronically Signed   By: Aliene Lloyd M.D.   On: 12/29/2024 08:30   DG Knee 1-2 Views Right Result Date: 12/29/2024 CLINICAL DATA:  LEFT shoulder and hip pain status post fall EXAM: RIGHT KNEE - 1-2 VIEW COMPARISON:  None available FINDINGS: RIGHT total knee prosthesis is well seated without periprosthetic fracture or lucency. No acute fracture or dislocation. Diffuse osteopenia. IMPRESSION: Uncomplicated RIGHT total knee prosthesis. No acute abnormality of the RIGHT knee. Electronically Signed   By: Farhaan  Mir M.D.   On: 12/29/2024 08:29   DG Ankle 2 Views Right Result Date: 12/29/2024 CLINICAL DATA:  LEFT shoulder and hip pain status post fall EXAM: RIGHT ANKLE - 2 VIEW COMPARISON:  None available FINDINGS: Enthesopathic changes are seen at the insertion of the plantar fascia and Achilles tendon on the calcaneus. Mild degenerative spurring seen at the tip of the lateral malleolus. No acute fracture or dislocation. Visualized soft tissues are unremarkable. IMPRESSION: No acute radiographic abnormality of the RIGHT ankle. Electronically Signed    By: Aliene Lloyd M.D.   On: 12/29/2024 08:28   DG Chest 1 View Result Date: 12/29/2024 CLINICAL DATA:  LEFT shoulder and hip pain status post fall EXAM: CHEST  1 VIEW COMPARISON:  10/08/2023 FINDINGS: The heart is moderately enlarged which appears increased since the prior chest radiograph. No pulmonary vascular congestion. Focal opacity in the LEFT lung base could relate to pneumonia or atelectasis. Lungs are otherwise clear. Known LEFT proximal humerus fracture better visualized on dedicated shoulder radiographs. IMPRESSION: 1. Interval worsening of moderate cardiomegaly. 2. Focal opacity in the LEFT lung base may be due to atelectasis or pneumonia. 3. Known LEFT proximal humerus fracture better seen on dedicated shoulder radiographs. Electronically Signed   By: Aliene Lloyd M.D.   On: 12/29/2024 08:26   DG Shoulder Left Result Date: 12/29/2024 CLINICAL DATA:  LEFT shoulder and hip pain status post fall EXAM: LEFT SHOULDER - 2+ VIEW COMPARISON:  None available FINDINGS: Mildly impacted and displaced fracture of the LEFT proximal humerus. Mild elevation of the clavicle relative the acromion measuring 9 mm, suspicious for [facility] joint separation. Diffuse osteopenia. Calcifications adjacent to the greater tuberosity consistent with calcific rotator cuff tendinosis. IMPRESSION: 1. Mildly impacted and displaced LEFT proximal humerus fracture. 2. Elevated LEFT clavicle relative the acromion, suspicious for [facility] joint separation. Electronically Signed   By: Aliene Lloyd M.D.   On: 12/29/2024 08:24    EKG: Personally reviewed by me which shows atrial fibrillation rate controlled.   Consultant: Orthopedics  Code Status: DNR  Microbiology none  Antibiotics: None  Family Communication:  Patients' condition and plan of care including tests being ordered have been discussed with the patient and the patient's daughters at bedside who indicate understanding and agree with the plan.   Status is:  Inpatient   Severity of Illness: The appropriate patient status for this patient is INPATIENT. Inpatient status is judged to be reasonable and necessary in order to provide the required intensity of service to ensure the patient's safety. The patient's presenting symptoms, physical exam findings, and initial radiographic and laboratory data in the context of their chronic comorbidities is felt to place them at high risk for further clinical deterioration. Furthermore, it is not anticipated that the patient will be medically stable for discharge from the hospital within 2 midnights of admission.   * I certify that at the point of admission it is my clinical judgment that the patient will require inpatient hospital care spanning beyond 2 midnights from the point of admission due to high intensity of service, high risk for further deterioration and high frequency of surveillance required.*  Signed, Vernal Alstrom, MD [facility] 12/29/2024         [1]  Allergies Allergen Reactions   Alendronate     Other Reaction(s): Unknown   Calcitonin Nausea And Vomiting   Gabapentin     Other Reaction(s): blurred vision   Hydrocodone -Acetaminophen      Other Reaction(s): Dizzy   Latex  Other Reaction(s): Unknown   Meperidine  Hcl Nausea And Vomiting   Oxycodone -Acetaminophen      Other Reaction(s): Unknown   Pravastatin     Other Reaction(s): Unknown   Raloxifene     Other Reaction(s): Unknown   Risedronate     Other Reaction(s): Unknown   Tramadol  Hcl     Other Reaction(s): does not feel right   Darvocet [Propoxyphene N-Acetaminophen ] Nausea And Vomiting   Demerol  Hives   Etodolac Hives and Rash    Unknown to patient   Hydrocodone  Itching   Meprozine [Meperidine -Promethazine]     Reaction=tongue swelling   Penicillins Rash   Percocet [Oxycodone -Acetaminophen ] Nausea And Vomiting    Dizziness    Ultracet [Tramadol -Acetaminophen ] Other (See Comments)    headache   "

## 2024-12-30 ENCOUNTER — Inpatient Hospital Stay (HOSPITAL_COMMUNITY)

## 2024-12-30 ENCOUNTER — Encounter (HOSPITAL_COMMUNITY): Payer: Self-pay | Admitting: Student

## 2024-12-30 DIAGNOSIS — S72002A Fracture of unspecified part of neck of left femur, initial encounter for closed fracture: Secondary | ICD-10-CM | POA: Diagnosis not present

## 2024-12-30 LAB — BASIC METABOLIC PANEL WITH GFR
Anion gap: 10 (ref 5–15)
BUN: 25 mg/dL — ABNORMAL HIGH (ref 8–23)
CO2: 24 mmol/L (ref 22–32)
Calcium: 8.8 mg/dL — ABNORMAL LOW (ref 8.9–10.3)
Chloride: 104 mmol/L (ref 98–111)
Creatinine, Ser: 0.92 mg/dL (ref 0.44–1.00)
GFR, Estimated: 56 mL/min — ABNORMAL LOW
Glucose, Bld: 131 mg/dL — ABNORMAL HIGH (ref 70–99)
Potassium: 4.6 mmol/L (ref 3.5–5.1)
Sodium: 138 mmol/L (ref 135–145)

## 2024-12-30 LAB — CBC
HCT: 28.2 % — ABNORMAL LOW (ref 36.0–46.0)
Hemoglobin: 9.4 g/dL — ABNORMAL LOW (ref 12.0–15.0)
MCH: 32.2 pg (ref 26.0–34.0)
MCHC: 33.3 g/dL (ref 30.0–36.0)
MCV: 96.6 fL (ref 80.0–100.0)
Platelets: 112 K/uL — ABNORMAL LOW (ref 150–400)
RBC: 2.92 MIL/uL — ABNORMAL LOW (ref 3.87–5.11)
RDW: 12.5 % (ref 11.5–15.5)
WBC: 8.5 K/uL (ref 4.0–10.5)
nRBC: 0 % (ref 0.0–0.2)

## 2024-12-30 LAB — MAGNESIUM: Magnesium: 1.7 mg/dL (ref 1.7–2.4)

## 2024-12-30 MED ORDER — OYSTER SHELL CALCIUM/D3 500-5 MG-MCG PO TABS
1.0000 | ORAL_TABLET | Freq: Every day | ORAL | Status: DC
  Administered 2024-12-30 – 2025-01-03 (×5): 1 via ORAL
  Filled 2024-12-30 (×5): qty 1

## 2024-12-30 MED ORDER — CELECOXIB 200 MG PO CAPS
200.0000 mg | ORAL_CAPSULE | Freq: Two times a day (BID) | ORAL | Status: DC
  Administered 2024-12-30 – 2024-12-31 (×2): 200 mg via ORAL
  Filled 2024-12-30 (×2): qty 1

## 2024-12-30 MED ORDER — BACITRACIN ZINC 500 UNIT/GM EX OINT
1.0000 | TOPICAL_OINTMENT | Freq: Every day | CUTANEOUS | Status: DC
  Administered 2024-12-30 – 2025-01-03 (×5): 1 via TOPICAL
  Filled 2024-12-30: qty 28.4

## 2024-12-30 MED ORDER — METOPROLOL SUCCINATE ER 25 MG PO TB24
37.5000 mg | ORAL_TABLET | Freq: Every day | ORAL | Status: DC
  Administered 2024-12-30 – 2025-01-02 (×4): 37.5 mg via ORAL
  Filled 2024-12-30 (×4): qty 2

## 2024-12-30 MED ORDER — CELECOXIB 200 MG PO CAPS
200.0000 mg | ORAL_CAPSULE | Freq: Every day | ORAL | Status: DC
  Administered 2024-12-30: 200 mg via ORAL
  Filled 2024-12-30: qty 1

## 2024-12-30 MED ORDER — SENNA 8.6 MG PO TABS
1.0000 | ORAL_TABLET | Freq: Every day | ORAL | Status: DC
  Administered 2024-12-30 – 2025-01-03 (×5): 8.6 mg via ORAL
  Filled 2024-12-30 (×5): qty 1

## 2024-12-30 MED ORDER — ACETAMINOPHEN 325 MG PO TABS
650.0000 mg | ORAL_TABLET | Freq: Four times a day (QID) | ORAL | Status: DC
  Administered 2024-12-30 – 2025-01-03 (×10): 650 mg via ORAL
  Filled 2024-12-30 (×11): qty 2

## 2024-12-30 MED ORDER — MORPHINE SULFATE (PF) 2 MG/ML IV SOLN
2.0000 mg | INTRAVENOUS | Status: DC | PRN
  Filled 2024-12-30: qty 1

## 2024-12-30 MED ORDER — OXYCODONE HCL 5 MG PO TABS: 5.0000 mg | ORAL_TABLET | ORAL | Status: DC | PRN

## 2024-12-30 MED ORDER — VITAMIN D 25 MCG (1000 UNIT) PO TABS
1000.0000 [IU] | ORAL_TABLET | Freq: Every day | ORAL | Status: DC
  Administered 2024-12-30 – 2025-01-03 (×5): 1000 [IU] via ORAL
  Filled 2024-12-30 (×5): qty 1

## 2024-12-30 MED ORDER — APIXABAN 2.5 MG PO TABS
2.5000 mg | ORAL_TABLET | Freq: Two times a day (BID) | ORAL | Status: DC
  Administered 2024-12-30 – 2025-01-03 (×9): 2.5 mg via ORAL
  Filled 2024-12-30 (×9): qty 1

## 2024-12-30 MED ORDER — CALCIUM CARBONATE-VITAMIN D3 600-400 MG-UNIT PO TABS: 1.0000 | ORAL_TABLET | Freq: Every day | ORAL | Status: DC

## 2024-12-30 MED ORDER — METOPROLOL SUCCINATE ER 25 MG PO TB24: 37.5000 mg | ORAL_TABLET | Freq: Every day | ORAL | Status: DC

## 2024-12-30 NOTE — Evaluation (Signed)
 Occupational Therapy Evaluation Patient Details Name: Jessica Berg MRN: [id number] DOB: 04/04/1924 Today's Date: 12/30/2024   History of Present Illness   89 y.o. F adm 12/29/24 after ground-level fall sustaining left intertrochanteric femur fracture and left proximal humerus fracture. CT head/cervical spine scan was negative for acute findings. Pt s/p cephalomedullary nailing of LLE. PMHx: A-fib, HTN, syncope, CKD II, GERD, HLD, OA, and osteoporosis.     Clinical Impressions Pt resting in bed comfortably, several family members present during session, co-treat with PT. Pt c/o significant pain to LLE with activity. Pt lives alone, has PCA 2X a day, family in/out of home each day, always someone around during waking hours. PLOF needs help with dressing/bathing, ambulates with rollator, family/PCA assist with all IADLs. Pt currently NWB to LUE, WBAT to LLE, limited significantly by pain in LLE with any movement. Pt requires significant more assistance for all ADLs and mobility, max A x2 assist to EOB, max A x2 HHA for STS with posterior lean, Pt not able to correct posterior lean when standing. Pt able to take small side steps to the R easier than the L. Pt does not tolerate standing for more than 30-45 seconds at a time. Pt total A for return to supine, Pt was wiped out from activities for several minutes after returning to bed. Pt would benefit from continued acute OT to maximize functional strength and manage pain levels while increasing participation in ADLs. DC to postacute rehab <3hrs/day recommended.      If plan is discharge home, recommend the following:   Two people to help with walking and/or transfers;A lot of help with bathing/dressing/bathroom;Assistance with cooking/housework;Assist for transportation;Help with stairs or ramp for entrance;Assistance with feeding     Functional Status Assessment   Patient has had a recent decline in their functional status and demonstrates the  ability to make significant improvements in function in a reasonable and predictable amount of time.     Equipment Recommendations   Other (comment) (defer)     Recommendations for Other Services         Precautions/Restrictions   Precautions Precautions: Fall Recall of Precautions/Restrictions: Intact Restrictions Weight Bearing Restrictions Per Provider Order: Yes LUE Weight Bearing Per Provider Order: Non weight bearing LLE Weight Bearing Per Provider Order: Weight bearing as tolerated     Mobility Bed Mobility Overal bed mobility: Needs Assistance Bed Mobility: Supine to Sit, Sit to Supine     Supine to sit: Max assist, +2 for physical assistance, HOB elevated Sit to supine: Total assist, +2 for physical assistance   General bed mobility comments: max A assist OOB, significant pain, assistance for LLE and sitting up. total A assist for BLEs and trunk for return to bed    Transfers Overall transfer level: Needs assistance Equipment used: 2 person hand held assist Transfers: Sit to/from Stand Sit to Stand: Max assist, +2 physical assistance, +2 safety/equipment           General transfer comment: max A x2 HHA for STS, Pt with significant pain, posterior lean, max A x2 to maintain standing balance, not able to correct posterior lean. Able to take side steps to R easier than to L side.      Balance Overall balance assessment: Needs assistance Sitting-balance support: Single extremity supported, Feet supported Sitting balance-Leahy Scale: Fair Sitting balance - Comments: once positioned to EOB comfortably, able to sit with CGA. Postural control: Posterior lean Standing balance support: Bilateral upper extremity supported, During functional activity, Reliant on  assistive device for balance Standing balance-Leahy Scale: Poor Standing balance comment: reliant on max A x2 HHA, not able to correct posterior lean, very poor standing balance.                            ADL either performed or assessed with clinical judgement   ADL Overall ADL's : Needs assistance/impaired Eating/Feeding: Minimal assistance;Sitting   Grooming: Minimal assistance;Sitting   Upper Body Bathing: Maximal assistance;Sitting   Lower Body Bathing: Total assistance;Sitting/lateral leans   Upper Body Dressing : Maximal assistance;Sitting   Lower Body Dressing: Total assistance;Sitting/lateral leans   Toilet Transfer: Maximal assistance;Total assistance;+2 for physical assistance;BSC/3in1   Toileting- Clothing Manipulation and Hygiene: Maximal assistance;Sitting/lateral lean         General ADL Comments: Pt limited due to LUE in sling, NWB. Pt RUE fair overall strength/ROM, able to sit EOB, not standing without max A x2 support for STS and balance.     Vision         Perception         Praxis         Pertinent Vitals/Pain Pain Assessment Pain Assessment: Faces Faces Pain Scale: Hurts whole lot Pain Location: LLE with activity Pain Descriptors / Indicators: Aching, Discomfort, Grimacing, Moaning Pain Intervention(s): Monitored during session     Extremity/Trunk Assessment Upper Extremity Assessment Upper Extremity Assessment: LUE deficits/detail LUE Deficits / Details: L proximal humerus fx, nonoperative currently, sling for comfort, not fully assessed but Pt is able to flex/extend at elbow when don/doff sling. no numbness/tingling LUE: Shoulder pain with ROM LUE Sensation: WNL LUE Coordination: decreased gross motor   Lower Extremity Assessment Lower Extremity Assessment: Defer to PT evaluation       Communication Communication Communication: No apparent difficulties   Cognition Arousal: Alert Behavior During Therapy: WFL for tasks assessed/performed Cognition: Cognition impaired   Orientation impairments: Time         OT - Cognition Comments: Grossly WFLs, pleasant and conversational, not oriented to day/year. Pt  participates and follows commands as needed                 Following commands: Intact       Cueing  General Comments   Cueing Techniques: Verbal cues;Gestural cues;Tactile cues;Visual cues      Exercises     Shoulder Instructions      Home Living Family/patient expects to be discharged to:: Private residence Living Arrangements: Alone Available Help at Discharge: Family Type of Home: House Home Access: Stairs to enter;Ramped entrance Entrance Stairs-Number of Steps: 1 step with ramp Entrance Stairs-Rails: Left;Right Home Layout: One level     Bathroom Shower/Tub: Runner, Broadcasting/film/video: Educational Psychologist (4 wheels);Rolling Walker (2 wheels);Cane - single point;Toilet riser;Grab bars - tub/shower   Additional Comments: Pt has PCA 9-12, and 5-8, 3-7 on weekends, children helps during other times. Pt has help during waking hours only.      Prior Functioning/Environment Prior Level of Function : Needs assist             Mobility Comments: Pt uses rollator, no other recent falls ADLs Comments: daughter and PCA helps with dressing, bathing daily.    OT Problem List: Decreased strength;Decreased range of motion;Decreased activity tolerance;Impaired balance (sitting and/or standing);Impaired UE functional use;Pain   OT Treatment/Interventions: Self-care/ADL training;Therapeutic exercise;DME and/or AE instruction;Energy conservation;Therapeutic activities;Patient/family education;Balance training      OT Goals(Current  goals can be found in the care plan section)   Acute Rehab OT Goals Patient Stated Goal: to manage pain OT Goal Formulation: With patient/family Time For Goal Achievement: 01/13/25 Potential to Achieve Goals: Good   OT Frequency:  Min 2X/week    Co-evaluation PT/OT/SLP Co-Evaluation/Treatment: Yes Reason for Co-Treatment: Complexity of the patient's impairments (multi-system involvement);For patient/therapist  safety;To address functional/ADL transfers   OT goals addressed during session: Proper use of Adaptive equipment and DME;ADL's and self-care      AM-PAC OT 6 Clicks Daily Activity     Outcome Measure Help from another person eating meals?: A Little Help from another person taking care of personal grooming?: A Little Help from another person toileting, which includes using toliet, bedpan, or urinal?: A Lot Help from another person bathing (including washing, rinsing, drying)?: A Lot Help from another person to put on and taking off regular upper body clothing?: A Lot Help from another person to put on and taking off regular lower body clothing?: Total 6 Click Score: 13   End of Session Equipment Utilized During Treatment: Gait belt Nurse Communication: Mobility status  Activity Tolerance: Patient limited by pain Patient left: in bed;with call bell/phone within reach;with bed alarm set;with family/visitor present;with nursing/sitter in room  OT Visit Diagnosis: Unsteadiness on feet (R26.81);Other abnormalities of gait and mobility (R26.89);Muscle weakness (generalized) (M62.81);Pain Pain - Right/Left: Left Pain - part of body: Hip;Shoulder                Time: 8686-8644 OT Time Calculation (min): 42 min Charges:  OT General Charges $OT Visit: 1 Visit OT Evaluation $OT Eval Moderate Complexity: 1 Mod OT Treatments $Self Care/Home Management : 8-22 mins  [place], OTR/L   Elouise JONELLE Bott 12/30/2024, 2:53 PM

## 2024-12-30 NOTE — Evaluation (Signed)
 Physical Therapy Evaluation Patient Details Name: Jessica Berg MRN: [id number] DOB: [birth year] Today's Date: 12/30/2024  History of Present Illness  89 y.o. F adm 12/29/24 after ground-level fall sustaining left intertrochanteric femur fracture and left proximal humerus fracture. CT head/cervical spine scan was negative for acute findings. Pt s/p cephalomedullary nailing of LLE. PMHx: A-fib, HTN, syncope, CKD II, GERD, HLD, OA, and osteoporosis.   Clinical Impression  Pt admitted with above diagnosis. PTA, pt was modI for functional mobility using rollator. She lives alone in a one story house with a ramp and 1 STE. Family reports being frequently in/out of the house to check in on the pt. Additionally, she has two aids during the day, but is alone at night. Pt currently with functional limitations due to the deficits listed below (see PT Problem List). She required max-totalA x2 for bed mobility, maxA x2 for sit<>stand, and maxA x2 to take a couple steps along EOB before fatiguing. Pt is currently limited by pain, weight bearing status (LUE NWB; LLE WBAT), impaired balance, and decreased activity tolerance. Pt will benefit from acute skilled PT to increase their independence and safety with mobility to allow discharge. Recommend continued inpatient follow up therapy, <3 hours/day.    If plan is discharge home, recommend the following: Two people to help with walking and/or transfers;A lot of help with bathing/dressing/bathroom;Assistance with cooking/housework;Assist for transportation;Help with stairs or ramp for entrance   Can travel by private vehicle   No    Equipment Recommendations Wheelchair (measurements PT);Wheelchair cushion (measurements PT);BSC/3in1  Recommendations for Other Services       Functional Status Assessment Patient has had a recent decline in their functional status and demonstrates the ability to make significant improvements in function in a reasonable and  predictable amount of time.     Precautions / Restrictions Precautions Precautions: Fall Recall of Precautions/Restrictions: Impaired Required Braces or Orthoses: Sling (RUE) Restrictions Weight Bearing Restrictions Per Provider Order: Yes LUE Weight Bearing Per Provider Order: Non weight bearing LLE Weight Bearing Per Provider Order: Weight bearing as tolerated Other Position/Activity Restrictions: LLE: unrestricted ROM; LUE: okay for elbow and wrist motion      Mobility  Bed Mobility Overal bed mobility: Needs Assistance Bed Mobility: Supine to Sit, Sit to Supine     Supine to sit: Max assist, +2 for physical assistance, HOB elevated Sit to supine: Total assist, +2 for physical assistance   General bed mobility comments: Sat up on L side of bed with increased time and effort. Significant pain. Assist to manage LHB. Returning to bed, total A +2 d/t pain and fatigue. Repositioned using bed features.    Transfers Overall transfer level: Needs assistance Equipment used: 1 person hand held assist Transfers: Sit to/from Stand Sit to Stand: Max assist, +2 physical assistance, +2 safety/equipment           General transfer comment: Pt stood from lowest bed height. Cued pt to increase fwd lean and push down with RUE. Powered up with maxA x2. Pt with significant pain and posterior lean. Deferred transfer to reclier chair d/t worsening pain and fatigue.    Ambulation/Gait             Pre-gait activities: Pt engaged in a few lateral side steps along EOB with maxA x2 to maintain stability and RUE HHA. She demonstrated a posterior lean with inability to correct. Pt moved to the right easier than the left.    Stairs  Wheelchair Mobility     Tilt Bed    Modified Rankin (Stroke Patients Only)       Balance Overall balance assessment: Needs assistance Sitting-balance support: Single extremity supported, Feet supported Sitting balance-Leahy Scale:  Fair Sitting balance - Comments: once positioned to EOB comfortably, able to sit with CGA. Postural control: Posterior lean Standing balance support: Bilateral upper extremity supported, During functional activity, Reliant on assistive device for balance Standing balance-Leahy Scale: Poor Standing balance comment: reliant on max A x2 HHA, not able to correct posterior lean, very poor standing balance.                             Pertinent Vitals/Pain Pain Assessment Pain Assessment: Faces Faces Pain Scale: Hurts whole lot Pain Location: LUE and LLE with activity Pain Descriptors / Indicators: Aching, Discomfort, Grimacing, Moaning Pain Intervention(s): Monitored during session, Limited activity within patient's tolerance, Repositioned, Patient requesting pain meds-RN notified    Home Living Family/patient expects to be discharged to:: Private residence Living Arrangements: Alone Available Help at Discharge: Family;Personal care attendant;Available PRN/intermittently (Pt has PCA 9-12, and 5-8, 3-7 on weekends, children help during other times. Family report not 24/7. Pt has help during waking hours only, she is alone from 8pm-9am) Type of Home: House Home Access: Stairs to enter;Ramped entrance Entrance Stairs-Rails: Left;Right Entrance Stairs-Number of Steps: 1 step with ramp   Home Layout: One level Home Equipment: Educational Psychologist (4 wheels);Rolling Walker (2 wheels);Cane - single point;Toilet riser;Grab bars - tub/shower Additional Comments: 2 daughters have access to a w/c that the pt could use.    Prior Function Prior Level of Function : Needs assist       Physical Assist : ADLs (physical)   ADLs (physical): Bathing;Dressing Mobility Comments: Ambulates using rollator. 1 fall leading to current admission. ADLs Comments: daughter and PCA helps with dressing, bathing daily. manages her own medication. Family provides transportation.     Extremity/Trunk  Assessment   Upper Extremity Assessment Upper Extremity Assessment: Defer to OT evaluation LUE Deficits / Details: L proximal humerus fx, nonoperative currently, sling for comfort, not fully assessed but Pt is able to flex/extend at elbow when don/doff sling. no numbness/tingling LUE: Shoulder pain with ROM LUE Sensation: WNL LUE Coordination: decreased gross motor    Lower Extremity Assessment Lower Extremity Assessment: LLE deficits/detail LLE Deficits / Details: Pt POD 1 s/p cephalomedullary nailing of femur. Decreased hip and knee AROM. Pt resistant to PROM attempts. Decreased strength, grossly 2+/5. Ankle WFL. LLE: Unable to fully assess due to pain LLE Coordination: decreased gross motor    Cervical / Trunk Assessment Cervical / Trunk Assessment: Kyphotic  Communication   Communication Communication: No apparent difficulties    Cognition Arousal: Alert Behavior During Therapy: WFL for tasks assessed/performed, Anxious   PT - Cognitive impairments: Orientation, Sequencing, Initiation, Problem solving, Safety/Judgement   Orientation impairments: Time (Pt unable to state the date)                   PT - Cognition Comments: Pt pleasant and conversational. Her family provide majority of home information and PLOF. She appears to have delayed processing. Pt nervous to mobilize. She was fixated on the pain. Following commands: Impaired Following commands impaired: Follows one step commands with increased time, Follows multi-step commands inconsistently     Cueing Cueing Techniques: Verbal cues, Gestural cues, Tactile cues, Visual cues     General Comments General comments (skin integrity,  edema, etc.): Readjusted shoulder sling. 3 daughters present and supportive throughout session.    Exercises     Assessment/Plan    PT Assessment Patient needs continued PT services  PT Problem List Decreased strength;Decreased range of motion;Decreased activity  tolerance;Decreased balance;Decreased mobility;Decreased cognition;Decreased knowledge of use of DME;Decreased safety awareness;Pain       PT Treatment Interventions DME instruction;Gait training;Stair training;Functional mobility training;Therapeutic activities;Therapeutic exercise;Balance training;Cognitive remediation;Patient/family education;Wheelchair mobility training    PT Goals (Current goals can be found in the Care Plan section)  Acute Rehab PT Goals Patient Stated Goal: Return Home PT Goal Formulation: With patient/family Time For Goal Achievement: 01/13/25 Potential to Achieve Goals: Fair    Frequency Min 2X/week     Co-evaluation   Reason for Co-Treatment: Complexity of the patient's impairments (multi-system involvement);For patient/therapist safety;To address functional/ADL transfers PT goals addressed during session: Mobility/safety with mobility;Balance;Proper use of DME OT goals addressed during session: Proper use of Adaptive equipment and DME;ADL's and self-care       AM-PAC PT 6 Clicks Mobility  Outcome Measure Help needed turning from your back to your side while in a flat bed without using bedrails?: Total Help needed moving from lying on your back to sitting on the side of a flat bed without using bedrails?: Total Help needed moving to and from a bed to a chair (including a wheelchair)?: Total Help needed standing up from a chair using your arms (e.g., wheelchair or bedside chair)?: Total Help needed to walk in hospital room?: Total Help needed climbing 3-5 steps with a railing? : Total 6 Click Score: 6    End of Session Equipment Utilized During Treatment: Gait belt Activity Tolerance: Patient limited by pain;Patient limited by fatigue Patient left: in bed;with call bell/phone within reach;with bed alarm set;with family/visitor present Nurse Communication: Mobility status;Need for lift equipment;Patient requests pain meds PT Visit Diagnosis:  Difficulty in walking, not elsewhere classified (R26.2);Other abnormalities of gait and mobility (R26.89);Unsteadiness on feet (R26.81);History of falling (Z91.81);Pain Pain - Right/Left: Left Pain - part of body: Shoulder;Hip    Time: 8686-8643 PT Time Calculation (min) (ACUTE ONLY): 43 min   Charges:   PT Evaluation $PT Eval Moderate Complexity: 1 Mod PT Treatments $Therapeutic Activity: 8-22 mins PT General Charges $$ ACUTE PT VISIT: 1 Visit         Randall SAUNDERS, PT, DPT Acute Rehabilitation Services Office: [phone] Secure Chat Preferred  Delon CHRISTELLA Callander 12/30/2024, 3:28 PM

## 2024-12-30 NOTE — Progress Notes (Signed)
 [address]  Jessica Berg  FMW:[id number] DOB: 1924/11/18 DOA: 12/29/2024 PCP: Charlott Dorn LABOR, MD    Brief Narrative:  89 year old with a history of atrial fibrillation on Eliquis , CKD stage II, GERD, HLD, OA, and osteoporosis who presented to the ER 1/14 after suffering a mechanical fall without loss of consciousness.  She had immediate left hip and shoulder pain.  In the ER x-rays of the left shoulder revealed an impacted and displaced fracture of the left proximal humerus.  Pelvis x-rays revealed an acute mildly angulated moderately displaced left intertrochanteric femur fracture.  CT head and cervical spine was without acute findings.  CT abdomen and pelvis was without acute findings.  Goals of Care:   Code Status: Limited: Do not attempt resuscitation (DNR) -DNR-LIMITED -Do Not Intubate/DNI    DVT prophylaxis: apixaban  (ELIQUIS ) tablet 2.5 mg Start: 12/30/24 1000 SCDs Start: 12/29/24 1650 apixaban  (ELIQUIS ) tablet 2.5 mg   Interim Hx: Afebrile since admission.  Vital signs stable.  Heart rate controlled.  Is resting comfortably in bed at the time of visit.  Remarkably has no complaints.  Is alert conversant and quite pleasant.  Assessment & Plan:  Displaced left intertrochanteric femur fracture status post mechanical fall Status post cephalomedullary nailing 1/14 afternoon - postoperative care per orthopedics  Left proximal humerus fracture status post mechanical fall Care as per orthopedics with nonoperative management -to utilize sling with nonweightbearing precautions  Chronic atrial fibrillation on anticoagulation Eliquis  being resumed today -heart rate controlled  CKD stage II Creatinine at baseline of approximately 0.8  GERD Continue usual Protonix  Family Communication: Spoke with multiple children at bedside Disposition: Depends upon performance with PT/OT   Objective: Blood pressure 112/66, pulse 96, temperature 98.5 F (36.9 C), temperature source Oral, resp.  rate 16, height 5' (1.524 m), weight 49.9 kg, SpO2 99%.  Intake/Output Summary (Last 24 hours) at 12/30/2024 0939 Last data filed at 12/30/2024 0615 Gross per 24 hour  Intake 1196.8 ml  Output 950 ml  Net 246.8 ml   Filed Weights   12/29/24 0709 12/29/24 1349  Weight: 49.9 kg 49.9 kg    Examination: General: No acute respiratory distress Lungs: Clear to auscultation bilaterally without wheezes or crackles Cardiovascular: Regular rate and rhythm without murmur gallop or rub normal S1 and S2 Abdomen: Nontender, nondistended, soft, bowel sounds positive, no rebound, no ascites, no appreciable mass Extremities: No significant cyanosis, clubbing, or edema bilateral lower extremities  CBC: Recent Labs  Lab 12/29/24 0830 12/29/24 0839 12/30/24 0531  WBC 9.4  --  8.5  NEUTROABS 8.3*  --   --   HGB 11.9* 12.2 9.4*  HCT 37.3 36.0 28.2*  MCV 98.4  --  96.6  PLT 137*  --  112*   Basic Metabolic Panel: Recent Labs  Lab 12/29/24 0830 12/29/24 0839 12/30/24 0531  NA 142 142 138  K 4.2 4.1 4.6  CL 105 103 104  CO2 27  --  24  GLUCOSE 123* 120* 131*  BUN 30* 32* 25*  CREATININE 0.87 0.90 0.92  CALCIUM  9.3  --  8.8*  MG  --   --  1.7   GFR: Estimated Creatinine Clearance: 24.5 mL/min (by C-G formula based on SCr of 0.92 mg/dL).   Scheduled Meds:  apixaban   2.5 mg Oral BID   ascorbic acid   500 mg Oral Daily   docusate sodium   100 mg Oral BID   metoprolol  succinate  37.5 mg Oral Daily   sodium chloride  flush  3 mL Intravenous  Q12H   Continuous Infusions:  sodium chloride  50 mL/hr at 12/29/24 1741   sodium chloride       ceFAZolin  (ANCEF ) IV 2 g (12/30/24 0615)     LOS: 1 day   Reyes IVAR Moores, MD [facility] Office  [phone] Pager - Text Page per Tracey  If 7PM-7AM, please contact night-coverage per Amion 12/30/2024, 9:39 AM     "

## 2024-12-30 NOTE — TOC CAGE-AID Note (Signed)
 Transition of Care [facility]) - CAGE-AID Screening   Patient Details  Name: Jessica Berg MRN: [id number] Date of Birth: 1924-11-17  Transition of Care [facility]) CM/SW Contact:    Dempsy Damiano E Ebelyn Bohnet, LCSW Phone Number: 12/30/2024, 9:49 AM   Clinical Narrative: Disoriented   CAGE-AID Screening: Substance Abuse Screening unable to be completed due to: : Patient unable to participate

## 2024-12-30 NOTE — Discharge Instructions (Signed)
 "  Orthopaedic Trauma Service Discharge Instructions   General Discharge Instructions  WEIGHT BEARING STATUS: Weightbearing as tolerated left leg. Non-weightbearing left arm  RANGE OF MOTION/ACTIVITY: Left leg - Unrestricted range of motion Left arm - Avoid any shoulder range of motion. Ok for elbow and wrist motion as tolerated. You may remove sling for bathing  Wound Care: You may remove your surgical dressings from the left hip on post op day 2 (Friday 12/31/24). Incisions can be left open to air if there is no drainage. Once the incision is completely dry and without drainage, it may be left open to air out.  Showering may begin post op day 3 (Saturday 01/01/25).  Clean incision gently with soap and water.  DVT/PE prophylaxis: Eliquis   Diet: as you were eating previously.  Can use over the counter stool softeners and bowel preparations, such as Miralax , to help with bowel movements.  Narcotics can be constipating.  Be sure to drink plenty of fluids  PAIN MEDICATION USE AND EXPECTATIONS  You have likely been given narcotic medications to help control your pain.  After a traumatic event that results in an fracture (broken bone) with or without surgery, it is ok to use narcotic pain medications to help control one's pain.  We understand that everyone responds to pain differently and each individual patient will be evaluated on a regular basis for the continued need for narcotic medications. Ideally, narcotic medication use should last no more than 6-8 weeks (coinciding with fracture healing).   As a patient it is your responsibility as well to monitor narcotic medication use and report the amount and frequency you use these medications when you come to your office visit.   We would also advise that if you are using narcotic medications, you should take a dose prior to therapy to maximize you participation.  IF YOU ARE ON NARCOTIC MEDICATIONS IT IS NOT PERMISSIBLE TO OPERATE A MOTOR VEHICLE  (MOTORCYCLE/CAR/TRUCK/MOPED) OR HEAVY MACHINERY DO NOT MIX NARCOTICS WITH OTHER CNS (CENTRAL NERVOUS SYSTEM) DEPRESSANTS SUCH AS ALCOHOL  POST-OPERATIVE OPIOID TAPER INSTRUCTIONS: It is important to wean off of your opioid medication as soon as possible. If you do not need pain medication after your surgery it is ok to stop day one. Opioids include: Codeine, Hydrocodone (Norco, Vicodin), Oxycodone (Percocet, oxycontin ) and hydromorphone amongst others.  Long term and even short term use of opiods can cause: Increased pain response Dependence Constipation Depression Respiratory depression And more.  Withdrawal symptoms can include Flu like symptoms Nausea, vomiting And more Techniques to manage these symptoms Hydrate well Eat regular healthy meals Stay active Use relaxation techniques(deep breathing, meditating, yoga) Do Not substitute Alcohol to help with tapering If you have been on opioids for less than two weeks and do not have pain than it is ok to stop all together.  Plan to wean off of opioids This plan should start within one week post op of your fracture surgery  Maintain the same interval or time between taking each dose and first decrease the dose.  Cut the total daily intake of opioids by one tablet each day Next start to increase the time between doses. The last dose that should be eliminated is the evening dose.    STOP SMOKING OR USING NICOTINE PRODUCTS!!!!  As discussed nicotine severely impairs your body's ability to heal surgical and traumatic wounds but also impairs bone healing.  Wounds and bone heal by forming microscopic blood vessels (angiogenesis) and nicotine is a vasoconstrictor (essentially, shrinks blood vessels).  Therefore, if vasoconstriction occurs to these microscopic blood vessels they essentially disappear and are unable to deliver necessary nutrients to the healing tissue.  This is one modifiable factor that you can do to dramatically increase your  chances of healing your injury.  (This means no smoking, no nicotine gum, patches, etc)  DO NOT USE NONSTEROIDAL ANTI-INFLAMMATORY DRUGS (NSAID'S)  Using products such as Advil (ibuprofen), Aleve (naproxen), Motrin (ibuprofen) for additional pain control during fracture healing can delay and/or prevent the healing response.  If you would like to take over the counter (OTC) medication, Tylenol  (acetaminophen ) is ok.  However, some narcotic medications that are given for pain control contain acetaminophen  as well. Therefore, you should not exceed more than 4000 mg of tylenol  in a day if you do not have liver disease.  Also note that there are may OTC medicines, such as cold medicines and allergy medicines that my contain tylenol  as well.  If you have any questions about medications and/or interactions please ask your doctor/PA or your pharmacist.      ICE AND ELEVATE INJURED/OPERATIVE EXTREMITY  Using ice and elevating the injured extremity above your heart can help with swelling and pain control.  Icing in a pulsatile fashion, such as 20 minutes on and 20 minutes off, can be followed.    Do not place ice directly on skin. Make sure there is a barrier between to skin and the ice pack.    Using frozen items such as frozen peas works well as the conform nicely to the are that needs to be iced.  USE AN ACE WRAP OR TED HOSE FOR SWELLING CONTROL  In addition to icing and elevation, Ace wraps or TED hose are used to help limit and resolve swelling.  It is recommended to use Ace wraps or TED hose until you are informed to stop.    When using Ace Wraps start the wrapping distally (farthest away from the body) and wrap proximally (closer to the body)   Example: If you had surgery on your leg or thing and you do not have a splint on, start the ace wrap at the toes and work your way up to the thigh        If you had surgery on your upper extremity and do not have a splint on, start the ace wrap at your fingers and  work your way up to the upper arm    CALL THE OFFICE FOR MEDICATION REFILLS OR WITH ANY QUESTIONS/CONCERNS: [phone]   VISIT OUR WEBSITE FOR ADDITIONAL INFORMATION: [email or web address]   Discharge Wound Care Instructions  Do NOT apply any ointments, solutions or lotions to pin sites or surgical wounds.  These prevent needed drainage and even though solutions like hydrogen peroxide kill bacteria, they also damage cells lining the pin sites that help fight infection.  Applying lotions or ointments can keep the wounds moist and can cause them to breakdown and open up as well. This can increase the risk for infection. When in doubt call the office.  Surgical incisions should be dressed daily.  If any drainage is noted, use one layer of adaptic or Mepitel, then gauze, Kerlix, and an ace wrap. - These dressing supplies should be available at local medical supply stores ([facility], [facility], etc) as well as insurance claims handler ([facility], [facility], [place], etc)  Once the incision is completely dry and without drainage, it may be left open to air out.  Showering may begin 36-48 hours later.  Cleaning gently  with soap and water.  Traumatic wounds should be dressed daily as well.    One layer of adaptic, gauze, Kerlix, then ace wrap.  The adaptic can be discontinued once the draining has ceased    If you have a wet to dry dressing: wet the gauze with saline the squeeze as much saline out so the gauze is moist (not soaking wet), place moistened gauze over wound, then place a dry gauze over the moist one, followed by Kerlix wrap, then ace wrap.    Call office for the following: Temperature greater than 101F Persistent nausea and vomiting Severe uncontrolled pain Redness, tenderness, or signs of infection (pain, swelling, redness, odor or green/yellow discharge around the site) Difficulty breathing, headache or visual disturbances Hives Persistent dizziness or light-headedness Extreme  fatigue Any other questions or concerns you may have after discharge  In an emergency, call 911 or go to an Emergency Department at a nearby hospital  OTHER HELPFUL INFORMATION You should wean off your narcotic medicines as soon as you are able.  Most patients will be off or using minimal narcotics before their first postop appointment.   We suggest you use the pain medication the first night prior to going to bed, in order to ease any pain when the anesthesia wears off. You should avoid taking pain medications on an empty stomach as it will make you nauseous.  Do not drink alcoholic beverages or take illicit drugs when taking pain medications.  In most states it is against the law to drive while you are in a splint or sling.  And certainly against the law to drive while taking narcotics.  You may return to work/school in the next couple of days when you feel up to it.   Pain medication may make you constipated.  Below are a few solutions to try in this order: Decrease the amount of pain medication if you arent having pain. Drink lots of decaffeinated fluids. Drink prune juice and/or each dried prunes  If the first 3 dont work start with additional solutions Take Colace - an over-the-counter stool softener Take Senokot - an over-the-counter laxative Take Miralax  - a stronger over-the-counter laxative     "

## 2024-12-30 NOTE — Anesthesia Postprocedure Evaluation (Signed)
"   Anesthesia Post Note  Patient: Jessica Berg  Procedure(s) Performed: FIXATION, FRACTURE, INTERTROCHANTERIC, WITH INTRAMEDULLARY ROD (Left: Hip)     Patient location during evaluation: PACU Anesthesia Type: General Level of consciousness: awake and alert Pain management: pain level controlled Vital Signs Assessment: post-procedure vital signs reviewed and stable Respiratory status: spontaneous breathing, nonlabored ventilation, respiratory function stable and patient connected to nasal cannula oxygen Cardiovascular status: blood pressure returned to baseline and stable Postop Assessment: no apparent nausea or vomiting Anesthetic complications: no   No notable events documented.  Last Vitals:  Vitals:   12/30/24 0500 12/30/24 0752  BP: 119/67 112/66  Pulse: 71 96  Resp: 18 16  Temp: 37 C 36.9 C  SpO2: 96% 99%    Last Pain:  Vitals:   12/30/24 0752  TempSrc: Oral  PainSc:                  Gearl Baratta L Naveen Clardy      "

## 2024-12-30 NOTE — Progress Notes (Signed)
 PHARMACY - ANTICOAGULATION CONSULT NOTE  Pharmacy Consult for apixaban  Indication: atrial fibrillation  Allergies[1]  Patient Measurements: Height: 5' (152.4 cm) Weight: 49.9 kg (110 lb) IBW/kg (Calculated) : 45.5 HEPARIN DW (KG): 49.9  Vital Signs: Temp: 98.5 F (36.9 C) (01/15 0752) Temp Source: Oral (01/15 0752) BP: 112/66 (01/15 0752) Pulse Rate: 96 (01/15 0752)  Labs: Recent Labs    12/29/24 0830 12/29/24 0839 12/30/24 0531  HGB 11.9* 12.2 9.4*  HCT 37.3 36.0 28.2*  PLT 137*  --  112*  LABPROT 15.9*  --   --   INR 1.2  --   --   CREATININE 0.87 0.90 0.92    Estimated Creatinine Clearance: 24.5 mL/min (by C-G formula based on SCr of 0.92 mg/dL).  Assessment: 89 year old female with past medical history of atrial fibrillation on apixaban  PTA who presented to the hospital after sustaining a mechanical fall found to have a displaced L femur fracture. S/p cephalomedullary nailing w/ ortho 1/14. Pharmacy consulted to resume anticoagulation POD1 pending hgb. Hgb up 9.4, ok to resume apixaban  this morning.  Goal of Therapy:  Monitor platelets by anticoagulation protocol: Yes   Plan:  Start apixaban  2.5 mg PO BID Continue to monitor H&H and platelets    Thank you for allowing pharmacy to be a part of this patients care.  Shelba Collier, PharmD, BCPS Clinical Pharmacist     [1]  Allergies Allergen Reactions   Alendronate Other (See Comments)     Unknown   Calcitonin Nausea And Vomiting   Gabapentin Other (See Comments)    Blurred vision   Hydrocodone -Acetaminophen  Other (See Comments)    Dizziness    Latex Other (See Comments)     Unknown   Meperidine  Hcl Nausea And Vomiting   Oxycodone -Acetaminophen  Other (See Comments)    Unknown   Pravastatin Other (See Comments)    Unknown   Raloxifene Other (See Comments)     Unknown   Risedronate Other (See Comments)     Unknown   Tramadol  Hcl Other (See Comments)    Does not feel right   Darvocet  [Propoxyphene N-Acetaminophen ] Nausea And Vomiting   Demerol  Hives   Etodolac Hives and Rash    Unknown to patient   Hydrocodone  Itching   Meprozine [Meperidine -Promethazine] Other (See Comments)    Reaction=tongue swelling   Penicillins Rash   Percocet [Oxycodone -Acetaminophen ] Nausea And Vomiting    Dizziness    Ultracet [Tramadol -Acetaminophen ] Other (See Comments)    headache

## 2024-12-30 NOTE — Progress Notes (Addendum)
 Orthopaedic Trauma Progress Note  SUBJECTIVE: Patient doing okay today, just got back from XR. Pain in the left leg has been well-controlled.  Most of her pain is in the left shoulder. She has not been up out of bed yet since surgery.  Denies any numbness or tingling throughout the left leg or left arm. No chest pain. No SOB. Had an episode of vomiting last night, likely related to anesthesia. No nausea/vomiting today. No other complaints.  Tolerating fluids. Minimal appetite but has eaten a  small amount. Multiple family members at bedside. Family states they think the Norco is what caused her to vomit yesterday. Fentanyl  works well for her pain but causes hypotension. Note that she responds well to Tylenol . I will switch this to a scheduled basis.   OBJECTIVE:  Vitals:   12/30/24 0500 12/30/24 0752  BP: 119/67 112/66  Pulse: 71 96  Resp: 18 16  Temp: 98.6 F (37 C) 98.5 F (36.9 C)  SpO2: 96% 99%    Opiates Today (MME): Today's  total administered Morphine  Milligram Equivalents: 0 Opiates Yesterday (MME): Yesterday's total administered Morphine  Milligram Equivalents: 50  General: Resting in bed, no acute distress Respiratory: No increased work of breathing.  Operative Extremity (LLE): Dressings clean, dry, intact.  Some tenderness with palpation of the hip and throughout the lateral thigh as expected.  Less tender through the distal thigh, lower leg, ankle, foot.  Tolerates gentle ankle DF/PF.  No significant calf tenderness.  No pain with passive stretch of the toes.  Foot warm and well-perfused.  LUE: Sling in place. Notable bruising over the shoulder. Tender with palpation over this area. Non-tender through forearm. Wrist flexion/extension intact. Endorses sensation over all aspects of the hand. Fingers warm and well perfused  IMAGING: Stable post op imaging left hip  LABS:  Results for orders placed or performed during the hospital encounter of 12/29/24 (from the past 24 hours)   Surgical pcr screen     Status: None   Collection Time: 12/29/24  1:35 PM   Specimen: Nasal Mucosa; Nasal Swab  Result Value Ref Range   MRSA, PCR NEGATIVE NEGATIVE   Staphylococcus aureus NEGATIVE NEGATIVE  VITAMIN D  25 Hydroxy (Vit-D Deficiency, Fractures)     Status: None   Collection Time: 12/29/24  7:50 PM  Result Value Ref Range   Vit D, 25-Hydroxy 41.6 30 - 100 ng/mL  Basic metabolic panel     Status: Abnormal   Collection Time: 12/30/24  5:31 AM  Result Value Ref Range   Sodium 138 135 - 145 mmol/L   Potassium 4.6 3.5 - 5.1 mmol/L   Chloride 104 98 - 111 mmol/L   CO2 24 22 - 32 mmol/L   Glucose, Bld 131 (H) 70 - 99 mg/dL   BUN 25 (H) 8 - 23 mg/dL   Creatinine, Ser 9.07 0.44 - 1.00 mg/dL   Calcium  8.8 (L) 8.9 - 10.3 mg/dL   GFR, Estimated 56 (L) >60 mL/min   Anion gap 10 5 - 15  CBC     Status: Abnormal   Collection Time: 12/30/24  5:31 AM  Result Value Ref Range   WBC 8.5 4.0 - 10.5 K/uL   RBC 2.92 (L) 3.87 - 5.11 MIL/uL   Hemoglobin 9.4 (L) 12.0 - 15.0 g/dL   HCT 71.7 (L) 63.9 - 53.9 %   MCV 96.6 80.0 - 100.0 fL   MCH 32.2 26.0 - 34.0 pg   MCHC 33.3 30.0 - 36.0 g/dL   RDW  12.5 11.5 - 15.5 %   Platelets 112 (L) 150 - 400 K/uL   nRBC 0.0 0.0 - 0.2 %  Magnesium      Status: None   Collection Time: 12/30/24  5:31 AM  Result Value Ref Range   Magnesium  1.7 1.7 - 2.4 mg/dL    ASSESSMENT: Jessica Berg is a 89 y.o. female, 1 Day Post-Op s/p fall Procedures:  CEPHALOMEDULLARY NAILING LEFT INTERTROCHANTERIC FEMUR FRACTURE NONOPERATIVE MANAGEMENT LEFT PROXIMAL HUMERUS FRACTURE  CV/Blood loss: Acute blood loss anemia, Hgb 9.4 this AM. Hemodynamically stable  PLAN: Weightbearing: WBAT LLE.  NWB LUE ROM:  LLE - unrestricted ROM LUE - maintain sling.  Okay for elbow and wrist motion Incisional and dressing care: Reinforce dressings as needed  Showering: Okay to begin getting LLE incisions wet starting 01/01/2025 Orthopedic device(s): Sling LUE  Pain management:   1. Tylenol  650 mg q 6 hours  2. Celebrex  200 mg BID 3. Norco 5-325 mg q 4 hours PRN 4. Fentanyl  25 mcg mg q 2 hours PRN VTE prophylaxis: Okay to resume home dose Eliquis  today.  Continue SCDs ID:  Ancef  2gm post op Foley/Lines:  No foley, KVO IVFs Impediments to Fracture Healing: Vitamin D  level 41, no adjustment to supplementation needed Dispo: PT/OT evaluation today, dispo pending.  Patient may require SNF at discharge.  Ortho issues stable.  Okay for discharge from ortho standpoint once cleared by medicine team and therapies  D/C recommendations: - Tylenol  for pain control - Continue home dose Eliquis  for DVT prophylaxis - Continue home dose Vit D supplementation  Follow - up plan: 2 weeks after d/c for wound check and repeat x-rays   Contact information:  Franky Light MD, Lauraine Moores PA-C. After hours and holidays please check [email or web address] for group call information for [facility]   Lauraine PATRIC Moores, PA-C [phone] (office) [email or web address]

## 2024-12-31 DIAGNOSIS — S72002A Fracture of unspecified part of neck of left femur, initial encounter for closed fracture: Secondary | ICD-10-CM | POA: Diagnosis not present

## 2024-12-31 LAB — CBC
HCT: 24.7 % — ABNORMAL LOW (ref 36.0–46.0)
Hemoglobin: 8.1 g/dL — ABNORMAL LOW (ref 12.0–15.0)
MCH: 31.5 pg (ref 26.0–34.0)
MCHC: 32.8 g/dL (ref 30.0–36.0)
MCV: 96.1 fL (ref 80.0–100.0)
Platelets: 109 K/uL — ABNORMAL LOW (ref 150–400)
RBC: 2.57 MIL/uL — ABNORMAL LOW (ref 3.87–5.11)
RDW: 12.4 % (ref 11.5–15.5)
WBC: 6.9 K/uL (ref 4.0–10.5)
nRBC: 0 % (ref 0.0–0.2)

## 2024-12-31 LAB — BASIC METABOLIC PANEL WITH GFR
Anion gap: 9 (ref 5–15)
BUN: 38 mg/dL — ABNORMAL HIGH (ref 8–23)
CO2: 23 mmol/L (ref 22–32)
Calcium: 9 mg/dL (ref 8.9–10.3)
Chloride: 102 mmol/L (ref 98–111)
Creatinine, Ser: 1.34 mg/dL — ABNORMAL HIGH (ref 0.44–1.00)
GFR, Estimated: 36 mL/min — ABNORMAL LOW
Glucose, Bld: 87 mg/dL (ref 70–99)
Potassium: 4.7 mmol/L (ref 3.5–5.1)
Sodium: 134 mmol/L — ABNORMAL LOW (ref 135–145)

## 2024-12-31 MED ORDER — OXYCODONE HCL 5 MG PO TABS: 5.0000 mg | ORAL_TABLET | ORAL | 0 refills | Status: DC | PRN

## 2024-12-31 MED ORDER — SODIUM CHLORIDE 0.9 % IV SOLN: INTRAVENOUS | Status: DC

## 2024-12-31 MED ORDER — CHLORHEXIDINE GLUCONATE CLOTH 2 % EX PADS
6.0000 | MEDICATED_PAD | Freq: Every day | CUTANEOUS | Status: DC
  Administered 2024-12-31 – 2025-01-03 (×4): 6 via TOPICAL

## 2024-12-31 MED ORDER — ACETAMINOPHEN 325 MG PO TABS: 650.0000 mg | ORAL_TABLET | Freq: Four times a day (QID) | ORAL | Status: DC | PRN

## 2024-12-31 MED ORDER — CELECOXIB 200 MG PO CAPS
200.0000 mg | ORAL_CAPSULE | Freq: Two times a day (BID) | ORAL | Status: DC
  Administered 2025-01-01 – 2025-01-03 (×5): 200 mg via ORAL
  Filled 2024-12-31 (×5): qty 1

## 2024-12-31 MED ORDER — SODIUM CHLORIDE 0.9 % IV BOLUS
250.0000 mL | Freq: Once | INTRAVENOUS | Status: AC
  Administered 2024-12-31: 250 mL via INTRAVENOUS

## 2024-12-31 MED ORDER — OXYCODONE HCL 5 MG PO TABS
2.5000 mg | ORAL_TABLET | ORAL | Status: DC | PRN
  Administered 2024-12-31: 5 mg via ORAL
  Filled 2024-12-31: qty 1

## 2024-12-31 NOTE — Progress Notes (Signed)
 Physical Therapy Treatment Patient Details Name: Jessica Berg MRN: [id number] DOB: 1924-06-12 Today's Date: 12/31/2024   History of Present Illness 89 y.o. F adm 12/29/24 after ground-level fall sustaining left intertrochanteric femur fracture and left proximal humerus fracture. CT head/cervical spine scan was negative for acute findings. Pt s/p cephalomedullary nailing of LLE. PMHx: A-fib, HTN, syncope, CKD II, GERD, HLD, OA, and osteoporosis.    PT Comments  Pt received in supine, lethargic after premedication for pain, daughters x3 in room and supportive. Pt limited due to pain/guarding despite premedication but BP noted to be soft after rolling and repositioning when HOB ~30 deg. Per NT in room during hygiene assist, her bladder has ~310mL. Foley may need to be placed (per RN), so given low BP, pt fatigue/lethargy and pt retaining urine, defer OOB to chair at this time. Per her daughters, she hasn't been sleeping well at night in hospital. Pt performed rolling and partial transfer to long sitting with totalA +2 for all aspects of mobility. Pt slightly more alert when MD came to speak with her, but otherwise pt appears lethargic and needs encouragement to open her eyes. Discussion on pressure relief frequency/technique and benefits of AAROM as pt tolerates, within guidelines (no shoulder ROM but can move BLE as tolerated and elbow/wrist).   If plan is discharge home, recommend the following: Two people to help with walking and/or transfers;A lot of help with bathing/dressing/bathroom;Assistance with cooking/housework;Assist for transportation;Help with stairs or ramp for entrance;Assistance with feeding (pt coughing drinking water today despite HOB elevated and max cues for attention to task, RN notified)   Can travel by private vehicle     No  Equipment Recommendations  Wheelchair (measurements PT);Wheelchair cushion (measurements PT);BSC/3in1    Recommendations for Other Services        Precautions / Restrictions Precautions Precautions: Fall Recall of Precautions/Restrictions: Impaired Required Braces or Orthoses: Sling (RUE) Restrictions Weight Bearing Restrictions Per Provider Order: Yes LUE Weight Bearing Per Provider Order: Non weight bearing LLE Weight Bearing Per Provider Order: Weight bearing as tolerated Other Position/Activity Restrictions: LLE: unrestricted ROM; LUE: okay for elbow and wrist motion     Mobility  Bed Mobility Overal bed mobility: Needs Assistance Bed Mobility: Supine to Sit, Sit to Supine, Rolling Rolling: Max assist, +2 for safety/equipment, Used rails   Supine to sit: +2 for physical assistance, HOB elevated, Total assist, Used rails Sit to supine: Total assist, +2 for physical assistance, Used rails   General bed mobility comments: Pt lethargic during rolling to L/R sides for hygiene assist and bed pad change. With attempts at supine to long sitting while changing gown for patient, she is moaning/guarding and unable to significantly assist with RUE on rail despite max cues, so +2 totalA. +2 totalA for posterior supine scooting toward HOB. Pt bed positioned in chair posture to promote alertness with pillow behind R side and back/neck and under her RUE to prevent R lean/pushing into rail (pt head was touching R bed rail when PTA arrived to her room initially). Pt BP checked after repositioning/hygiene assist and noted to be soft with HOB ~35 deg, so defer EOB/OOB after that time due to NT bladder scan showing ~376mL, soft BP and pt lethargy.    Transfers                   General transfer comment: defer, pt too lethargic and BP too soft to safely attempt this date.    Ambulation/Gait  Stairs             Wheelchair Mobility     Tilt Bed    Modified Rankin (Stroke Patients Only)       Balance Overall balance assessment: Needs assistance Sitting-balance support: Single extremity supported,  Feet supported Sitting balance-Leahy Scale: Zero Sitting balance - Comments: attempt at long sitting, lethargy limiting pt attention to task. Postural control: Posterior lean, Right lateral lean     Standing balance comment: defer, not safe to assess this date                            Communication Communication Communication: No apparent difficulties  Cognition Arousal: Lethargic, Suspect due to medications Behavior During Therapy: Anxious   PT - Cognitive impairments: Orientation, Sequencing, Initiation, Problem solving, Safety/Judgement, Attention, Memory, Awareness   Orientation impairments: Time, Place, Situation                   PT - Cognition Comments: Pt drowsy after premedication with PO pain meds, mostly keeping eyes closed unless continuously spoken to in loud voice.  She appears to have delayed processing, pt reporting her DOB ~3 mins after initially asked by PTA. Anxious and guarding when attempting to mobilize, despite premedication. Family x3 in room present and supportive. Following commands: Impaired Following commands impaired: Follows one step commands with increased time, Follows multi-step commands inconsistently    Cueing Cueing Techniques: Verbal cues, Gestural cues, Tactile cues, Visual cues  Exercises Other Exercises Other Exercises: supine BLE AAROM: heel slides x5 reps on LLE, x10 reps on RLE (pt guarding/moaning, gradual increased ROM as tolerated, c/o pain on RLE but less severe than on LLE), hip abduction x5 reps AAROM limited range as tolerated Other Exercises: supine LUE AAROM: wrist/elbow flex/ext x5 reps with limited range as pain allows Supine RLE AROM: single leg bridges x3 reps    General Comments General comments (skin integrity, edema, etc.): Readjusted shoulder sling and posterior strap removed to protect her skin as pt too lethargic to attempt EOB/OOB and is resting comfortably and not restless. 3 daughters present and  supportive throughout session. Ice applied to L shoulder and L hip, and family members agreeable to remove ice after 15-20 mins or if pt c/o feeling too cold. Pt heels floated to relieve pressure. pt daughters report she does not typically tolerate tight/compression socks on her legs.      Pertinent Vitals/Pain Pain Assessment Pain Assessment: PAINAD Breathing: occasional labored breathing, short period of hyperventilation Negative Vocalization: repeated troubled calling out, loud moaning/groaning, crying Facial Expression: facial grimacing Body Language: tense, distressed pacing, fidgeting Consolability: distracted or reassured by voice/touch PAINAD Score: 7 Pain Location: LUE and LLE with repositioning in bed, rolling and A/AAROM Pain Descriptors / Indicators: Aching, Discomfort, Grimacing, Moaning, Guarding Pain Intervention(s): Limited activity within patient's tolerance, Monitored during session, Premedicated before session, Repositioned, Ice applied    Home Living                          Prior Function            PT Goals (current goals can now be found in the care plan section) Acute Rehab PT Goals Patient Stated Goal: Per family, for her to get stronger so she can go home. PT Goal Formulation: With patient/family Time For Goal Achievement: 01/13/25 Progress towards PT goals: Progressing toward goals    Frequency  Min 2X/week      PT Plan      Co-evaluation              AM-PAC PT 6 Clicks Mobility   Outcome Measure  Help needed turning from your back to your side while in a flat bed without using bedrails?: Total Help needed moving from lying on your back to sitting on the side of a flat bed without using bedrails?: Total Help needed moving to and from a bed to a chair (including a wheelchair)?: Total Help needed standing up from a chair using your arms (e.g., wheelchair or bedside chair)?: Total Help needed to walk in hospital room?:  Total Help needed climbing 3-5 steps with a railing? : Total 6 Click Score: 6    End of Session Equipment Utilized During Treatment: Other (comment) (bed pad assist, LUE with sling donned throughout) Activity Tolerance: Patient limited by pain;Patient limited by lethargy;Treatment limited secondary to medical complications (Comment);Other (comment) (BP soft wtih HOB ~35 deg; bladder scan by NT during repositioning in bed showing ~362mL, RN notified) Patient left: in bed;with call bell/phone within reach;with bed alarm set;with family/visitor present;Other (comment) (heels floated, pillows to prevent R lean, HOB ~40 deg in bed chair posture) Nurse Communication: Mobility status;Need for lift equipment;Patient requests pain meds;Precautions;Other (comment) (rec roll q2h, maxi-sky OOB on weekend if pt more alert) PT Visit Diagnosis: Difficulty in walking, not elsewhere classified (R26.2);Other abnormalities of gait and mobility (R26.89);Unsteadiness on feet (R26.81);History of falling (Z91.81);Pain Pain - Right/Left: Left Pain - part of body: Shoulder;Hip     Time: 8866-8785 PT Time Calculation (min) (ACUTE ONLY): 41 min  Charges:    $Therapeutic Exercise: 8-22 mins $Therapeutic Activity: 23-37 mins PT General Charges $$ ACUTE PT VISIT: 1 Visit                     Ernie Sagrero P., PTA Acute Rehabilitation Services Secure Chat Preferred 9a-5:30pm Office: [phone]    [facility] 12/31/2024, 12:42 PM

## 2024-12-31 NOTE — Progress Notes (Signed)
 [address]  EVONA WESTRA  FMW:[id number] DOB: 04-15-1924 DOA: 12/29/2024 PCP: Charlott Dorn LABOR, MD    Brief Narrative:  89 year old with a history of atrial fibrillation on Eliquis , CKD stage II, GERD, HLD, OA, and osteoporosis who presented to the ER 1/14 after suffering a mechanical fall without loss of consciousness.  She had immediate left hip and shoulder pain.  In the ER x-rays of the left shoulder revealed an impacted and displaced fracture of the left proximal humerus.  Pelvis x-rays revealed an acute mildly angulated moderately displaced left intertrochanteric femur fracture.  CT head and cervical spine was without acute findings.  CT abdomen and pelvis was without acute findings.  Goals of Care:   Code Status: Limited: Do not attempt resuscitation (DNR) -DNR-LIMITED -Do Not Intubate/DNI    DVT prophylaxis: apixaban  (ELIQUIS ) tablet 2.5 mg Start: 12/30/24 1000 SCDs Start: 12/29/24 1650 apixaban  (ELIQUIS ) tablet 2.5 mg   Interim Hx: Afebrile.  Vital signs stable.  Resting company at the time of my visit.  No new complaints.  Sleepy but in good spirits.  Assessment & Plan:  Displaced left intertrochanteric femur fracture status post mechanical fall Status post cephalomedullary nailing 1/14 afternoon - postoperative care per Orthopedics as follows: Weightbearing: WBAT LLE.  NWB LUE ROM:  LLE - unrestricted ROM   LUE - maintain sling.  Okay for elbow and wrist motion Incisional and dressing care: Dressing removed. Ok to leave open to air Showering: Okay to begin getting LLE incisions wet starting 01/01/2025 Orthopedic device(s): Sling LUE  VTE prophylaxis: home dose Eliquis .  Continue SCDs Dispo: PT/OT evaluation today, recommending SNF at discharge.  Ortho issues stable.  Okay for discharge from ortho standpoint once cleared by medicine team and therapies  Left proximal humerus fracture status post mechanical fall Care as per Orthopedics with nonoperative management - to utilize  sling with nonweightbearing precautions as noted above  Chronic atrial fibrillation on anticoagulation Eliquis  per home dose - heart rate controlled  CKD stage II baseline crt ~ 0.8 - slight increase w/ surgery - follow up in AM - assure is well hydrated   GERD Continue usual Protonix  Family Communication: spoke with daughters at bedside  Disposition:  Skilled Nursing-Short Term Rehab (<3 Hours/Day)12/30/2024 1500  Objective: Blood pressure (!) 100/51, pulse 64, temperature 97.9 F (36.6 C), temperature source Oral, resp. rate 16, height 5' (1.524 m), weight 49.9 kg, SpO2 98%.  Intake/Output Summary (Last 24 hours) at 12/31/2024 0956 Last data filed at 12/31/2024 0013 Gross per 24 hour  Intake 240 ml  Output 600 ml  Net -360 ml   Filed Weights   12/29/24 0709 12/29/24 1349  Weight: 49.9 kg 49.9 kg    Examination: General: No acute respiratory distress Lungs: Clear to auscultation bilaterally  Cardiovascular: Regular rate w/o M  Abdomen: NT/ND, soft, bs+ Extremities: No significant edema bilateral lower extremities  CBC: Recent Labs  Lab 12/29/24 0830 12/29/24 0839 12/30/24 0531 12/31/24 0456  WBC 9.4  --  8.5 6.9  NEUTROABS 8.3*  --   --   --   HGB 11.9* 12.2 9.4* 8.1*  HCT 37.3 36.0 28.2* 24.7*  MCV 98.4  --  96.6 96.1  PLT 137*  --  112* 109*   Basic Metabolic Panel: Recent Labs  Lab 12/29/24 0830 12/29/24 0839 12/30/24 0531 12/31/24 0456  NA 142 142 138 134*  K 4.2 4.1 4.6 4.7  CL 105 103 104 102  CO2 27  --  24 23  GLUCOSE 123*  120* 131* 87  BUN 30* 32* 25* 38*  CREATININE 0.87 0.90 0.92 1.34*  CALCIUM  9.3  --  8.8* 9.0  MG  --   --  1.7  --    GFR: Estimated Creatinine Clearance: 16.8 mL/min (A) (by C-G formula based on SCr of 1.34 mg/dL (H)).   Scheduled Meds:  acetaminophen   650 mg Oral Q6H   apixaban   2.5 mg Oral BID   ascorbic acid   500 mg Oral Daily   bacitracin   1 Application Topical Daily   calcium -vitamin D   1 tablet Oral Q1200    celecoxib   200 mg Oral BID   cholecalciferol   1,000 Units Oral Daily   metoprolol  succinate  37.5 mg Oral QHS   senna  1 tablet Oral Daily      LOS: 2 days   Reyes IVAR Moores, MD [facility] Office  [phone] Pager - Text Page per Tracey  If 7PM-7AM, please contact night-coverage per Amion 12/31/2024, 9:56 AM     "

## 2024-12-31 NOTE — NC FL2 (Signed)
 " [place]  MEDICAID FL2 LEVEL OF CARE FORM     IDENTIFICATION  Patient Name: Jessica Berg Birthdate: [birth year] Sex: female Admission Date (Current Location): 12/29/2024  [facility] and [place] Number:  Producer, Television/film/video and Address:  [facility], [address] [place], [place] [address]      Provider Number: [id number]  Attending Physician Name and Address:  Danton Reyes DASEN, MD  Relative Name and Phone Number:  Rolene Race Daughter [phone]    Current Level of Care: Hospital Recommended Level of Care: Skilled Nursing Facility Prior Approval Number:    Date Approved/Denied:   PASRR Number: [id number]  Discharge Plan: SNF    Current Diagnoses: Patient Active Problem List   Diagnosis Date Noted   Closed left hip fracture (HCC) 12/29/2024   Fall at home, initial encounter 09/06/2022   Near syncope 09/06/2022   Generalized weakness 09/06/2022   Stage 3b chronic kidney disease (CKD) (HCC) 09/06/2022   Glaucoma 09/06/2022   Leg cramps 09/04/2022   Debility 09/04/2022   Syncope 09/03/2022   Essential hypertension 09/18/2021   Encounter for long-term (current) use of other medications 09/09/2014   Tremor 09/09/2014   Hypercoagulable state due to atrial fibrillation (HCC) 03/09/2014   Tremulousness 03/09/2014   Hypercholesteremia    Permanent atrial fibrillation (HCC)    MR (mitral regurgitation)     Orientation RESPIRATION BLADDER Height & Weight     Self  Normal Continent Weight: 110 lb (49.9 kg) Height:  5' (152.4 cm)  BEHAVIORAL SYMPTOMS/MOOD NEUROLOGICAL BOWEL NUTRITION STATUS      Continent    AMBULATORY STATUS COMMUNICATION OF NEEDS Skin     Verbally Surgical wounds, Other (Comment) (ecchymosis)                       Personal Care Assistance Level of Assistance  Bathing, Feeding, Dressing Bathing Assistance: Maximum assistance Feeding assistance: Limited assistance Dressing Assistance: Maximum  assistance     Functional Limitations Info  Sight, Hearing, Speech Sight Info: Adequate Hearing Info: Adequate Speech Info: Adequate    SPECIAL CARE FACTORS FREQUENCY  OT (By licensed OT), PT (By licensed PT)     PT Frequency: 5x week OT Frequency: 5x week            Contractures Contractures Info: Not present    Additional Factors Info  Code Status, Allergies Code Status Info: DNR Allergies Info: Alendronate, Calcitonin, Gabapentin, Hydrocodone -acetaminophen , Latex, Meperidine  Hcl, Oxycodone -acetaminophen , Pravastatin, Raloxifene, Risedronate, Tramadol  Hcl, Darvocet (Propoxyphene N-acetaminophen ), Demerol , Etodolac, Hydrocodone , Meprozine (Meperidine -promethazine), Penicillins, Percocet (Oxycodone -acetaminophen ), Ultracet (Tramadol -acetaminophen )           Current Medications (12/31/2024):  This is the current hospital active medication list Current Facility-Administered Medications  Medication Dose Route Frequency Provider Last Rate Last Admin   acetaminophen  (TYLENOL ) tablet 650 mg  650 mg Oral Q6H Danton Domino A, PA-C   650 mg at 12/31/24 1210   apixaban  (ELIQUIS ) tablet 2.5 mg  2.5 mg Oral BID Danton Reyes T, MD   2.5 mg at 12/31/24 0848   ascorbic acid  (VITAMIN C ) tablet 500 mg  500 mg Oral Daily Danton Domino LABOR, PA-C   500 mg at 12/31/24 0848   bacitracin  ointment 1 Application  1 Application Topical Daily Danton Reyes DASEN, MD   1 Application at 12/31/24 9149   calcium -vitamin D  (OSCAL WITH D) 500-5 MG-MCG per tablet 1 tablet  1 tablet Oral Q1200 Danton Reyes DASEN, MD   1 tablet at  12/31/24 1209   [START ON 01/01/2025] celecoxib  (CELEBREX ) capsule 200 mg  200 mg Oral BID Danton Lauraine LABOR, PA-C       Chlorhexidine  Gluconate Cloth 2 % PADS 6 each  6 each Topical Daily Danton Reyes DASEN, MD       cholecalciferol  (VITAMIN D3) 25 MCG (1000 UNIT) tablet 1,000 Units  1,000 Units Oral Daily Danton Reyes DASEN, MD   1,000 Units at 12/31/24 0848   metoprolol  succinate  (TOPROL -XL) 24 hr tablet 37.5 mg  37.5 mg Oral QHS Danton Reyes DASEN, MD   37.5 mg at 12/30/24 2033   morphine  (PF) 2 MG/ML injection 2 mg  2 mg Intravenous Q2H PRN Danton Reyes DASEN, MD       ondansetron  (ZOFRAN ) tablet 4 mg  4 mg Oral Q6H PRN Danton, Sarah A, PA-C       Or   ondansetron  (ZOFRAN ) injection 4 mg  4 mg Intravenous Q6H PRN Danton Lauraine LABOR, PA-C   4 mg at 12/30/24 9165   oxyCODONE  (Oxy IR/ROXICODONE ) immediate release tablet 2.5-5 mg  2.5-5 mg Oral Q4H PRN Danton Lauraine LABOR, PA-C   5 mg at 12/31/24 1053   polyethylene glycol (MIRALAX  / GLYCOLAX ) packet 17 g  17 g Oral Daily PRN Danton Lauraine LABOR, PA-C       senna (SENOKOT) tablet 8.6 mg  1 tablet Oral Daily Danton Reyes DASEN, MD   8.6 mg at 12/31/24 0848     Discharge Medications: Please see discharge summary for a list of discharge medications.  Relevant Imaging Results:  Relevant Lab Results:   Additional Information SSN: [id number]  Bridget Cordella Simmonds, LCSW     "

## 2024-12-31 NOTE — Progress Notes (Cosign Needed Addendum)
 Orthopaedic Trauma Progress Note  SUBJECTIVE: Patient doing okay this AM. Pain currently controlled with Tylenol  and Celebrex . Has not required any opiates over last 24 hours. Denies any numbness or tingling throughout the left leg or left arm. No chest pain. No SOB. Denies nausea/vomiting. No other complaints.  No family at bedside currently.  OBJECTIVE:  Vitals:   12/31/24 0331 12/31/24 0741  BP: 107/62 (!) 100/51  Pulse: (!) 57 64  Resp: 18 16  Temp: 97.9 F (36.6 C) 97.9 F (36.6 C)  SpO2: 95% 98%    Opiates Today (MME): Today's  total administered Morphine  Milligram Equivalents: 0 Opiates Yesterday (MME): Yesterday's total administered Morphine  Milligram Equivalents: 7.5  General: Resting in bed, no acute distress Respiratory: No increased work of breathing.  Operative Extremity (LLE): Dressings removed incisions are clean, dry, intact.  Some tenderness with palpation of the hip and throughout the lateral thigh as expected.  Less tender through the distal thigh, lower leg, ankle, foot.  Tolerates gentle ankle DF/PF.  No significant calf tenderness.  No pain with passive stretch of the toes.  Foot warm and well-perfused.  LUE: Sling in place. Notable bruising over the shoulder. Tender with palpation over this area. Non-tender through forearm. Wrist flexion/extension intact. Endorses sensation over all aspects of the hand. Fingers warm and well perfused  IMAGING: Stable post op imaging left hip  LABS:  Results for orders placed or performed during the hospital encounter of 12/29/24 (from the past 24 hours)  Basic metabolic panel with GFR     Status: Abnormal   Collection Time: 12/31/24  4:56 AM  Result Value Ref Range   Sodium 134 (L) 135 - 145 mmol/L   Potassium 4.7 3.5 - 5.1 mmol/L   Chloride 102 98 - 111 mmol/L   CO2 23 22 - 32 mmol/L   Glucose, Bld 87 70 - 99 mg/dL   BUN 38 (H) 8 - 23 mg/dL   Creatinine, Ser 8.65 (H) 0.44 - 1.00 mg/dL   Calcium  9.0 8.9 - 10.3 mg/dL    GFR, Estimated 36 (L) >60 mL/min   Anion gap 9 5 - 15  CBC     Status: Abnormal   Collection Time: 12/31/24  4:56 AM  Result Value Ref Range   WBC 6.9 4.0 - 10.5 K/uL   RBC 2.57 (L) 3.87 - 5.11 MIL/uL   Hemoglobin 8.1 (L) 12.0 - 15.0 g/dL   HCT 75.2 (L) 63.9 - 53.9 %   MCV 96.1 80.0 - 100.0 fL   MCH 31.5 26.0 - 34.0 pg   MCHC 32.8 30.0 - 36.0 g/dL   RDW 87.5 88.4 - 84.4 %   Platelets 109 (L) 150 - 400 K/uL   nRBC 0.0 0.0 - 0.2 %    ASSESSMENT: Jessica Berg is a 89 y.o. female, 2 Days Post-Op s/p fall Procedures:  CEPHALOMEDULLARY NAILING LEFT INTERTROCHANTERIC FEMUR FRACTURE NONOPERATIVE MANAGEMENT LEFT PROXIMAL HUMERUS FRACTURE  CV/Blood loss: Acute blood loss anemia, Hgb 8.1 this AM. Hemodynamically stable  PLAN: Weightbearing: WBAT LLE.  NWB LUE ROM:  LLE - unrestricted ROM LUE - maintain sling.  Okay for elbow and wrist motion Incisional and dressing care: Dressing removed. Ok to leave open to air Showering: Okay to begin getting LLE incisions wet starting 01/01/2025 Orthopedic device(s): Sling LUE  Pain management:  1. Tylenol  650 mg q 6 hours  2. Celebrex  200 mg BID 3. Oxycodone  2.5-5 mg q 4 hours PRN 4. Morphine  2 mg q 2 hours PRN VTE  prophylaxis: home dose Eliquis .  Continue SCDs ID:  Ancef  2gm post op completed Foley/Lines:  No foley, KVO IVFs Impediments to Fracture Healing: Vitamin D  level 41, no adjustment to supplementation needed Dispo: PT/OT evaluation today, recommending SNF at discharge.  Ortho issues stable.  Okay for discharge from ortho standpoint once cleared by medicine team and therapies  D/C recommendations: - Tylenol , low dose oxycodone  for pain control - Continue home dose Eliquis  for DVT prophylaxis - Continue home dose Vit D supplementation  Follow - up plan: 2 weeks after d/c for wound check and repeat x-rays   Contact information:  Franky Light MD, Lauraine Moores PA-C. After hours and holidays please check [email or web address] for group call  information for [facility]   Lauraine PATRIC Moores, PA-C [phone] (office) [email or web address]

## 2024-12-31 NOTE — TOC Initial Note (Signed)
 Transition of Care [facility]) - Initial/Assessment Note    Patient Details  Name: Jessica Berg MRN: [id number] Date of Birth: 1924-09-19  Transition of Care [facility]) CM/SW Contact:    Rosalva Jon Bloch, RN Phone Number: 12/31/2024, 11:19 AM  Clinical Narrative:   Presents after a fall. Suffered a displaced left intertrochanteric femur fracture  -Status post cephalomedullary nailing 1/14  RNCM @ bedside to discuss d/c planning.    Pt oriented to self, only. Daughters Rosaline and Ronal Hun are @ bedside.             RNCM received consult for possible SNF placement at time of discharge. RNCM spoke with daughters  regarding PT recommendation of SNF placement at time of discharge. Daughters reported that patient lives alone with a supportive family. States pt is currently unable to care for self  independently  given patient's current physical needs and fall risk. Daughters expressed understanding of PT recommendation and is agreeable to SNF placement at time of discharge. Daughters reports preference for [facility]  RNCM discussed insurance authorization process and provided Medicare SNF ratings list. Daughters expressed being hopeful for rehab and to feel better soon. No further questions reported at this time. RNCM to continue to follow and assist with discharge planning needs.   Expected Discharge Plan: Skilled Nursing Facility Barriers to Discharge: Continued Medical Work up   Patient Goals and CMS Choice     Choice offered to / list presented to : Adult Children      Expected Discharge Plan and Services   Discharge Planning Services: CM Consult   Living arrangements for the past 2 months: Single Family Home                                      Prior Living Arrangements/Services Living arrangements for the past 2 months: Single Family Home Lives with:: Self Patient language and need for interpreter reviewed:: Yes Do you feel safe going back to the place where you  live?: Yes      Need for Family Participation in Patient Care: Yes (Comment) Care giver support system in place?: Yes (comment)   Criminal Activity/Legal Involvement Pertinent to Current Situation/Hospitalization: No - Comment as needed  Activities of Daily Living      Permission Sought/Granted   Permission granted to share information with : Yes, Verbal Permission Granted  Share Information with NAME: Rosaline Daring  Daughter  [phone]  Karletta Millay  Daughter  [phone]           Emotional Assessment Appearance:: Appears stated age     Orientation: : Oriented to Self Alcohol / Substance Use: Not Applicable Psych Involvement: No (comment)  Admission diagnosis:  Closed left hip fracture (HCC) [S72.002A] Patient Active Problem List   Diagnosis Date Noted   Closed left hip fracture (HCC) 12/29/2024   Fall at home, initial encounter 09/06/2022   Near syncope 09/06/2022   Generalized weakness 09/06/2022   Stage 3b chronic kidney disease (CKD) (HCC) 09/06/2022   Glaucoma 09/06/2022   Leg cramps 09/04/2022   Debility 09/04/2022   Syncope 09/03/2022   Essential hypertension 09/18/2021   Encounter for long-term (current) use of other medications 09/09/2014   Tremor 09/09/2014   Hypercoagulable state due to atrial fibrillation (HCC) 03/09/2014   Tremulousness 03/09/2014   Hypercholesteremia    Permanent atrial fibrillation (HCC)    MR (mitral regurgitation)    PCP:  Charlott Dorn LABOR, MD Pharmacy:   [id number] [place], [place] - [address] [place] [address] Phone: [phone] Fax: [phone]  [facility] - [place], [place] - [address] [place] [address] Phone: [phone] Fax: [phone]     Social Drivers of Health (SDOH) Social History: SDOH Screenings   Food Insecurity: No Food Insecurity (09/04/2022)  Housing: Low Risk (09/04/2022)  Transportation Needs: No  Transportation Needs (09/04/2022)  Utilities: Not At Risk (09/04/2022)  Tobacco Use: Low Risk (12/29/2024)   SDOH Interventions:     Readmission Risk Interventions     No data to display

## 2024-12-31 NOTE — TOC Progression Note (Addendum)
 Transition of Care [facility]) - Progression Note    Patient Details  Name: Jessica Berg MRN: [id number] Date of Birth: Dec 10, 1924  Transition [facility]) CM/SW Contact  Bridget Cordella Simmonds, LCSW Phone Number: 12/31/2024, 3:13 PM  Clinical Narrative:   Initial bed offers provided to pt daughters on medicare choice document.    CSW awaiting response from [facility].  1600: [facility] does offer.  CSW spoke with daughter Jessica Berg.  She asked about [facility] and [facility]--neither take pt insurance.  She does want to accept offer at [facility].  CSW confirmed with [facility]: they can receive pt tomorrow, RN April would be the contact.  SNF auth request submitted in [place] and approved: [id number], 4 days: 1/17-1/20.  Expected Discharge Plan: Skilled Nursing Facility Barriers to Discharge: Continued Medical Work up               Expected Discharge Plan and Services   Discharge Planning Services: CM Consult   Living arrangements for the past 2 months: Single Family Home                                       Social Drivers of Health (SDOH) Interventions SDOH Screenings   Food Insecurity: No Food Insecurity (12/31/2024)  Housing: Low Risk (12/31/2024)  Transportation Needs: No Transportation Needs (12/31/2024)  Utilities: Not At Risk (12/31/2024)  Tobacco Use: Low Risk (12/29/2024)    Readmission Risk Interventions     No data to display

## 2025-01-01 DIAGNOSIS — S72002A Fracture of unspecified part of neck of left femur, initial encounter for closed fracture: Secondary | ICD-10-CM | POA: Diagnosis not present

## 2025-01-01 LAB — CBC
HCT: 24.8 % — ABNORMAL LOW (ref 36.0–46.0)
Hemoglobin: 7.9 g/dL — ABNORMAL LOW (ref 12.0–15.0)
MCH: 31.6 pg (ref 26.0–34.0)
MCHC: 31.9 g/dL (ref 30.0–36.0)
MCV: 99.2 fL (ref 80.0–100.0)
Platelets: 98 K/uL — ABNORMAL LOW (ref 150–400)
RBC: 2.5 MIL/uL — ABNORMAL LOW (ref 3.87–5.11)
RDW: 12.5 % (ref 11.5–15.5)
WBC: 5.8 K/uL (ref 4.0–10.5)
nRBC: 0 % (ref 0.0–0.2)

## 2025-01-01 LAB — BASIC METABOLIC PANEL WITH GFR
Anion gap: 10 (ref 5–15)
BUN: 36 mg/dL — ABNORMAL HIGH (ref 8–23)
CO2: 20 mmol/L — ABNORMAL LOW (ref 22–32)
Calcium: 8.7 mg/dL — ABNORMAL LOW (ref 8.9–10.3)
Chloride: 105 mmol/L (ref 98–111)
Creatinine, Ser: 1.22 mg/dL — ABNORMAL HIGH (ref 0.44–1.00)
GFR, Estimated: 40 mL/min — ABNORMAL LOW
Glucose, Bld: 76 mg/dL (ref 70–99)
Potassium: 4.5 mmol/L (ref 3.5–5.1)
Sodium: 136 mmol/L (ref 135–145)

## 2025-01-01 MED ORDER — SODIUM CHLORIDE 0.9 % IV SOLN: INTRAVENOUS | Status: DC

## 2025-01-01 MED ORDER — LACTATED RINGERS IV BOLUS
250.0000 mL | Freq: Once | INTRAVENOUS | Status: AC
  Administered 2025-01-01: 250 mL via INTRAVENOUS

## 2025-01-01 MED ORDER — MAGNESIUM SULFATE 2 GM/50ML IV SOLN
2.0000 g | Freq: Once | INTRAVENOUS | Status: AC
  Administered 2025-01-01: 2 g via INTRAVENOUS
  Filled 2025-01-01: qty 50

## 2025-01-01 MED ORDER — OXYCODONE HCL 5 MG PO TABS: 2.5000 mg | ORAL_TABLET | ORAL | Status: DC | PRN

## 2025-01-01 MED ORDER — TRAMADOL HCL 50 MG PO TABS: 50.0000 mg | ORAL_TABLET | Freq: Four times a day (QID) | ORAL | Status: DC | PRN

## 2025-01-01 NOTE — Plan of Care (Signed)
 Patient is calm with relative at bedside   Problem: Health Behavior/Discharge Planning: Goal: Ability to manage health-related needs will improve Outcome: Progressing   Problem: Clinical Measurements: Goal: Will remain free from infection Outcome: Progressing   Problem: Nutrition: Goal: Adequate nutrition will be maintained Outcome: Progressing   Problem: Activity: Goal: Risk for activity intolerance will decrease Outcome: Progressing   Problem: Coping: Goal: Level of anxiety will decrease Outcome: Progressing   Problem: Elimination: Goal: Will not experience complications related to urinary retention Outcome: Progressing   Problem: Safety: Goal: Ability to remain free from injury will improve Outcome: Progressing   Problem: Respiratory: Goal: Will regain and/or maintain adequate ventilation Outcome: Progressing   Problem: Skin Integrity: Goal: Demonstrates signs of wound healing without infection Outcome: Progressing   Problem: Urinary Elimination: Goal: Will remain free from infection Outcome: Progressing

## 2025-01-01 NOTE — Progress Notes (Signed)
 [address]  Jessica Berg  FMW:[id number] DOB: [birth year] DOA: 12/29/2024 PCP: Charlott Dorn LABOR, MD    Brief Narrative:  89 year old with a history of atrial fibrillation on Eliquis , CKD stage II, GERD, HLD, OA, and osteoporosis who presented to the ER 1/14 after suffering a mechanical fall without loss of consciousness.  She had immediate left hip and shoulder pain.  In the ER x-rays of the left shoulder revealed an impacted and displaced fracture of the left proximal humerus.  Pelvis x-rays revealed an acute mildly angulated moderately displaced left intertrochanteric femur fracture.  CT head and cervical spine was without acute findings.  CT abdomen and pelvis was without acute findings.  Goals of Care:   Code Status: Limited: Do not attempt resuscitation (DNR) -DNR-LIMITED -Do Not Intubate/DNI    DVT prophylaxis: apixaban  (ELIQUIS ) tablet 2.5 mg Start: 12/30/24 1000 SCDs Start: 12/29/24 1650 apixaban  (ELIQUIS ) tablet 2.5 mg   Interim Hx: No acute events reported overnight.  Vital signs stable.  Afebrile.  The patient is emotional today, which family believes is related to morphine  she was given.  Otherwise she appears to be doing well.  She has no new complaints.  She is not yet moving her bowels.  Family has been able to get her to eat some.  Assessment & Plan:  Displaced left intertrochanteric femur fracture status post mechanical fall Status post cephalomedullary nailing 1/14 afternoon - postoperative care per Orthopedics as follows: Weightbearing: WBAT LLE.  NWB LUE ROM:  LLE - unrestricted ROM   LUE - maintain sling.  Okay for elbow and wrist motion Incisional and dressing care: Dressing removed. Ok to leave open to air Showering: Okay to begin getting LLE incisions wet starting 01/01/2025 Orthopedic device(s): Sling LUE  VTE prophylaxis: home dose Eliquis .  Continue SCDs Dispo: PT/OT recommending SNF at discharge.  Ortho issues stable.  Okay for discharge from ortho standpoint once  cleared by medicine team and therapies  Left proximal humerus fracture status post mechanical fall Care as per Orthopedics with nonoperative management - to utilize sling with nonweightbearing precautions as noted above  Chronic atrial fibrillation on anticoagulation Eliquis  per home dose - heart rate controlled  Constipation Increase bowel regimen  CKD stage II baseline crt ~ 0.8 - slight increase w/ surgery -improving and follow-up - assure is well hydrated budd encouraged consistent oral intake -recheck renal function in 3-4 days  GERD Continue usual Protonix  Family Communication: spoke with daughter at bedside  Disposition:  Skilled Nursing-Short Term Rehab (<3 Hours/Day)12/31/2024 1200 -anticipate discharge 1/19  Objective: Blood pressure 107/64, pulse 68, temperature 98.3 F (36.8 C), resp. rate 18, height 5' (1.524 m), weight 49.9 kg, SpO2 97%.  Intake/Output Summary (Last 24 hours) at 01/01/2025 0931 Last data filed at 01/01/2025 0150 Gross per 24 hour  Intake 279.86 ml  Output 3640 ml  Net -3360.14 ml   Filed Weights   12/29/24 0709 12/29/24 1349  Weight: 49.9 kg 49.9 kg    Examination: General: No acute respiratory distress Lungs: Clear to auscultation bilaterally  Cardiovascular: Regular rate w/o M  Abdomen: NT/ND, soft, bs+ Extremities: No significant edema B LE   CBC: Recent Labs  Lab 12/29/24 0830 12/29/24 0839 12/30/24 0531 12/31/24 0456 01/01/25 0433  WBC 9.4  --  8.5 6.9 5.8  NEUTROABS 8.3*  --   --   --   --   HGB 11.9*   < > 9.4* 8.1* 7.9*  HCT 37.3   < > 28.2* 24.7* 24.8*  MCV  98.4  --  96.6 96.1 99.2  PLT 137*  --  112* 109* 98*   < > = values in this interval not displayed.   Basic Metabolic Panel: Recent Labs  Lab 12/30/24 0531 12/31/24 0456 01/01/25 0433  NA 138 134* 136  K 4.6 4.7 4.5  CL 104 102 105  CO2 24 23 20*  GLUCOSE 131* 87 76  BUN 25* 38* 36*  CREATININE 0.92 1.34* 1.22*  CALCIUM  8.8* 9.0 8.7*  MG 1.7  --    --    GFR: Estimated Creatinine Clearance: 18.5 mL/min (A) (by C-G formula based on SCr of 1.22 mg/dL (H)).   Scheduled Meds:  acetaminophen   650 mg Oral Q6H   apixaban   2.5 mg Oral BID   ascorbic acid   500 mg Oral Daily   bacitracin   1 Application Topical Daily   calcium -vitamin D   1 tablet Oral Q1200   celecoxib   200 mg Oral BID   Chlorhexidine  Gluconate Cloth  6 each Topical Daily   cholecalciferol   1,000 Units Oral Daily   metoprolol  succinate  37.5 mg Oral QHS   senna  1 tablet Oral Daily      LOS: 3 days   Reyes IVAR Moores, MD [facility] Office  [phone] Pager - Text Page per Tracey  If 7PM-7AM, please contact night-coverage per Amion 01/01/2025, 9:31 AM     "

## 2025-01-02 DIAGNOSIS — S72002A Fracture of unspecified part of neck of left femur, initial encounter for closed fracture: Secondary | ICD-10-CM | POA: Diagnosis not present

## 2025-01-02 LAB — BASIC METABOLIC PANEL WITH GFR
Anion gap: 11 (ref 5–15)
BUN: 33 mg/dL — ABNORMAL HIGH (ref 8–23)
CO2: 22 mmol/L (ref 22–32)
Calcium: 8.4 mg/dL — ABNORMAL LOW (ref 8.9–10.3)
Chloride: 104 mmol/L (ref 98–111)
Creatinine, Ser: 0.94 mg/dL (ref 0.44–1.00)
GFR, Estimated: 54 mL/min — ABNORMAL LOW
Glucose, Bld: 78 mg/dL (ref 70–99)
Potassium: 4.2 mmol/L (ref 3.5–5.1)
Sodium: 137 mmol/L (ref 135–145)

## 2025-01-02 LAB — CBC
HCT: 23.5 % — ABNORMAL LOW (ref 36.0–46.0)
Hemoglobin: 7.7 g/dL — ABNORMAL LOW (ref 12.0–15.0)
MCH: 31.6 pg (ref 26.0–34.0)
MCHC: 32.8 g/dL (ref 30.0–36.0)
MCV: 96.3 fL (ref 80.0–100.0)
Platelets: 123 K/uL — ABNORMAL LOW (ref 150–400)
RBC: 2.44 MIL/uL — ABNORMAL LOW (ref 3.87–5.11)
RDW: 12.3 % (ref 11.5–15.5)
WBC: 5 K/uL (ref 4.0–10.5)
nRBC: 0 % (ref 0.0–0.2)

## 2025-01-02 LAB — MAGNESIUM: Magnesium: 2.1 mg/dL (ref 1.7–2.4)

## 2025-01-02 MED ORDER — POLYETHYLENE GLYCOL 3350 17 G PO PACK
17.0000 g | PACK | Freq: Two times a day (BID) | ORAL | Status: DC
  Administered 2025-01-02 – 2025-01-03 (×3): 17 g via ORAL
  Filled 2025-01-02 (×3): qty 1

## 2025-01-02 NOTE — Progress Notes (Signed)
 "  Jessica Berg  FMW:[id number] DOB: 1924-11-09 DOA: 12/29/2024 PCP: Charlott Dorn LABOR, MD    Brief Narrative:  89 year old with a history of atrial fibrillation on Eliquis , CKD stage II, GERD, HLD, OA, and osteoporosis who presented to the ER 1/14 after suffering a mechanical fall without loss of consciousness.  She had immediate left hip and shoulder pain.  In the ER x-rays of the left shoulder revealed an impacted and displaced fracture of the left proximal humerus.  Pelvis x-rays revealed an acute mildly angulated moderately displaced left intertrochanteric femur fracture.  CT head and cervical spine was without acute findings.  CT abdomen and pelvis was without acute findings.  Goals of Care:   Code Status: Limited: Do not attempt resuscitation (DNR) -DNR-LIMITED -Do Not Intubate/DNI    DVT prophylaxis: apixaban  (ELIQUIS ) tablet 2.5 mg Start: 12/30/24 1000 SCDs Start: 12/29/24 1650 apixaban  (ELIQUIS ) tablet 2.5 mg   Interim Hx: Afebrile.  Vital signs stable.  Creatinine is normalized.  Electrolytes balanced.  Hemoglobin stable.  Resting comfortably in bed.  Was able to eat a little bit today.  Has not yet moved her bowels.  Reports pain is reasonably controlled.  Rested reasonably well last night.  Assessment & Plan:  Displaced left intertrochanteric femur fracture status post mechanical fall Status post cephalomedullary nailing 1/14 afternoon - postoperative care per Orthopedics as follows: Weightbearing: WBAT LLE.  NWB LUE ROM:  LLE - unrestricted ROM   LUE - maintain sling.  Okay for elbow and wrist motion Incisional and dressing care: Dressing removed. Ok to leave open to air Showering: Okay to begin getting LLE incisions wet starting 01/01/2025 Orthopedic device(s): Sling LUE  VTE prophylaxis: home dose Eliquis .  Continue SCDs Dispo: PT/OT recommending SNF at discharge.  Ortho issues stable.  Okay for discharge from ortho standpoint once cleared by medicine team and  therapies  Left proximal humerus fracture status post mechanical fall Care as per Orthopedics with nonoperative management - to utilize sling with nonweightbearing precautions as noted above  Chronic atrial fibrillation on anticoagulation Eliquis  per home dose - heart rate controlled  Constipation Continue bowel regimen  CKD stage II baseline crt ~ 0.8 - slight increase w/ surgery - improved again with simple volume expansion  GERD Continue usual Protonix  Family Communication:   Disposition:  Skilled Nursing-Short Term Rehab (<3 Hours/Day)12/31/2024 1200 -anticipate discharge 1/19  Objective: Blood pressure 137/65, pulse 82, temperature 97.6 F (36.4 C), temperature source Oral, resp. rate 16, height 5' (1.524 m), weight 49.9 kg, SpO2 94%.  Intake/Output Summary (Last 24 hours) at 01/02/2025 0911 Last data filed at 01/02/2025 0410 Gross per 24 hour  Intake 1305.88 ml  Output 3800 ml  Net -2494.12 ml   Filed Weights   12/29/24 0709 12/29/24 1349  Weight: 49.9 kg 49.9 kg    Examination: General: No acute respiratory distress Lungs: Clear to auscultation bilaterally  Cardiovascular: Regular rate w/o M or rub Abdomen: NT/ND, soft, bs+ Extremities: No significant edema B LE   CBC: Recent Labs  Lab 12/29/24 0830 12/29/24 0839 12/31/24 0456 01/01/25 0433 01/02/25 0447  WBC 9.4   < > 6.9 5.8 5.0  NEUTROABS 8.3*  --   --   --   --   HGB 11.9*   < > 8.1* 7.9* 7.7*  HCT 37.3   < > 24.7* 24.8* 23.5*  MCV 98.4   < > 96.1 99.2 96.3  PLT 137*   < > 109* 98* 123*   < > =  values in this interval not displayed.   Basic Metabolic Panel: Recent Labs  Lab 12/30/24 0531 12/31/24 0456 01/01/25 0433 01/02/25 0447  NA 138 134* 136 137  K 4.6 4.7 4.5 4.2  CL 104 102 105 104  CO2 24 23 20* 22  GLUCOSE 131* 87 76 78  BUN 25* 38* 36* 33*  CREATININE 0.92 1.34* 1.22* 0.94  CALCIUM  8.8* 9.0 8.7* 8.4*  MG 1.7  --   --  2.1   GFR: Estimated Creatinine Clearance: 24 mL/min  (by C-G formula based on SCr of 0.94 mg/dL).   Scheduled Meds:  acetaminophen   650 mg Oral Q6H   apixaban   2.5 mg Oral BID   ascorbic acid   500 mg Oral Daily   bacitracin   1 Application Topical Daily   calcium -vitamin D   1 tablet Oral Q1200   celecoxib   200 mg Oral BID   Chlorhexidine  Gluconate Cloth  6 each Topical Daily   cholecalciferol   1,000 Units Oral Daily   metoprolol  succinate  37.5 mg Oral QHS   senna  1 tablet Oral Daily      LOS: 4 days   Reyes IVAR Moores, MD [facility] Office  [phone] Pager - Text Page per [place]  If 7PM-7AM, please contact night-coverage per Amion 01/02/2025, 9:11 AM     "

## 2025-01-02 NOTE — Plan of Care (Signed)
 Patient is calm with relatives at bedside   Problem: Clinical Measurements: Goal: Will remain free from infection Outcome: Progressing   Problem: Activity: Goal: Risk for activity intolerance will decrease Outcome: Progressing   Problem: Nutrition: Goal: Adequate nutrition will be maintained Outcome: Progressing   Problem: Coping: Goal: Level of anxiety will decrease Outcome: Progressing   Problem: Elimination: Goal: Will not experience complications related to bowel motility Outcome: Progressing   Problem: Pain Managment: Goal: General experience of comfort will improve and/or be controlled Outcome: Progressing   Problem: Safety: Goal: Ability to remain free from injury will improve Outcome: Progressing   Problem: Skin Integrity: Goal: Risk for impaired skin integrity will decrease Outcome: Progressing   Problem: Nutritional: Goal: Will attain and maintain optimal nutritional status Outcome: Progressing

## 2025-01-03 MED ORDER — METOPROLOL SUCCINATE ER 25 MG PO TB24: 37.5000 mg | ORAL_TABLET | Freq: Every day | ORAL | Status: AC

## 2025-01-03 MED ORDER — POLYETHYLENE GLYCOL 3350 17 G PO PACK: 17.0000 g | PACK | Freq: Two times a day (BID) | ORAL | Status: AC

## 2025-01-03 MED ORDER — SENNA 8.6 MG PO TABS: 1.0000 | ORAL_TABLET | Freq: Every day | ORAL | Status: AC

## 2025-01-03 MED ORDER — CELECOXIB 200 MG PO CAPS: 200.0000 mg | ORAL_CAPSULE | Freq: Two times a day (BID) | ORAL | Status: AC

## 2025-01-03 MED ORDER — MAGNESIUM CITRATE PO SOLN
0.5000 | Freq: Once | ORAL | Status: DC
  Filled 2025-01-03: qty 296

## 2025-01-03 MED ORDER — LACTULOSE 10 GM/15ML PO SOLN
30.0000 g | Freq: Once | ORAL | Status: AC
  Administered 2025-01-03: 30 g via ORAL
  Filled 2025-01-03: qty 45

## 2025-01-03 NOTE — Progress Notes (Signed)
 This nurse gave report to [facility] RN at [employer]. Awaiting PTAR.

## 2025-01-03 NOTE — Discharge Summary (Signed)
 "  DISCHARGE SUMMARY  Jessica Berg  MR#: [id number]  DOB:October 06, 1924  Date of Admission: 12/29/2024 Date of Discharge: 01/03/2025  Attending Physician:Jessica Yardley [name], MD  Patient's ERE:Yzwizmdnw, Dorn LABOR, MD  Disposition: D/C to SNF for rehab stay   Follow-up Appts:  Contact information for follow-up providers     [name], [name], MD. Schedule an appointment as soon as possible for a visit in 2 week(s).   Specialty: Orthopedic Surgery Why: for wound check and repeat x-rays Contact information: [address] [place] [address] [phone]              Contact information for after-discharge care     Destination     [name]   Service: Skilled Nursing Contact information: [address] [place]  [phone]                     Tests Needing Follow-up: -assure patient is having regular bowel movements -discontinue foley catheter once patient becomes more mobile   Discharge Diagnoses: Displaced left intertrochanteric femur fracture status post mechanical fall Status post cephalomedullary nailing 1/14 Left proximal humerus fracture status post mechanical fall Postoperative urinary retention  Chronic atrial fibrillation on anticoagulation Constipation CKD stage II GERD  Initial presentation: 89 year old with a history of atrial fibrillation on Eliquis , CKD stage II, GERD, HLD, OA, and osteoporosis who presented to the ER 1/14 after suffering a mechanical fall without loss of consciousness. She had immediate left hip and shoulder pain. In the ER x-rays of the left shoulder revealed an impacted and displaced fracture of the left proximal humerus. Pelvis x-rays revealed an acute mildly angulated moderately displaced left intertrochanteric femur fracture. CT head and cervical spine was without acute findings. CT abdomen and pelvis was without acute findings.   Hospital  Course:  Displaced left intertrochanteric femur fracture status post mechanical fall Status post cephalomedullary nailing 1/14 afternoon - postoperative care per Orthopedics as follows: Weightbearing: WBAT LLE.  NWB LUE ROM:  LLE - unrestricted ROM   LUE - maintain sling.  Okay for elbow and wrist motion Incisional and dressing care: Dressing removed. Ok to leave open to air Showering: Okay to begin getting LLE incisions wet starting 01/01/2025 Orthopedic device(s): Sling LUE  VTE prophylaxis: home dose Eliquis .  Dispo: PT/OT recommending SNF at discharge.  Ortho issues stable.  Okay for discharge from ortho standpoint once cleared by medicine team and therapies   Left proximal humerus fracture status post mechanical fall Care as per Orthopedics with nonoperative management - to utilize sling with non-weightbearing precautions as noted above  Postoperative urinary retention  Due to limited mobility - foley cath placed after multiple required episodes of I&O cath - foley to remain until patient making more progress w/ mobility at SNF facility    Chronic atrial fibrillation on anticoagulation Eliquis  per home dose - heart rate controlled   Constipation Continue bowel regimen - anticipate improved bowel function once rehab begins/pt becomes more mobile    CKD stage II baseline crt ~ 0.8 - slight increase w/ surgery - improved again with simple volume expansion   GERD Continue usual Protonix  Allergies as of 01/03/2025       Reactions   Alendronate Other (See Comments)    Unknown   Calcitonin Nausea And Vomiting   Gabapentin Other (See Comments)   Blurred vision   Hydrocodone -acetaminophen  Other (See Comments)   Dizziness    Latex Other (See Comments)  Unknown   Meperidine  Hcl Nausea And Vomiting   Oxycodone -acetaminophen  Other (See Comments)   Unknown   Pravastatin Other (See Comments)   Unknown   Raloxifene Other (See Comments)    Unknown   Risedronate Other (See  Comments)    Unknown   Tramadol  Hcl Other (See Comments)   Does not feel right   Darvocet [propoxyphene N-acetaminophen ] Nausea And Vomiting   Demerol  Hives   Etodolac Hives, Rash   Unknown to patient   Hydrocodone  Itching   Meprozine [meperidine -promethazine] Other (See Comments)   Reaction=tongue swelling   Penicillins Rash   Percocet [oxycodone -acetaminophen ] Nausea And Vomiting   Dizziness    Ultracet [tramadol -acetaminophen ] Other (See Comments)   headache        Medication List     TAKE these medications    acetaminophen  325 MG tablet Commonly known as: TYLENOL  Take 2 tablets (650 mg total) by mouth every 6 (six) hours as needed for mild pain (pain score 1-3), moderate pain (pain score 4-6), fever or headache.   apixaban  2.5 MG Tabs tablet Commonly known as: Eliquis  Take 1 tablet (2.5 mg total) by mouth 2 (two) times daily.   ascorbic acid  500 MG tablet Commonly known as: VITAMIN C  Take 500 mg by mouth daily.   Calcium  Carbonate-Vitamin D3 600-400 MG-UNIT Tabs Take 1 tablet by mouth daily.   celecoxib  200 MG capsule Commonly known as: CELEBREX  Take 1 capsule (200 mg total) by mouth 2 (two) times daily. What changed: when to take this   cholecalciferol  1000 units tablet Commonly known as: VITAMIN D  Take 1,000 Units by mouth daily.   metoprolol  succinate 25 MG 24 hr tablet Commonly known as: TOPROL -XL Take 1.5 tablets (37.5 mg total) by mouth at bedtime.   oxyCODONE  5 MG immediate release tablet Commonly known as: Oxy IR/ROXICODONE  Take 1 tablet (5 mg total) by mouth every 4 (four) hours as needed for severe pain (pain score 7-10).   polyethylene glycol 17 g packet Commonly known as: MIRALAX  / GLYCOLAX  Take 17 g by mouth 2 (two) times daily.   senna 8.6 MG Tabs tablet Commonly known as: SENOKOT Take 1 tablet (8.6 mg total) by mouth daily. Start taking on: January 04, 2025        Day of Discharge BP (!) 98/48   Pulse 79   Temp 98.4 F (36.9  C) (Oral)   Resp 18   Ht 5' (1.524 m)   Wt 49.9 kg   SpO2 97%   BMI 21.48 kg/m   Physical Exam: General: No acute respiratory distress Lungs: Clear to auscultation bilaterally without wheezes or crackles Cardiovascular: Regular rate and rhythm without murmur gallop or rub normal S1 and S2 Abdomen: Nontender, nondistended, soft, bowel sounds positive, no rebound, no ascites, no appreciable mass Extremities: No significant cyanosis, clubbing, or edema bilateral lower extremities  Basic Metabolic Panel: Recent Labs  Lab 12/29/24 0830 12/29/24 0839 12/30/24 0531 12/31/24 0456 01/01/25 0433 01/02/25 0447  NA 142 142 138 134* 136 137  K 4.2 4.1 4.6 4.7 4.5 4.2  CL 105 103 104 102 105 104  CO2 27  --  24 23 20* 22  GLUCOSE 123* 120* 131* 87 76 78  BUN 30* 32* 25* 38* 36* 33*  CREATININE 0.87 0.90 0.92 1.34* 1.22* 0.94  CALCIUM  9.3  --  8.8* 9.0 8.7* 8.4*  MG  --   --  1.7  --   --  2.1    CBC: Recent Labs  Lab 12/29/24  0830 12/29/24 0839 12/30/24 0531 12/31/24 0456 01/01/25 0433 01/02/25 0447  WBC 9.4  --  8.5 6.9 5.8 5.0  NEUTROABS 8.3*  --   --   --   --   --   HGB 11.9* 12.2 9.4* 8.1* 7.9* 7.7*  HCT 37.3 36.0 28.2* 24.7* 24.8* 23.5*  MCV 98.4  --  96.6 96.1 99.2 96.3  PLT 137*  --  112* 109* 98* 123*    Time spent in discharge (includes decision making & examination of pt): 35 minutes  01/03/2025, 2:24 PM   Jessica IVAR Moores, MD [facility] Office  [phone]      "

## 2025-01-03 NOTE — TOC Transition Note (Signed)
 Transition of Care [facility]) - Discharge Note   Patient Details  Name: Jessica Berg MRN: [id number] Date of Birth: Aug 20, 1924  Transition of Care [facility]) CM/SW Contact:  Bridget Cordella Simmonds, LCSW Phone Number: 01/03/2025, 2:57 PM   Clinical Narrative:   Pt discharging to [facility], RN call report to [phone].   1450: PT called.  1100: CSW confirmed with [facility]: they can receive pt today, does need BM prior to DC.  Team aware.  Final next level of care: Skilled Nursing Facility Barriers to Discharge: Barriers Resolved   Patient Goals and CMS Choice     Choice offered to / list presented to : Adult Children      Discharge Placement              Patient chooses bed at: [facility], [place] Patient to be transferred to facility by: ptar Name of family member notified: multiple family members including one daughter in the room Patient and family notified of of transfer: 01/03/25  Discharge Plan and Services Additional resources added to the After Visit Summary for     Discharge Planning Services: CM Consult                                 Social Drivers of Health (SDOH) Interventions SDOH Screenings   Food Insecurity: No Food Insecurity (12/31/2024)  Housing: Low Risk (12/31/2024)  Transportation Needs: No Transportation Needs (12/31/2024)  Utilities: Not At Risk (12/31/2024)  Tobacco Use: Low Risk (12/29/2024)     Readmission Risk Interventions     No data to display

## 2025-01-03 NOTE — Care Management Important Message (Signed)
 Important Message  Patient Details  Name: Jessica Berg MRN: [id number] Date of Birth: [birth year]   Important Message Given:  Yes - Medicare IM     Jennie Laneta Dragon 01/03/2025, 3:20 PM

## 2025-01-03 NOTE — Plan of Care (Signed)
  Problem: Health Behavior/Discharge Planning: Goal: Ability to manage health-related needs will improve Outcome: Progressing   Problem: Clinical Measurements: Goal: Respiratory complications will improve Outcome: Progressing   Problem: Activity: Goal: Risk for activity intolerance will decrease Outcome: Progressing   

## 2025-01-17 ENCOUNTER — Emergency Department (HOSPITAL_COMMUNITY)

## 2025-01-17 ENCOUNTER — Other Ambulatory Visit: Payer: Self-pay

## 2025-01-17 ENCOUNTER — Encounter (HOSPITAL_COMMUNITY): Payer: Self-pay | Admitting: Internal Medicine

## 2025-01-17 ENCOUNTER — Observation Stay (HOSPITAL_COMMUNITY)

## 2025-01-17 ENCOUNTER — Observation Stay (HOSPITAL_COMMUNITY)
Admission: EM | Admit: 2025-01-17 | Discharge: 2025-01-19 | DRG: 698 | Disposition: A | Source: Skilled Nursing Facility | Attending: Internal Medicine | Admitting: Internal Medicine

## 2025-01-17 DIAGNOSIS — E860 Dehydration: Secondary | ICD-10-CM | POA: Diagnosis not present

## 2025-01-17 DIAGNOSIS — G9341 Metabolic encephalopathy: Secondary | ICD-10-CM | POA: Diagnosis present

## 2025-01-17 DIAGNOSIS — E7800 Pure hypercholesterolemia, unspecified: Secondary | ICD-10-CM | POA: Diagnosis present

## 2025-01-17 DIAGNOSIS — N1832 Chronic kidney disease, stage 3b: Secondary | ICD-10-CM | POA: Diagnosis present

## 2025-01-17 DIAGNOSIS — T83511A Infection and inflammatory reaction due to indwelling urethral catheter, initial encounter: Principal | ICD-10-CM | POA: Diagnosis present

## 2025-01-17 DIAGNOSIS — Z8261 Family history of arthritis: Secondary | ICD-10-CM

## 2025-01-17 DIAGNOSIS — R410 Disorientation, unspecified: Secondary | ICD-10-CM | POA: Diagnosis not present

## 2025-01-17 DIAGNOSIS — G9340 Encephalopathy, unspecified: Secondary | ICD-10-CM | POA: Diagnosis present

## 2025-01-17 DIAGNOSIS — N390 Urinary tract infection, site not specified: Secondary | ICD-10-CM | POA: Diagnosis not present

## 2025-01-17 DIAGNOSIS — Z515 Encounter for palliative care: Secondary | ICD-10-CM

## 2025-01-17 DIAGNOSIS — R0902 Hypoxemia: Secondary | ICD-10-CM | POA: Diagnosis present

## 2025-01-17 DIAGNOSIS — Z1152 Encounter for screening for COVID-19: Secondary | ICD-10-CM

## 2025-01-17 DIAGNOSIS — Z7901 Long term (current) use of anticoagulants: Secondary | ICD-10-CM

## 2025-01-17 DIAGNOSIS — T83511S Infection and inflammatory reaction due to indwelling urethral catheter, sequela: Secondary | ICD-10-CM | POA: Diagnosis not present

## 2025-01-17 DIAGNOSIS — F0390 Unspecified dementia without behavioral disturbance: Secondary | ICD-10-CM | POA: Diagnosis not present

## 2025-01-17 DIAGNOSIS — I10 Essential (primary) hypertension: Secondary | ICD-10-CM | POA: Diagnosis present

## 2025-01-17 DIAGNOSIS — K5900 Constipation, unspecified: Secondary | ICD-10-CM | POA: Diagnosis present

## 2025-01-17 DIAGNOSIS — I480 Paroxysmal atrial fibrillation: Secondary | ICD-10-CM | POA: Diagnosis present

## 2025-01-17 DIAGNOSIS — Z66 Do not resuscitate: Secondary | ICD-10-CM | POA: Diagnosis present

## 2025-01-17 DIAGNOSIS — Z79899 Other long term (current) drug therapy: Secondary | ICD-10-CM

## 2025-01-17 DIAGNOSIS — Y846 Urinary catheterization as the cause of abnormal reaction of the patient, or of later complication, without mention of misadventure at the time of the procedure: Secondary | ICD-10-CM | POA: Diagnosis present

## 2025-01-17 DIAGNOSIS — Z960 Presence of urogenital implants: Secondary | ICD-10-CM | POA: Diagnosis present

## 2025-01-17 DIAGNOSIS — I129 Hypertensive chronic kidney disease with stage 1 through stage 4 chronic kidney disease, or unspecified chronic kidney disease: Secondary | ICD-10-CM | POA: Diagnosis present

## 2025-01-17 DIAGNOSIS — I4891 Unspecified atrial fibrillation: Secondary | ICD-10-CM | POA: Diagnosis present

## 2025-01-17 DIAGNOSIS — K219 Gastro-esophageal reflux disease without esophagitis: Secondary | ICD-10-CM | POA: Diagnosis present

## 2025-01-17 DIAGNOSIS — Z8249 Family history of ischemic heart disease and other diseases of the circulatory system: Secondary | ICD-10-CM

## 2025-01-17 LAB — CBC WITH DIFFERENTIAL/PLATELET
Abs Immature Granulocytes: 0.04 10*3/uL (ref 0.00–0.07)
Basophils Absolute: 0.1 10*3/uL (ref 0.0–0.1)
Basophils Relative: 1 %
Eosinophils Absolute: 0.2 10*3/uL (ref 0.0–0.5)
Eosinophils Relative: 3 %
HCT: 39.6 % (ref 36.0–46.0)
Hemoglobin: 12.5 g/dL (ref 12.0–15.0)
Immature Granulocytes: 1 %
Lymphocytes Relative: 12 %
Lymphs Abs: 0.8 10*3/uL (ref 0.7–4.0)
MCH: 32.5 pg (ref 26.0–34.0)
MCHC: 31.6 g/dL (ref 30.0–36.0)
MCV: 102.9 fL — ABNORMAL HIGH (ref 80.0–100.0)
Monocytes Absolute: 0.6 10*3/uL (ref 0.1–1.0)
Monocytes Relative: 9 %
Neutro Abs: 5 10*3/uL (ref 1.7–7.7)
Neutrophils Relative %: 74 %
Platelets: 294 10*3/uL (ref 150–400)
RBC: 3.85 MIL/uL — ABNORMAL LOW (ref 3.87–5.11)
RDW: 15.4 % (ref 11.5–15.5)
WBC: 6.8 10*3/uL (ref 4.0–10.5)
nRBC: 0 % (ref 0.0–0.2)

## 2025-01-17 LAB — COMPREHENSIVE METABOLIC PANEL WITH GFR
ALT: <5 U/L (ref 0–44)
AST: 28 U/L (ref 15–41)
Albumin: 3.5 g/dL (ref 3.5–5.0)
Alkaline Phosphatase: 210 U/L — ABNORMAL HIGH (ref 38–126)
Anion gap: 10 (ref 5–15)
BUN: 23 mg/dL (ref 8–23)
CO2: 24 mmol/L (ref 22–32)
Calcium: 9.3 mg/dL (ref 8.9–10.3)
Chloride: 104 mmol/L (ref 98–111)
Creatinine, Ser: 0.96 mg/dL (ref 0.44–1.00)
GFR, Estimated: 53 mL/min — ABNORMAL LOW
Glucose, Bld: 89 mg/dL (ref 70–99)
Potassium: 4.5 mmol/L (ref 3.5–5.1)
Sodium: 138 mmol/L (ref 135–145)
Total Bilirubin: 1.2 mg/dL (ref 0.0–1.2)
Total Protein: 6.4 g/dL — ABNORMAL LOW (ref 6.5–8.1)

## 2025-01-17 LAB — I-STAT CHEM 8, ED
BUN: 24 mg/dL — ABNORMAL HIGH (ref 8–23)
Calcium, Ion: 1.17 mmol/L (ref 1.15–1.40)
Chloride: 103 mmol/L (ref 98–111)
Creatinine, Ser: 1.1 mg/dL — ABNORMAL HIGH (ref 0.44–1.00)
Glucose, Bld: 89 mg/dL (ref 70–99)
HCT: 38 % (ref 36.0–46.0)
Hemoglobin: 12.9 g/dL (ref 12.0–15.0)
Potassium: 4.2 mmol/L (ref 3.5–5.1)
Sodium: 140 mmol/L (ref 135–145)
TCO2: 24 mmol/L (ref 22–32)

## 2025-01-17 LAB — RESP PANEL BY RT-PCR (RSV, FLU A&B, COVID)  RVPGX2
Influenza A by PCR: NEGATIVE
Influenza B by PCR: NEGATIVE
Resp Syncytial Virus by PCR: NEGATIVE
SARS Coronavirus 2 by RT PCR: NEGATIVE

## 2025-01-17 LAB — URINALYSIS, W/ REFLEX TO CULTURE (INFECTION SUSPECTED)
Bilirubin Urine: NEGATIVE
Glucose, UA: NEGATIVE mg/dL
Ketones, ur: NEGATIVE mg/dL
Nitrite: NEGATIVE
Protein, ur: 30 mg/dL — AB
Specific Gravity, Urine: 1.013 (ref 1.005–1.030)
pH: 6 (ref 5.0–8.0)

## 2025-01-17 LAB — CBG MONITORING, ED: Glucose-Capillary: 84 mg/dL (ref 70–99)

## 2025-01-17 LAB — MAGNESIUM: Magnesium: 2 mg/dL (ref 1.7–2.4)

## 2025-01-17 MED ORDER — DOCUSATE SODIUM 100 MG PO CAPS
100.0000 mg | ORAL_CAPSULE | Freq: Two times a day (BID) | ORAL | Status: DC
  Administered 2025-01-17 – 2025-01-18 (×3): 100 mg via ORAL
  Filled 2025-01-17 (×2): qty 1

## 2025-01-17 MED ORDER — IOHEXOL 350 MG/ML SOLN
75.0000 mL | Freq: Once | INTRAVENOUS | Status: AC | PRN
  Administered 2025-01-17: 75 mL via INTRAVENOUS

## 2025-01-17 MED ORDER — POLYETHYLENE GLYCOL 3350 17 G PO PACK: 17.0000 g | PACK | Freq: Every day | ORAL | Status: DC

## 2025-01-17 NOTE — Assessment & Plan Note (Signed)
 Change ABX from Levaquin to rocephin  Given encephalopathy symptoms Levaquin and elderly may contribute to altered mental status Await results of urine culture

## 2025-01-17 NOTE — Assessment & Plan Note (Addendum)
 Gently rehydrate follow fluid status, avoid over aggressive fluid resuscitation

## 2025-01-17 NOTE — Assessment & Plan Note (Signed)
"  -   most likely multifactorial secondary to combination of  infection  dehydration secondary to decreased by mouth intake,  polypharmacy   - Will rehydrate   - treat underlining infection   - Hold contributing medications   Discussed with family who would like to avoid over aggressive interventions such as MRI at this time   - neurological exam appears to be nonfocal but patient unable to cooperate fully   Palliative care consult  Hold oxycodone   Change abx from levofloxacin to Rocephin  Patient have had decreased p.o. intake will rehydrate  "

## 2025-01-17 NOTE — ED Notes (Signed)
 Labs not collected phlebotomy reports that provider verbally cancelled orders pending making patient comfort Care.

## 2025-01-17 NOTE — Assessment & Plan Note (Signed)
 Continue eliquis for now.

## 2025-01-17 NOTE — Assessment & Plan Note (Signed)
 Allow permissive HTN
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
## 2025-01-17 NOTE — ED Provider Notes (Signed)
 Patient care signed out to follow-up CT scan results, reassess and plan for admission.  Patient presented with altered mental status with episode of tongue to 1 side and confused.  History of dementia but this is worse than baseline.  Daughters in the room to help with details.  UTI currently on Levaquin.  Recent admission for hip surgery and arm fracture.  Patient in rehab facility.  On examination patient generally weak, slowly answers questions, daughter is in the room for details.  Vital signs reassuring weaned off oxygen.  CT scans reviewed no acute abnormalities of the head neck or chest no blood clots in the lungs.  With patient's worsening confusion and transient episodes concern for TIA or stroke, medication side effects, metabolic, other cause such as currently treated UTI.  Patient DNR, daughters open to hospice discussion in the near future.  Discussed with Dr. Silvester who will evaluate the patient.   Tonia Chew, MD 01/17/25 [phone]

## 2025-01-17 NOTE — ED Notes (Signed)
"  Report called to 2W RN  "

## 2025-01-17 NOTE — Assessment & Plan Note (Signed)
Monitor for sun downing 
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
## 2025-01-17 NOTE — Assessment & Plan Note (Signed)
-  chronic avoid nephrotoxic medications such as NSAIDs, Vanco Zosyn combo,  avoid hypotension, continue to follow renal function
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
## 2025-01-18 ENCOUNTER — Encounter (HOSPITAL_COMMUNITY): Payer: Self-pay | Admitting: Internal Medicine

## 2025-01-18 DIAGNOSIS — G9340 Encephalopathy, unspecified: Secondary | ICD-10-CM | POA: Diagnosis not present

## 2025-01-18 LAB — COMPREHENSIVE METABOLIC PANEL WITH GFR
ALT: <5 U/L (ref 0–44)
AST: 22 U/L (ref 15–41)
Albumin: 3.2 g/dL — ABNORMAL LOW (ref 3.5–5.0)
Alkaline Phosphatase: 192 U/L — ABNORMAL HIGH (ref 38–126)
Anion gap: 10 (ref 5–15)
BUN: 20 mg/dL (ref 8–23)
CO2: 23 mmol/L (ref 22–32)
Calcium: 9.1 mg/dL (ref 8.9–10.3)
Chloride: 105 mmol/L (ref 98–111)
Creatinine, Ser: 0.73 mg/dL (ref 0.44–1.00)
GFR, Estimated: >60 mL/min
Glucose, Bld: 77 mg/dL (ref 70–99)
Potassium: 4.1 mmol/L (ref 3.5–5.1)
Sodium: 138 mmol/L (ref 135–145)
Total Bilirubin: 1 mg/dL (ref 0.0–1.2)
Total Protein: 5.7 g/dL — ABNORMAL LOW (ref 6.5–8.1)

## 2025-01-18 LAB — CBC
HCT: 39 % (ref 36.0–46.0)
Hemoglobin: 12.2 g/dL (ref 12.0–15.0)
MCH: 32.4 pg (ref 26.0–34.0)
MCHC: 31.3 g/dL (ref 30.0–36.0)
MCV: 103.4 fL — ABNORMAL HIGH (ref 80.0–100.0)
Platelets: 236 10*3/uL (ref 150–400)
RBC: 3.77 MIL/uL — ABNORMAL LOW (ref 3.87–5.11)
RDW: 15.7 % — ABNORMAL HIGH (ref 11.5–15.5)
WBC: 5.6 10*3/uL (ref 4.0–10.5)
nRBC: 0 % (ref 0.0–0.2)

## 2025-01-18 LAB — URINE CULTURE: Culture: NO GROWTH

## 2025-01-18 LAB — VITAMIN B12: Vitamin B-12: 536 pg/mL (ref 180–914)

## 2025-01-18 LAB — AMMONIA: Ammonia: 20 umol/L (ref 9–35)

## 2025-01-18 LAB — PHOSPHORUS: Phosphorus: 3 mg/dL (ref 2.5–4.6)

## 2025-01-18 LAB — TSH: TSH: 14.6 u[IU]/mL — ABNORMAL HIGH (ref 0.350–4.500)

## 2025-01-18 LAB — FOLATE: Folate: 13.3 ng/mL

## 2025-01-18 LAB — MAGNESIUM: Magnesium: 2 mg/dL (ref 1.7–2.4)

## 2025-01-18 MED ORDER — GLUCAGON HCL RDNA (DIAGNOSTIC) 1 MG IJ SOLR: 1.0000 mg | INTRAMUSCULAR | Status: DC | PRN

## 2025-01-18 MED ORDER — METOPROLOL SUCCINATE ER 25 MG PO TB24: 37.5000 mg | ORAL_TABLET | Freq: Every day | ORAL | Status: DC

## 2025-01-18 MED ORDER — CHLORHEXIDINE GLUCONATE CLOTH 2 % EX PADS
6.0000 | MEDICATED_PAD | Freq: Every day | CUTANEOUS | Status: DC
  Administered 2025-01-18 – 2025-01-19 (×2): 6 via TOPICAL

## 2025-01-18 MED ORDER — ONDANSETRON HCL 4 MG PO TABS: 4.0000 mg | ORAL_TABLET | Freq: Four times a day (QID) | ORAL | Status: DC | PRN

## 2025-01-18 MED ORDER — FERROUS SULFATE 325 (65 FE) MG PO TABS
325.0000 mg | ORAL_TABLET | ORAL | Status: DC
  Administered 2025-01-19: 325 mg via ORAL
  Filled 2025-01-18: qty 1

## 2025-01-18 MED ORDER — SODIUM CHLORIDE 0.9 % IV SOLN: INTRAVENOUS | Status: AC

## 2025-01-18 MED ORDER — METOPROLOL TARTRATE 5 MG/5ML IV SOLN: 5.0000 mg | INTRAVENOUS | Status: DC | PRN

## 2025-01-18 MED ORDER — SODIUM CHLORIDE 0.9 % IV SOLN
1.0000 g | INTRAVENOUS | Status: DC
  Administered 2025-01-18 – 2025-01-19 (×2): 1 g via INTRAVENOUS
  Filled 2025-01-18 (×2): qty 10

## 2025-01-18 MED ORDER — OYSTER SHELL CALCIUM/D3 500-5 MG-MCG PO TABS
1.0000 | ORAL_TABLET | Freq: Every day | ORAL | Status: DC
  Administered 2025-01-18 – 2025-01-19 (×2): 1 via ORAL
  Filled 2025-01-18 (×2): qty 1

## 2025-01-18 MED ORDER — VITAMIN C 500 MG PO TABS
500.0000 mg | ORAL_TABLET | Freq: Every day | ORAL | Status: DC
  Administered 2025-01-18 – 2025-01-19 (×2): 500 mg via ORAL
  Filled 2025-01-18 (×2): qty 1

## 2025-01-18 MED ORDER — ONDANSETRON HCL 4 MG/2ML IJ SOLN: 4.0000 mg | Freq: Four times a day (QID) | INTRAMUSCULAR | Status: DC | PRN

## 2025-01-18 MED ORDER — VITAMIN D 25 MCG (1000 UNIT) PO TABS
1000.0000 [IU] | ORAL_TABLET | Freq: Every day | ORAL | Status: DC
  Administered 2025-01-18 – 2025-01-19 (×2): 1000 [IU] via ORAL
  Filled 2025-01-18 (×2): qty 1

## 2025-01-18 MED ORDER — ACETAMINOPHEN 500 MG PO TABS
1000.0000 mg | ORAL_TABLET | Freq: Three times a day (TID) | ORAL | Status: DC
  Administered 2025-01-18 – 2025-01-19 (×4): 1000 mg via ORAL
  Filled 2025-01-18 (×4): qty 2

## 2025-01-18 MED ORDER — LEVOTHYROXINE SODIUM 25 MCG PO TABS
50.0000 ug | ORAL_TABLET | Freq: Every day | ORAL | Status: DC
  Administered 2025-01-19: 50 ug via ORAL
  Filled 2025-01-18: qty 2

## 2025-01-18 MED ORDER — IPRATROPIUM-ALBUTEROL 0.5-2.5 (3) MG/3ML IN SOLN: 3.0000 mL | RESPIRATORY_TRACT | Status: DC | PRN

## 2025-01-18 MED ORDER — POLYETHYLENE GLYCOL 3350 17 G PO PACK
17.0000 g | PACK | Freq: Two times a day (BID) | ORAL | Status: DC
  Administered 2025-01-18 (×2): 17 g via ORAL
  Filled 2025-01-18 (×2): qty 1

## 2025-01-18 MED ORDER — APIXABAN 2.5 MG PO TABS
2.5000 mg | ORAL_TABLET | Freq: Two times a day (BID) | ORAL | Status: DC
  Administered 2025-01-18 – 2025-01-19 (×4): 2.5 mg via ORAL
  Filled 2025-01-18 (×4): qty 1

## 2025-01-18 MED ORDER — HYDRALAZINE HCL 20 MG/ML IJ SOLN: 10.0000 mg | INTRAMUSCULAR | Status: DC | PRN

## 2025-01-18 MED ORDER — METOPROLOL SUCCINATE ER 25 MG PO TB24
37.5000 mg | ORAL_TABLET | Freq: Every day | ORAL | Status: DC
  Administered 2025-01-18 – 2025-01-19 (×2): 37.5 mg via ORAL
  Filled 2025-01-18 (×2): qty 2

## 2025-01-18 MED ORDER — BISACODYL 5 MG PO TBEC
10.0000 mg | DELAYED_RELEASE_TABLET | Freq: Two times a day (BID) | ORAL | Status: DC
  Administered 2025-01-18 (×2): 10 mg via ORAL
  Filled 2025-01-18 (×2): qty 2

## 2025-01-18 MED ORDER — GUAIFENESIN 100 MG/5ML PO LIQD: 5.0000 mL | ORAL | Status: DC | PRN

## 2025-01-18 NOTE — Plan of Care (Signed)

## 2025-01-18 NOTE — Evaluation (Signed)
 Occupational Therapy Evaluation Patient Details Name: Jessica Berg MRN: [id number] DOB: 10-21-1924 Today's Date: 01/18/2025   History of Present Illness   89 y.o. female presents to [facility] hospital on 01/17/2025 with acute encephalopathy. Pt was recently hospitalized 1/14-1/19 after a fall and L humerus fx and L intertrochanteric femur fx s/p cephalomedullary nailing. Pt also being treated for UTI. PMH includes afib, GERD, HLD, dementia.     Clinical Impressions PTA,pt admitted from SNF ([facility]) where she was working with therapy. Prior to this, pt lived at home, ambulatory with Rollator and received light assist for ADLs from family/PCA. Pt presents now with deficits in cognition, strength, endurance, LUE use d/t fx and sitting balance. Pt required Total A x 2 for bed mobility to sit EOB and to laterally scoot along bedside. Pt limited by tachycardia (HR to 150bpm at one time) and fear of falling. Pt requires Max-Total A for ADLs d/t deficits. Recommend return to low intensity rehab setting to work towards improved ADL/mobility independence. Will continue to follow acutely.     If plan is discharge home, recommend the following:   A lot of help with walking and/or transfers;Two people to help with walking and/or transfers;Two people to help with bathing/dressing/bathroom     Functional Status Assessment   Patient has had a recent decline in their functional status and demonstrates the ability to make significant improvements in function in a reasonable and predictable amount of time.     Equipment Recommendations   Other (comment) (TBD)     Recommendations for Other Services   Speech consult     Precautions/Restrictions   Precautions Precautions: Fall Recall of Precautions/Restrictions: Impaired Precaution/Restrictions Comments: watch HR Required Braces or Orthoses: Sling (LUE) Restrictions Weight Bearing Restrictions Per Provider Order: Yes Other Position/Activity  Restrictions: LLE: unrestricted ROM & WBAT; LUE: okay for elbow and wrist motion though NWB     Mobility Bed Mobility Overal bed mobility: Needs Assistance Bed Mobility: Supine to Sit, Sit to Supine     Supine to sit: Total assist, +2 for physical assistance, +2 for safety/equipment Sit to supine: Total assist, +2 for physical assistance, +2 for safety/equipment   General bed mobility comments: Total A x 2 for bed mobility due to impaired initiation and sequencing. Pt endorsing fear of falling - required encouragement and education of safety precautions being implemented. Total A to return to bed    Transfers                   General transfer comment: Total A x 2 to scoot up in bed laterally using bed pad. Deferred standing transfers due to rising HR      Balance Overall balance assessment: Needs assistance Sitting-balance support: Single extremity supported, Feet supported Sitting balance-Leahy Scale: Poor Sitting balance - Comments: R lateral lean, able to correct with Min A and cues Postural control: Posterior lean, Right lateral lean                                 ADL either performed or assessed with clinical judgement   ADL Overall ADL's : Needs assistance/impaired Eating/Feeding: Minimal assistance;Sitting Eating/Feeding Details (indicate cue type and reason): Min A to hold/drink from cup. pt reports feeling like something was stuck in her throat - also some coughing noted - asked if SLP eval potentially needed Grooming: Minimal assistance;Sitting;Wash/dry face   Upper Body Bathing: Maximal assistance;Sitting   Lower Body Bathing:  Total assistance;Bed level   Upper Body Dressing : Maximal assistance;Sitting   Lower Body Dressing: Total assistance;Bed level       Toileting- Clothing Manipulation and Hygiene: Total assistance;Bed level               Vision Ability to See in Adequate Light: 0 Adequate Patient Visual Report: No change  from baseline Vision Assessment?: No apparent visual deficits     Perception         Praxis         Pertinent Vitals/Pain Pain Assessment Pain Assessment: No/denies pain     Extremity/Trunk Assessment Upper Extremity Assessment Upper Extremity Assessment: LUE deficits/detail LUE Deficits / Details: L proximal humerus fx, nonoperative currently, in sling. able to use hand/move elbow LUE Coordination: decreased gross motor   Lower Extremity Assessment Lower Extremity Assessment: Defer to PT evaluation   Cervical / Trunk Assessment Cervical / Trunk Assessment: Kyphotic   Communication Communication Communication: Impaired Factors Affecting Communication: Hearing impaired   Cognition Arousal: Alert Behavior During Therapy: Anxious, Lability Cognition: Cognition impaired   Orientation impairments: Place, Time, Situation Awareness: Intellectual awareness impaired, Online awareness impaired Memory impairment (select all impairments): Short-term memory, Declarative long-term memory, Working memory Attention impairment (select first level of impairment): Sustained attention Executive functioning impairment (select all impairments): Initiation, Organization, Sequencing, Reasoning, Problem solving OT - Cognition Comments: hx of dementia, pt pleasant- anxious with mobility and fearful of falling. Pt tearful at times talking about family history and past- tangential. Pt able to report being in [place] but unable to verbalize city or current hospital                 Following commands: Impaired Following commands impaired: Follows one step commands with increased time     Cueing  General Comments   Cueing Techniques: Verbal cues;Gestural cues;Tactile cues;Visual cues      Exercises     Shoulder Instructions      Home Living Family/patient expects to be discharged to:: Skilled nursing facility                                 Additional Comments: Pt  admitted from [facility] where she was working with rehab. Prior to initial hospitalization, pt had PCA assist 9-12 and 5-8, 3-7 on weekends with children helping at other times during the day. No assist at night      Prior Functioning/Environment Prior Level of Function : Needs assist             Mobility Comments: previously ambulatory with Rollator. ADLs Comments: Per chart, family and PCA previously assisting with bathing and dressing. Pt was typically able to toilet self and manage meds    OT Problem List: Decreased strength;Decreased range of motion;Decreased activity tolerance;Impaired balance (sitting and/or standing);Impaired UE functional use;Pain;Decreased coordination;Decreased safety awareness;Decreased cognition;Decreased knowledge of use of DME or AE;Decreased knowledge of precautions   OT Treatment/Interventions: Self-care/ADL training;Therapeutic exercise;DME and/or AE instruction;Energy conservation;Therapeutic activities;Patient/family education;Balance training      OT Goals(Current goals can be found in the care plan section)   Acute Rehab OT Goals Patient Stated Goal: pt wants to avoid any other falls OT Goal Formulation: With patient Time For Goal Achievement: 02/01/25 Potential to Achieve Goals: Good   OT Frequency:  Min 2X/week    Co-evaluation PT/OT/SLP Co-Evaluation/Treatment: Yes Reason for Co-Treatment: Complexity of the patient's impairments (multi-system involvement);For patient/therapist safety;To address functional/ADL transfers PT goals  addressed during session: Balance OT goals addressed during session: ADL's and self-care      AM-PAC OT 6 Clicks Daily Activity     Outcome Measure Help from another person eating meals?: A Little Help from another person taking care of personal grooming?: A Little Help from another person toileting, which includes using toliet, bedpan, or urinal?: Total Help from another person bathing (including  washing, rinsing, drying)?: A Lot Help from another person to put on and taking off regular upper body clothing?: A Lot Help from another person to put on and taking off regular lower body clothing?: Total 6 Click Score: 12   End of Session Equipment Utilized During Treatment: Other (comment) (sling) Nurse Communication: Other (comment) (need for SLP eval?)  Activity Tolerance: Treatment limited secondary to medical complications (Comment) (limited by rising HR) Patient left: in bed;with call bell/phone within reach;with bed alarm set;with family/visitor present;Other (comment) (MD entering)  OT Visit Diagnosis: Unsteadiness on feet (R26.81);Other abnormalities of gait and mobility (R26.89);Muscle weakness (generalized) (M62.81)                Time: 9193-9168 OT Time Calculation (min): 25 min Charges:  OT General Charges $OT Visit: 1 Visit OT Evaluation $OT Eval Moderate Complexity: 1 Mod  Jessica Berg, OTR/L Acute Rehab Services Office: [phone]   Jessica Fish 01/18/2025, 8:57 AM

## 2025-01-18 NOTE — Care Management Obs Status (Signed)
 MEDICARE OBSERVATION STATUS NOTIFICATION   Patient Details  Name: Jessica Berg MRN: [id number] Date of Birth: 12-Apr-1924   Medicare Observation Status Notification Given:  Yes   OBS NOTICE SIGNED AND COPY GIVEN Khadeeja Elden 01/18/2025, 11:17 AM

## 2025-01-18 NOTE — Plan of Care (Signed)
 Palliative:  Reviewed chart. Discussed with Dr. Caleen. Plans for home with hospice already in the works. No acute palliative needs at this time. Please let me know if palliative can assist in any way.   No charge  Bernarda Kitty, NP Palliative Medicine Team Pager [phone] (Please see [email or web address] for schedule) Team Phone [phone]

## 2025-01-18 NOTE — Progress Notes (Signed)
 " PROGRESS NOTE    Jessica Berg  FMW:[id number] DOB: 1924/10/07 DOA: 01/17/2025 PCP: Charlott Dorn LABOR, MD    Brief Narrative:   89 year old with history of A-fib on Eliquis , GERD, HLD, recent left hip fracture, CKD stage II, left proximal humerus fracture, GERD, urinary retention, constipation, dementia presented with acute encephalopathy from SNF.  Patient was recently treated for left hip and humerus fracture along with a UTI.  Due to urinary retention, Foley catheter was placed.  Over the weekend due to poor weather, unable to be seen by family but eventually was noted to be encephalopathic therefore brought to the hospital.  CT of the head unremarkable, CTA head and neck does not show any LVO.  KUB showed moderate stool burden.  CTA chest was negative for PE.  Assessment & Plan:  Acute encephalopathy Catheter associated urinary tract infection, PTA -Multifactorial in nature with combination of dehydration, polypharmacy, recent urinary tract infection - Currently ongoing gentle hydration.  CT head and CTA head and neck are unremarkable.  Currently holding any sedative medications. -TSH, ammonia, B12, folate -Holding Levaquin, for now will give IV Rocephin  -Discontinue Foley, will reinsert if necessary  Moderate constipation - Bowel regimen  Essential hypertension - On metoprolol   Atrial fibrillation, paroxysmal - On Eliquis   CKD stage II - Creatinine around baseline 1.0  Recent fall with left femur fracture Left proximal humerus fracture - Status post ORIF of the left femur on 1/14.  Routine postop recovery.  Already on Eliquis . -Nonoperative left humerus, to utilize sling  History of dementia  Urinary retention - Foley catheter placed during recent admission  GERD - PPI  Goals of care discussion-family leaning towards hospice with comfort care  DVT prophylaxis: apixaban  (ELIQUIS ) tablet 2.5 mg Start: 01/18/25 0300 apixaban  (ELIQUIS ) tablet 2.5 mg       Code Status: Limited: Do not attempt resuscitation (DNR) -DNR-LIMITED -Do Not Intubate/DNI  Family Communication:      PT Follow up Recs:   Subjective: Patient seen and examined at bedside.  Son, daughter and daughter-in-law are present at bedside.  I have discussed patient's care in detail regarding acute conditions and comorbidities.  They understand that patient is not critical condition and currently would like to discuss comfort care/hospice with hospice liaison.  Have consulted TOC   Examination:  General exam: Appears calm and comfortable  Respiratory system: Clear to auscultation. Respiratory effort normal. Cardiovascular system: S1 & S2 heard, RRR. No JVD, murmurs, rubs, gallops or clicks. No pedal edema. Gastrointestinal system: Abdomen is nondistended, soft and nontender. No organomegaly or masses felt. Normal bowel sounds heard. Central nervous system: Alert and oriented. No focal neurological deficits. Extremities: Symmetric 5 x 5 power. Skin: No rashes, lesions or ulcers Psychiatry: Judgement and insight appear normal. Mood & affect appropriate.                Diet Orders (From admission, onward)     Start     Ordered   01/18/25 0152  DIET SOFT Room service appropriate? Yes; Fluid consistency: Thin  Diet effective now       Question Answer Comment  Room service appropriate? Yes   Fluid consistency: Thin      01/18/25 0151            Objective: Vitals:   01/18/25 0029 01/18/25 0424 01/18/25 0756 01/18/25 0800  BP: 101/63 113/76 109/71   Pulse:  89 95 (!) 118  Resp:      Temp: 97.8 F (36.6 C)  97.6 F (36.4 C) 97.9 F (36.6 C)   TempSrc:      SpO2: 92% 99% 100%   Weight:      Height:        Intake/Output Summary (Last 24 hours) at 01/18/2025 1003 Last data filed at 01/18/2025 9365 Gross per 24 hour  Intake 260 ml  Output 875 ml  Net -615 ml   Filed Weights   01/17/25 1403  Weight: 52.2 kg    Scheduled Meds:  acetaminophen   1,000  mg Oral TID   apixaban   2.5 mg Oral BID   ascorbic acid   500 mg Oral Daily   bisacodyl   10 mg Oral BID   calcium -vitamin D   1 tablet Oral Daily   cholecalciferol   1,000 Units Oral Daily   docusate sodium   100 mg Oral BID   [START ON 01/19/2025] ferrous sulfate   325 mg Oral Q M,W,F   metoprolol  succinate  37.5 mg Oral Daily   polyethylene glycol  17 g Oral BID   Continuous Infusions:  sodium chloride  75 mL/hr at 01/18/25 0202   cefTRIAXone  (ROCEPHIN )  IV 1 g (01/18/25 0204)    Nutritional status     Body mass index is 22.46 kg/m.  Data Reviewed:   CBC: Recent Labs  Lab 01/17/25 1542 01/17/25 1551 01/18/25 0508  WBC 6.8  --  5.6  NEUTROABS 5.0  --   --   HGB 12.5 12.9 12.2  HCT 39.6 38.0 39.0  MCV 102.9*  --  103.4*  PLT 294  --  236   Basic Metabolic Panel: Recent Labs  Lab 01/17/25 1542 01/17/25 1551 01/18/25 0508  NA 138 140 138  K 4.5 4.2 4.1  CL 104 103 105  CO2 24  --  23  GLUCOSE 89 89 77  BUN 23 24* 20  CREATININE 0.96 1.10* 0.73  CALCIUM  9.3  --  9.1  MG 2.0  --  2.0  PHOS  --   --  3.0   GFR: Estimated Creatinine Clearance: 28.2 mL/min (by C-G formula based on SCr of 0.73 mg/dL). Liver Function Tests: Recent Labs  Lab 01/17/25 1542 01/18/25 0508  AST 28 22  ALT <5 <5  ALKPHOS 210* 192*  BILITOT 1.2 1.0  PROT 6.4* 5.7*  ALBUMIN  3.5 3.2*   No results for input(s): LIPASE, AMYLASE in the last 168 hours. No results for input(s): AMMONIA in the last 168 hours. Coagulation Profile: No results for input(s): INR, PROTIME in the last 168 hours. Cardiac Enzymes: No results for input(s): CKTOTAL, CKMB, CKMBINDEX, TROPONINI in the last 168 hours. BNP (last 3 results) No results for input(s): PROBNP in the last 8760 hours. HbA1C: No results for input(s): HGBA1C in the last 72 hours. CBG: Recent Labs  Lab 01/17/25 1619  GLUCAP 84   Lipid Profile: No results for input(s): CHOL, HDL, LDLCALC, TRIG, CHOLHDL,  LDLDIRECT in the last 72 hours. Thyroid  Function Tests: No results for input(s): TSH, T4TOTAL, FREET4, T3FREE, THYROIDAB in the last 72 hours. Anemia Panel: No results for input(s): VITAMINB12, FOLATE, FERRITIN, TIBC, IRON, RETICCTPCT in the last 72 hours. Sepsis Labs: No results for input(s): PROCALCITON, LATICACIDVEN in the last 168 hours.  Recent Results (from the past 240 hours)  Resp panel by RT-PCR (RSV, Flu A&B, Covid) Urine, Clean Catch     Status: None   Collection Time: 01/17/25  2:42 PM   Specimen: Urine, Clean Catch; Nasal Swab  Result Value Ref Range Status   SARS Coronavirus 2 by  RT PCR NEGATIVE NEGATIVE Final   Influenza A by PCR NEGATIVE NEGATIVE Final   Influenza B by PCR NEGATIVE NEGATIVE Final    Comment: (NOTE) The Xpert Xpress SARS-CoV-2/FLU/RSV plus assay is intended as an aid in the diagnosis of influenza from Nasopharyngeal swab specimens and should not be used as a sole basis for treatment. Nasal washings and aspirates are unacceptable for Xpert Xpress SARS-CoV-2/FLU/RSV testing.  Fact Sheet for Patients: bloggercourse.com  Fact Sheet for Healthcare Providers: seriousbroker.it  This test is not yet approved or cleared by the [place]  FDA and has been authorized for detection and/or diagnosis of SARS-CoV-2 by FDA under an Emergency Use Authorization (EUA). This EUA will remain in effect (meaning this test can be used) for the duration of the COVID-19 declaration under Section 564(b)(1) of the Act, 21 U.S.C. section 360bbb-3(b)(1), unless the authorization is terminated or revoked.     Resp Syncytial Virus by PCR NEGATIVE NEGATIVE Final    Comment: (NOTE) Fact Sheet for Patients: bloggercourse.com  Fact Sheet for Healthcare Providers: seriousbroker.it  This test is not yet approved or cleared by the [place]  FDA  and has been authorized for detection and/or diagnosis of SARS-CoV-2 by FDA under an Emergency Use Authorization (EUA). This EUA will remain in effect (meaning this test can be used) for the duration of the COVID-19 declaration under Section 564(b)(1) of the Act, 21 U.S.C. section 360bbb-3(b)(1), unless the authorization is terminated or revoked.  Performed at [facility], [address] [place], [place] [address]          Radiology Studies: DG Abd 1 View Result Date: 01/17/2025 EXAM: 1 VIEW XRAY OF THE ABDOMEN 01/17/2025 07:43:00 PM COMPARISON: None available. CLINICAL HISTORY: Constipation. FINDINGS: LINES, TUBES AND DEVICES: Foley balloon catheter in urinary bladder. BOWEL: Nonobstructive bowel gas pattern. Moderate colonic stool burden. SOFT TISSUES: No abnormal calcifications. BONES: L4-L5 PLIF surgical hardware in place. Partially imaged left femur ORIF noted. No acute fracture. DISCS/DEGENERATIVE CHANGES: Severe lumbar spine degeneration with levoscoliosis. IMPRESSION: 1. Moderate colonic stool burden. No evidence of obstruction. Electronically signed by: Norman Gatlin MD 01/17/2025 07:52 PM EST RP Workstation: [id number]   CT ANGIO HEAD NECK W WO CM Result Date: 01/17/2025 EXAM: CT HEAD WITHOUT CTA HEAD AND NECK WITH AND WITHOUT 01/17/2025 05:01:46 PM TECHNIQUE: CTA of the head and neck was performed with and without the administration of 75 mL of iohexol  (OMNIPAQUE ) 350 MG/ML injection. Noncontrast CT of the head with reconstructed 2-D images are also provided for review. Multiplanar 2D and/or 3D reformatted images are provided for review. Automated exposure control, iterative reconstruction, and/or weight based adjustment of the mA/kV was utilized to reduce the radiation dose to as low as reasonably achievable. COMPARISON: Head CT 12/29/2024 and MRI 09/04/2023 CLINICAL HISTORY: Altered mental status. History of dementia. FINDINGS: The examination is mildly motion degraded. CT  HEAD: BRAIN AND VENTRICLES: There is no evidence of an acute infarct, intracranial hemorrhage, mass, midline shift, hydrocephalus, or extra-axial fluid collection. There is mild cerebral atrophy. Patchy to confluent hypodensities in the cerebral white matter bilaterally are similar to the prior CT and nonspecific but compatible with moderate chronic small vessel ischemic disease. Calcified atherosclerosis at the skull base. ORBITS: Bilateral cataract extraction. No acute abnormality. SINUSES AND MASTOIDS: No acute abnormality. CTA NECK: AORTIC ARCH AND ARCH VESSELS: Moderate, predominantly calcified plaque in the aortic arch. No dissection or arterial injury. No significant stenosis of the brachiocephalic or subclavian arteries. CERVICAL CAROTID ARTERIES: No dissection, arterial injury,  or hemodynamically significant stenosis by NASCET criteria. CERVICAL VERTEBRAL ARTERIES: Dominant right vertebral artery. No dissection, arterial injury, or significant stenosis. LUNGS AND MEDIASTINUM: Chest reported separately. SOFT TISSUES: 1 cm right thyroid  nodule for which no follow up imaging is recommended. No acute abnormality. BONES: Advanced disc degeneration in the mid to lower cervical spine. CTA HEAD: ANTERIOR CIRCULATION: The intracranial internal carotid arteries are widely patent. ACAs and MCAs are patent without evidence of a proximal branch occlusion or significant proximal stenosis. No aneurysm. POSTERIOR CIRCULATION: The intracranial vertebral arteries are widely patent to the basilar. Patent left PICA, right AICA, and bilateral SCA origins are visualized. The basilar artery is widely patent. There are small right and diminutive or absent left posterior communicating arteries. Both PCAs are patent without evidence of a significant proximal stenosis. No aneurysm. OTHER: No dural venous sinus thrombosis on this non-dedicated study. IMPRESSION: 1. No acute intracranial abnormality. Moderate chronic small vessel  ischemic disease. 2. Mild atherosclerosis without a large vessel occlusion or significant proximal stenosis in the head or neck. 3. Aortic Atherosclerosis (ICD10-I70.0). 4. Cerebral Atrophy (ICD10-G31.9). Electronically signed by: Dasie Hamburg MD 01/17/2025 05:18 PM EST RP Workstation: [id number]   CT Angio Chest PE W and/or Wo Contrast Result Date: 01/17/2025 EXAM: CTA CHEST 01/17/2025 05:01:46 PM TECHNIQUE: CTA of the chest was performed after the administration of 75 mL of iohexol  (OMNIPAQUE ) 350 MG/ML intravenous contrast. Multiplanar reformatted images are provided for review. MIP images are provided for review. Automated exposure control, iterative reconstruction, and/or weight based adjustment of the mA/kV was utilized to reduce the radiation dose to as low as reasonably achievable. COMPARISON: 12/29/2024 CLINICAL HISTORY: Pulmonary embolism (PE) suspected, high probability. FINDINGS: PULMONARY ARTERIES: Pulmonary arteries are adequately opacified for evaluation. No acute pulmonary embolus. The main pulmonary artery is dilated, suggesting underlying pulmonary arterial hypertension. MEDIASTINUM: The heart and pericardium demonstrate no acute abnormality. Scattered calcified atherosclerosis throughout the aorta. There is no acute abnormality of the thoracic aorta. LYMPH NODES: Likely intrafissural lymph node associated with the horizontal fissure. No mediastinal, hilar or axillary lymphadenopathy. LUNGS AND PLEURA: Apical pleural parenchymal scarring. Posterior bibasilar dependent atelectasis. Interval resolution of the small bilateral pleural effusions. No pneumothorax. UPPER ABDOMEN: Limited images of the upper abdomen are unremarkable. SOFT TISSUES AND BONES: Redemonstrated intramuscular lipoma within the left shoulder musculature. Diffuse osteopenia. Redemonstrated comminuted fracture of the proximal left humerus. Multilevel degenerative disc disease of the spine. Levocurvature at the thoracolumbar  junction. Diffuse osteopenia. IMPRESSION: 1. No acute intrathoracic abnormality. No pulmonary embolism, pulmonary edema , or pneumonia. 2. Resolution of the small bilateral pleural effusions. Electronically signed by: Rogelia Myers MD 01/17/2025 05:14 PM EST RP Workstation: [id number]   DG Chest Portable 1 View Result Date: 01/17/2025 CLINICAL DATA:  Altered mental status EXAM: PORTABLE CHEST 1 VIEW COMPARISON:  12/29/2024 FINDINGS: Proximal left humerus fracture has been detailed previously. Numerous leads and wires project over the chest. Patient rotated right. Moderate cardiomegaly. Atherosclerosis in the transverse aorta. No pleural effusion or pneumothorax. No congestive failure. No lobar consolidation. Mild left base scarring or subsegmental atelectasis medially. IMPRESSION: Cardiomegaly, without congestive failure or acute disease. Aortic Atherosclerosis (ICD10-I70.0). Electronically Signed   By: Rockey Kilts M.D.   On: 01/17/2025 15:07           LOS: 0 days   Time spent= 35 mins    Burgess JAYSON Dare, MD [facility]  If 7PM-7AM, please contact night-coverage  01/18/2025, 10:03 AM  "

## 2025-01-19 ENCOUNTER — Other Ambulatory Visit (HOSPITAL_COMMUNITY): Payer: Self-pay

## 2025-01-19 DIAGNOSIS — G9340 Encephalopathy, unspecified: Secondary | ICD-10-CM | POA: Diagnosis not present

## 2025-01-19 LAB — BASIC METABOLIC PANEL WITH GFR
Anion gap: 8 (ref 5–15)
BUN: 20 mg/dL (ref 8–23)
CO2: 24 mmol/L (ref 22–32)
Calcium: 8.9 mg/dL (ref 8.9–10.3)
Chloride: 107 mmol/L (ref 98–111)
Creatinine, Ser: 0.82 mg/dL (ref 0.44–1.00)
GFR, Estimated: >60 mL/min
Glucose, Bld: 83 mg/dL (ref 70–99)
Potassium: 4.4 mmol/L (ref 3.5–5.1)
Sodium: 138 mmol/L (ref 135–145)

## 2025-01-19 LAB — CBC
HCT: 35.3 % — ABNORMAL LOW (ref 36.0–46.0)
Hemoglobin: 11.2 g/dL — ABNORMAL LOW (ref 12.0–15.0)
MCH: 32.7 pg (ref 26.0–34.0)
MCHC: 31.7 g/dL (ref 30.0–36.0)
MCV: 103.2 fL — ABNORMAL HIGH (ref 80.0–100.0)
Platelets: 245 10*3/uL (ref 150–400)
RBC: 3.42 MIL/uL — ABNORMAL LOW (ref 3.87–5.11)
RDW: 15.3 % (ref 11.5–15.5)
WBC: 4.8 10*3/uL (ref 4.0–10.5)
nRBC: 0 % (ref 0.0–0.2)

## 2025-01-19 LAB — PHOSPHORUS: Phosphorus: 3.1 mg/dL (ref 2.5–4.6)

## 2025-01-19 LAB — MAGNESIUM: Magnesium: 2 mg/dL (ref 1.7–2.4)

## 2025-01-19 MED ORDER — ACETAMINOPHEN 500 MG PO TABS: 1000.0000 mg | ORAL_TABLET | Freq: Three times a day (TID) | ORAL | Status: AC

## 2025-01-19 MED ORDER — CEPHALEXIN 500 MG PO CAPS
500.0000 mg | ORAL_CAPSULE | Freq: Three times a day (TID) | ORAL | 0 refills | Status: AC
  Filled 2025-01-19: qty 21, 7d supply, fill #0

## 2025-01-19 MED ORDER — DOCUSATE SODIUM 100 MG PO CAPS: 100.0000 mg | ORAL_CAPSULE | Freq: Two times a day (BID) | ORAL | 0 refills | Status: AC

## 2025-01-19 MED ORDER — ALPRAZOLAM 0.25 MG PO TABS
0.2500 mg | ORAL_TABLET | Freq: Two times a day (BID) | ORAL | 0 refills | Status: AC | PRN
  Filled 2025-01-19: qty 20, 10d supply, fill #0

## 2025-01-19 MED ORDER — BISACODYL 5 MG PO TBEC
10.0000 mg | DELAYED_RELEASE_TABLET | Freq: Every day | ORAL | 0 refills | Status: AC | PRN
  Filled 2025-01-19: qty 30, 15d supply, fill #0

## 2025-01-19 MED ORDER — LEVOTHYROXINE SODIUM 50 MCG PO TABS
50.0000 ug | ORAL_TABLET | Freq: Every day | ORAL | 0 refills | Status: AC
  Filled 2025-01-19: qty 90, 90d supply, fill #0

## 2025-01-19 NOTE — Plan of Care (Signed)

## 2025-01-19 NOTE — TOC Transition Note (Signed)
 Transition of Care [facility]) - Discharge Note   Patient Details  Name: Jessica Berg MRN: [id number] Date of Birth: October 14, 1924  Transition of [facility] CM/SW Contact:  Rosaline JONELLE Joe, RN Phone Number: 01/19/2025, 11:52 AM   Clinical Narrative:    CM spoke with Magdalena Berber, CM with [facility] and DME has delivered to the home.  Patient is reach to discharge home with home hospice.  Discharge medications were delivered to the unit through [facility].  [facility] was called for transport to home.  DNR and medical necessity are present in the PTAR packet.         Patient Goals and CMS Choice            Discharge Placement                       Discharge Plan and Services Additional resources added to the After Visit Summary for                                       Social Drivers of Health (SDOH) Interventions SDOH Screenings   Food Insecurity: No Food Insecurity (01/17/2025)  Housing: Low Risk (01/17/2025)  Transportation Needs: No Transportation Needs (01/17/2025)  Utilities: Not At Risk (01/17/2025)  Social Connections: Unknown (01/17/2025)  Tobacco Use: Low Risk (01/18/2025)     Readmission Risk Interventions    01/19/2025   11:52 AM  Readmission Risk Prevention Plan  Post Dischage Appt Complete  Medication Screening Complete  Transportation Screening Complete

## 2025-01-19 NOTE — Discharge Summary (Signed)
 Physician Discharge Summary  Jessica Berg FMW:[id number] DOB: 06/12/1924 DOA: 01/17/2025  PCP: Charlott Dorn LABOR, MD  Admit date: 01/17/2025 Discharge date: 01/19/2025  Admitted From: Home Disposition: Home with hospice  Recommendations for Outpatient Follow-up:  Follow up with PCP in 1-2 weeks Please obtain BMP/CBC in one week your next doctors visit.  Xanax  as needed for anxiety anxiety For now Keflex  for 7 days.  Also added Synthroid  to be taken daily. Family aware to discontinue all or any medications if patient is unable to tolerated   Discharge Condition: Stable CODE STATUS: DNR Diet recommendation: Regular comfort feed  Brief/Interim Summary: Brief Narrative:   89 year old with history of A-fib on Eliquis , GERD, HLD, recent left hip fracture, CKD stage II, left proximal humerus fracture, GERD, urinary retention, constipation, dementia presented with acute encephalopathy from SNF.  Patient was recently treated for left hip and humerus fracture along with a UTI.  Due to urinary retention, Foley catheter was placed.  Over the weekend due to poor weather, unable to be seen by family but eventually was noted to be encephalopathic therefore brought to the hospital.  CT of the head unremarkable, CTA head and neck does not show any LVO.  KUB showed moderate stool burden.  CTA chest was negative for PE. Eventually family decided to transition patient home with hospice therefore arrangements made.  Will discharge her today  Assessment & Plan:  Acute encephalopathy Catheter associated urinary tract infection, PTA -Multifactorial in nature with combination of dehydration, polypharmacy, recent urinary tract infection - Currently ongoing gentle hydration.  CT head and CTA head and neck are unremarkable.  Currently holding any sedative medications. -TSH, ammonia, B12, folate - We can continue Foley catheter for comfort upon discharge.  Transition to p.o. Keflex  for 7 more  days  Moderate constipation - Bowel regimen  Essential hypertension - On metoprolol   Atrial fibrillation, paroxysmal - On Eliquis   CKD stage II - Creatinine around baseline 1.0  Recent fall with left femur fracture Left proximal humerus fracture - Status post ORIF of the left femur on 1/14.  Routine postop recovery.  Already on Eliquis . -Nonoperative left humerus, to utilize sling  History of dementia  Urinary retention - Foley catheter placed during recent admission  GERD - PPI  Transition patient home with hospice.  They do agree that slowly if she is not able to tolerate medications we will discontinue them  PT Follow up Recs:   Subjective: Seen at bedside, both her daughters are also present at bedside.  They all agree that patient should really be comfortable.  Will transition her home with hospice later today  Examination:  General exam: Appears calm and comfortable  Respiratory system: Clear to auscultation. Respiratory effort normal. Cardiovascular system: S1 & S2 heard, RRR. No JVD, murmurs, rubs, gallops or clicks. No pedal edema. Gastrointestinal system: Abdomen is nondistended, soft and nontender. No organomegaly or masses felt. Normal bowel sounds heard. Central nervous system: Alert and oriented. No focal neurological deficits. Extremities: Symmetric 5 x 5 power. Skin: No rashes, lesions or ulcers Psychiatry: Judgement and insight appear normal. Mood & affect appropriate.    Discharge Diagnoses:  Principal Problem:   Acute encephalopathy Active Problems:   A-fib (HCC)   Essential hypertension   Stage 3b chronic kidney disease (CKD) (HCC)   UTI (urinary tract infection)   Dementia without behavioral disturbance (HCC)   Dehydration      Discharge Exam: Vitals:   01/19/25 0013 01/19/25 0408  BP: 111/65 118/64  Pulse: 75 75  Resp: 17 17  Temp: 97.6 F (36.4 C) 98 F (36.7 C)  SpO2: 97% 100%   Vitals:   01/18/25 1637 01/18/25 2010  01/19/25 0013 01/19/25 0408  BP: 136/73 125/81 111/65 118/64  Pulse: 81 76 75 75  Resp:  17 17 17   Temp:  97.6 F (36.4 C) 97.6 F (36.4 C) 98 F (36.7 C)  TempSrc:      SpO2: 100% 100% 97% 100%  Weight:      Height:          Discharge Instructions   Allergies as of 01/19/2025       Reactions   Alendronate Other (See Comments)    Unknown   Calcitonin Nausea And Vomiting   Gabapentin Other (See Comments)   Blurred vision   Hydrocodone -acetaminophen  Other (See Comments)   Dizziness    Latex Other (See Comments)    Unknown   Meperidine  Hcl Nausea And Vomiting   Oxycodone -acetaminophen  Other (See Comments)   Unknown   Pravastatin Other (See Comments)   Unknown   Raloxifene Other (See Comments)    Unknown   Risedronate Other (See Comments)    Unknown   Tramadol  Hcl Other (See Comments)   Does not feel right   Darvocet [propoxyphene N-acetaminophen ] Nausea And Vomiting   Demerol  Hives   Etodolac Hives, Rash   Unknown to patient   Hydrocodone  Itching   Meprozine [meperidine -promethazine] Other (See Comments)   Reaction=tongue swelling   Penicillins Rash   Percocet [oxycodone -acetaminophen ] Nausea And Vomiting   Dizziness    Ultracet [tramadol -acetaminophen ] Other (See Comments)   headache        Medication List     STOP taking these medications    ascorbic acid  500 MG tablet Commonly known as: VITAMIN C    Calcium  Carbonate-Vitamin D3 600-400 MG-UNIT Tabs   cholecalciferol  1000 units tablet Commonly known as: VITAMIN D    levofloxacin 250 MG tablet Commonly known as: LEVAQUIN   Nutritional Supplement Liqd   oxyCODONE  5 MG immediate release tablet Commonly known as: Oxy IR/ROXICODONE        TAKE these medications    acetaminophen  500 MG tablet Commonly known as: TYLENOL  Take 2 tablets (1,000 mg total) by mouth 3 (three) times daily. What changed:  medication strength how much to take when to take this reasons to take this   ALPRAZolam   0.25 MG tablet Commonly known as: Xanax  Take 1 tablet (0.25 mg total) by mouth 2 (two) times daily as needed for anxiety.   apixaban  2.5 MG Tabs tablet Commonly known as: Eliquis  Take 1 tablet (2.5 mg total) by mouth 2 (two) times daily.   bisacodyl  5 MG EC tablet Commonly known as: DULCOLAX Take 2 tablets (10 mg total) by mouth daily as needed for moderate constipation.   celecoxib  200 MG capsule Commonly known as: CELEBREX  Take 1 capsule (200 mg total) by mouth 2 (two) times daily.   cephALEXin  500 MG capsule Commonly known as: KEFLEX  Take 1 capsule (500 mg total) by mouth 3 (three) times daily for 7 days.   docusate sodium  100 MG capsule Commonly known as: COLACE Take 1 capsule (100 mg total) by mouth 2 (two) times daily.   ferrous sulfate  325 (65 FE) MG tablet Take 325 mg by mouth every Monday, Wednesday, and Friday.   levothyroxine  50 MCG tablet Commonly known as: SYNTHROID  Take 1 tablet (50 mcg total) by mouth daily at 6 (six) AM. Start taking on: January 20, 2025   metoprolol   succinate 25 MG 24 hr tablet Commonly known as: TOPROL -XL Take 1.5 tablets (37.5 mg total) by mouth at bedtime.   polyethylene glycol 17 g packet Commonly known as: MIRALAX  / GLYCOLAX  Take 17 g by mouth 2 (two) times daily.   saccharomyces boulardii 250 MG capsule Commonly known as: FLORASTOR Take 250 mg by mouth 2 (two) times daily. For 7 days   senna 8.6 MG Tabs tablet Commonly known as: SENOKOT Take 1 tablet (8.6 mg total) by mouth daily.        Follow-up Information     Charlott Dorn LABOR, MD Follow up in 1 week(s).   Specialty: Internal Medicine Contact information: [address] [place] [address] [phone]         [facility] Follow up.   Specialty: PALLIATIVE CARE Why: [facility] will provide home hospice care.  They will start hospice care when patient arrives home in the next 24 hours. Contact information: [address] [place]  [id number] [phone]               Allergies[1]  You were cared for by a hospitalist during your hospital stay. If you have any questions about your discharge medications or the care you received while you were in the hospital after you are discharged, you can call the unit and asked to speak with the hospitalist on call if the hospitalist that took care of you is not available. Once you are discharged, your primary care physician will handle any further medical issues. Please note that no refills for any discharge medications will be authorized once you are discharged, as it is imperative that you return to your primary care physician (or establish a relationship with a primary care physician if you do not have one) for your aftercare needs so that they can reassess your need for medications and monitor your lab values.  You were cared for by a hospitalist during your hospital stay. If you have any questions about your discharge medications or the care you received while you were in the hospital after you are discharged, you can call the unit and asked to speak with the hospitalist on call if the hospitalist that took care of you is not available. Once you are discharged, your primary care physician will handle any further medical issues. Please note that NO REFILLS for any discharge medications will be authorized once you are discharged, as it is imperative that you return to your primary care physician (or establish a relationship with a primary care physician if you do not have one) for your aftercare needs so that they can reassess your need for medications and monitor your lab values.  Please request your Prim.MD to go over all Hospital Tests and Procedure/Radiological results at the follow up, please get all Hospital records sent to your Prim MD by signing hospital release before you go home.  Get CBC, CMP, 2 view Chest X ray checked  by Primary  MD during your next visit or SNF MD in 5-7 days ( we routinely change or add medications that can affect your baseline labs and fluid status, therefore we recommend that you get the mentioned basic workup next visit with your PCP, your PCP may decide not to get them or add new tests based on their clinical decision)  On your next visit with your primary care physician please Get Medicines reviewed and adjusted.  If you experience worsening of your admission symptoms,  develop shortness of breath, life threatening emergency, suicidal or homicidal thoughts you must seek medical attention immediately by calling 911 or calling your MD immediately  if symptoms less severe.  You Must read complete instructions/literature along with all the possible adverse reactions/side effects for all the Medicines you take and that have been prescribed to you. Take any new Medicines after you have completely understood and accpet all the possible adverse reactions/side effects.   Do not drive, operate heavy machinery, perform activities at heights, swimming or participation in water activities or provide baby sitting services if your were admitted for syncope or siezures until you have seen by Primary MD or a Neurologist and advised to do so again.  Do not drive when taking Pain medications.   Procedures/Studies: DG Abd 1 View Result Date: 01/17/2025 EXAM: 1 VIEW XRAY OF THE ABDOMEN 01/17/2025 07:43:00 PM COMPARISON: None available. CLINICAL HISTORY: Constipation. FINDINGS: LINES, TUBES AND DEVICES: Foley balloon catheter in urinary bladder. BOWEL: Nonobstructive bowel gas pattern. Moderate colonic stool burden. SOFT TISSUES: No abnormal calcifications. BONES: L4-L5 PLIF surgical hardware in place. Partially imaged left femur ORIF noted. No acute fracture. DISCS/DEGENERATIVE CHANGES: Severe lumbar spine degeneration with levoscoliosis. IMPRESSION: 1. Moderate colonic stool burden. No evidence of obstruction. Electronically  signed by: Norman Gatlin MD 01/17/2025 07:52 PM EST RP Workstation: [id number]   CT ANGIO HEAD NECK W WO CM Result Date: 01/17/2025 EXAM: CT HEAD WITHOUT CTA HEAD AND NECK WITH AND WITHOUT 01/17/2025 05:01:46 PM TECHNIQUE: CTA of the head and neck was performed with and without the administration of 75 mL of iohexol  (OMNIPAQUE ) 350 MG/ML injection. Noncontrast CT of the head with reconstructed 2-D images are also provided for review. Multiplanar 2D and/or 3D reformatted images are provided for review. Automated exposure control, iterative reconstruction, and/or weight based adjustment of the mA/kV was utilized to reduce the radiation dose to as low as reasonably achievable. COMPARISON: Head CT 12/29/2024 and MRI 09/04/2023 CLINICAL HISTORY: Altered mental status. History of dementia. FINDINGS: The examination is mildly motion degraded. CT HEAD: BRAIN AND VENTRICLES: There is no evidence of an acute infarct, intracranial hemorrhage, mass, midline shift, hydrocephalus, or extra-axial fluid collection. There is mild cerebral atrophy. Patchy to confluent hypodensities in the cerebral white matter bilaterally are similar to the prior CT and nonspecific but compatible with moderate chronic small vessel ischemic disease. Calcified atherosclerosis at the skull base. ORBITS: Bilateral cataract extraction. No acute abnormality. SINUSES AND MASTOIDS: No acute abnormality. CTA NECK: AORTIC ARCH AND ARCH VESSELS: Moderate, predominantly calcified plaque in the aortic arch. No dissection or arterial injury. No significant stenosis of the brachiocephalic or subclavian arteries. CERVICAL CAROTID ARTERIES: No dissection, arterial injury, or hemodynamically significant stenosis by NASCET criteria. CERVICAL VERTEBRAL ARTERIES: Dominant right vertebral artery. No dissection, arterial injury, or significant stenosis. LUNGS AND MEDIASTINUM: Chest reported separately. SOFT TISSUES: 1 cm right thyroid  nodule for which no follow up  imaging is recommended. No acute abnormality. BONES: Advanced disc degeneration in the mid to lower cervical spine. CTA HEAD: ANTERIOR CIRCULATION: The intracranial internal carotid arteries are widely patent. ACAs and MCAs are patent without evidence of a proximal branch occlusion or significant proximal stenosis. No aneurysm. POSTERIOR CIRCULATION: The intracranial vertebral arteries are widely patent to the basilar. Patent left PICA, right AICA, and bilateral SCA origins are visualized. The basilar artery is widely patent. There are small right and diminutive or absent left posterior communicating arteries. Both PCAs are patent without evidence of a significant proximal stenosis. No aneurysm. OTHER:  No dural venous sinus thrombosis on this non-dedicated study. IMPRESSION: 1. No acute intracranial abnormality. Moderate chronic small vessel ischemic disease. 2. Mild atherosclerosis without a large vessel occlusion or significant proximal stenosis in the head or neck. 3. Aortic Atherosclerosis (ICD10-I70.0). 4. Cerebral Atrophy (ICD10-G31.9). Electronically signed by: Dasie Hamburg MD 01/17/2025 05:18 PM EST RP Workstation: [id number]   CT Angio Chest PE W and/or Wo Contrast Result Date: 01/17/2025 EXAM: CTA CHEST 01/17/2025 05:01:46 PM TECHNIQUE: CTA of the chest was performed after the administration of 75 mL of iohexol  (OMNIPAQUE ) 350 MG/ML intravenous contrast. Multiplanar reformatted images are provided for review. MIP images are provided for review. Automated exposure control, iterative reconstruction, and/or weight based adjustment of the mA/kV was utilized to reduce the radiation dose to as low as reasonably achievable. COMPARISON: 12/29/2024 CLINICAL HISTORY: Pulmonary embolism (PE) suspected, high probability. FINDINGS: PULMONARY ARTERIES: Pulmonary arteries are adequately opacified for evaluation. No acute pulmonary embolus. The main pulmonary artery is dilated, suggesting underlying pulmonary arterial  hypertension. MEDIASTINUM: The heart and pericardium demonstrate no acute abnormality. Scattered calcified atherosclerosis throughout the aorta. There is no acute abnormality of the thoracic aorta. LYMPH NODES: Likely intrafissural lymph node associated with the horizontal fissure. No mediastinal, hilar or axillary lymphadenopathy. LUNGS AND PLEURA: Apical pleural parenchymal scarring. Posterior bibasilar dependent atelectasis. Interval resolution of the small bilateral pleural effusions. No pneumothorax. UPPER ABDOMEN: Limited images of the upper abdomen are unremarkable. SOFT TISSUES AND BONES: Redemonstrated intramuscular lipoma within the left shoulder musculature. Diffuse osteopenia. Redemonstrated comminuted fracture of the proximal left humerus. Multilevel degenerative disc disease of the spine. Levocurvature at the thoracolumbar junction. Diffuse osteopenia. IMPRESSION: 1. No acute intrathoracic abnormality. No pulmonary embolism, pulmonary edema , or pneumonia. 2. Resolution of the small bilateral pleural effusions. Electronically signed by: Rogelia Myers MD 01/17/2025 05:14 PM EST RP Workstation: [id number]   DG Chest Portable 1 View Result Date: 01/17/2025 CLINICAL DATA:  Altered mental status EXAM: PORTABLE CHEST 1 VIEW COMPARISON:  12/29/2024 FINDINGS: Proximal left humerus fracture has been detailed previously. Numerous leads and wires project over the chest. Patient rotated right. Moderate cardiomegaly. Atherosclerosis in the transverse aorta. No pleural effusion or pneumothorax. No congestive failure. No lobar consolidation. Mild left base scarring or subsegmental atelectasis medially. IMPRESSION: Cardiomegaly, without congestive failure or acute disease. Aortic Atherosclerosis (ICD10-I70.0). Electronically Signed   By: Rockey Kilts M.D.   On: 01/17/2025 15:07   DG HIP PORT UNILAT W OR W/O PELVIS 1V LEFT Result Date: 12/30/2024 EXAM: 1 VIEW(S) XRAY OF THE LEFT HIP 12/30/2024 11:25:00 AM  COMPARISON: None available. CLINICAL HISTORY: [id number] Fracture [id number] Fracture [id number] Fracture [phone] FINDINGS: BONES AND JOINTS: Status post intramedullary nail and dynamic hip screw fixation of an intertrochanteric fracture of the left femur. Displaced lesser trochanter fracture fragment noted. SOFT TISSUES: Subcutaneous emphysema overlying the left hip consistent with postsurgical changes. IMPRESSION: 1. Status post intramedullary nail and dynamic hip screw fixation of an intertrochanteric fracture of the left femur with displaced lesser trochanter fracture fragment. 2. Subcutaneous emphysema overlying the left hip, consistent with postsurgical changes. Electronically signed by: Waddell Calk MD 12/30/2024 03:28 PM EST RP Workstation: [id number]   DG HIP UNILAT WITH PELVIS 2-3 VIEWS LEFT Result Date: 12/29/2024 CLINICAL DATA:  Surgical internal fixation proximal left femoral fracture. EXAM: DG HIP (WITH OR WITHOUT PELVIS) 2-3V LEFT; DG C-ARM 1-60 MIN-NO REPORT Radiation exposure index: 4.29 mGy COMPARISON:  Same day FINDINGS: Six intraoperative fluoroscopic images were obtained of the left hip. These demonstrate surgical internal fixation  of proximal left femoral fracture. IMPRESSION: Fluoroscopic guidance provided during surgical internal fixation of proximal left femoral fracture. Electronically Signed   By: Lynwood Landy Raddle M.D.   On: 12/29/2024 15:43   DG C-Arm 1-60 Min-No Report Result Date: 12/29/2024 Fluoroscopy was utilized by the requesting physician.  No radiographic interpretation.   CT CHEST ABDOMEN PELVIS W CONTRAST Result Date: 12/29/2024 EXAM: CT CHEST, ABDOMEN AND PELVIS WITH CONTRAST 12/29/2024 09:18:18 AM TECHNIQUE: CT of the chest, abdomen and pelvis was performed with the administration of intravenous contrast. Multiplanar reformatted images are provided for review. Automated exposure control, iterative reconstruction, and/or weight based adjustment of the mA/kV was utilized to reduce  the radiation dose to as low as reasonably achievable. COMPARISON: CT of the abdomen and pelvis dated 12/27/2015. CLINICAL HISTORY: Polytrauma, blunt. The patient sustained polytrauma due to blunt force. FINDINGS: CHEST: MEDIASTINUM AND LYMPH NODES: Heart is moderately enlarged and there is mild calcific coronary artery disease present. The main pulmonary artery is dilated, measuring approximately 4 cm in diameter. The ascending thoracic aorta is also mildly prominent, measuring 3.5 cm in diameter. The descending thoracic aorta is tortuous and demonstrates mild-to-moderate calcific atheromatous disease. The great arteries are widely patent. The central airways are clear. No mediastinal, hilar or axillary lymphadenopathy. LUNGS AND PLEURA: There is mild dependent atelectasis present bilaterally. There are also small bilateral pleural effusions present. No focal consolidation or pulmonary edema. No pneumothorax. ABDOMEN AND PELVIS: LIVER: Unremarkable. GALLBLADDER AND BILE DUCTS: Unremarkable. No biliary ductal dilatation. SPLEEN: No acute abnormality. PANCREAS: No acute abnormality. ADRENAL GLANDS: No acute abnormality. KIDNEYS, URETERS AND BLADDER: There are simple renal cysts present bilaterally. Per consensus, no follow-up is needed for simple Bosniak type 1 and 2 renal cysts, unless the patient has a malignancy history or risk factors. No stones in the kidneys or ureters. No hydronephrosis. No perinephric or periureteral stranding. Urinary bladder is unremarkable. GI AND BOWEL: Stomach demonstrates no acute abnormality. There is a right inguinal hernia, which contains a short segment of unobstructed distal small bowel. There is no bowel obstruction. REPRODUCTIVE ORGANS: The patient is status post hysterectomy and bilateral salpingo-oophorectomy. PERITONEUM AND RETROPERITONEUM: No ascites. No free air. VASCULATURE: Aorta is normal in caliber. ABDOMINAL AND PELVIS LYMPH NODES: No lymphadenopathy. BONES AND SOFT  TISSUES: There is a mildly displaced and angulated fracture of the left humeral head and neck. There is sigmoid scoliosis of the thoracolumbar spine, with the lumbar curvature convex to the left. There is a comminuted left intertrochanteric fracture with mild varus angulation. The patient is status post interbody fusion and bilateral posterolateral spinal fixation at L4-L5. There is multilevel chronic degenerative disc disease present within the thoracolumbar spine. No focal soft tissue abnormality. IMPRESSION: 1. Mildly displaced and angulated fracture of the left humeral head and neck. 2. Comminuted left intertrochanteric fracture with mild varus angulation. 3. Small bilateral pleural effusions with mild dependent atelectasis. 4. Dilated main pulmonary artery (approximately 4.0 cm), which can be seen with pulmonary arterial hypertension; moderate cardiomegaly. 5. Right inguinal hernia containing a short segment of unobstructed distal small bowel. Electronically signed by: Evalene Coho MD 12/29/2024 09:54 AM EST RP Workstation: [id number]   CT CERVICAL SPINE WO CONTRAST Result Date: 12/29/2024 EXAM: CT CERVICAL SPINE WITHOUT CONTRAST 12/29/2024 09:18:18 AM TECHNIQUE: CT of the cervical spine was performed without the administration of intravenous contrast. Multiplanar reformatted images are provided for review. Automated exposure control, iterative reconstruction, and/or weight based adjustment of the mA/kV was utilized to reduce the radiation dose  to as low as reasonably achievable. COMPARISON: CT of the cervical spine dated 09/03/2022. CLINICAL HISTORY: Neck trauma (Age >= 65y). Neck trauma in a patient aged 31 years or older. FINDINGS: BONES AND ALIGNMENT: No acute fracture or traumatic malalignment. DEGENERATIVE CHANGES: Slight degenerative anterolisthesis at C3-C4 and C4-C5. Disc space narrowing and mild posterior endplate ridging at C5-C6 and C6-C7. Diffuse bilateral facet arthrosis present, most  pronounced on the left at C2-C3 and C3-C4. SOFT TISSUES: No prevertebral soft tissue swelling. LUNGS: Mild left pleural effusion. Mild biapical pleural disease. IMPRESSION: 1. No evidence of acute traumatic injury. 2. Multilevel cervical spondylosis with degenerative listhesis at C3-4 and C4-5; disc space narrowing and mild posterior endplate ridging at C5-6 and C6-7; diffuse facet arthrosis most pronounced on the left at C2-3 and C3-4. 3. Mild left pleural effusion and mild biapical pleural disease. Electronically signed by: Evalene Coho MD 12/29/2024 09:40 AM EST RP Workstation: [id number]   CT HEAD WO CONTRAST Result Date: 12/29/2024 EXAM: CT HEAD WITHOUT CONTRAST 12/29/2024 09:18:18 AM TECHNIQUE: CT of the head was performed without the administration of intravenous contrast. Automated exposure control, iterative reconstruction, and/or weight based adjustment of the mA/kV was utilized to reduce the radiation dose to as low as reasonably achievable. COMPARISON: None available. CLINICAL HISTORY: Minor head trauma in a patient aged 51 years or older. FINDINGS: BRAIN AND VENTRICLES: No acute hemorrhage. No evidence of acute infarct. Proportional prominence of ventricles and sulci, consistent with diffuse cerebral parenchymal volume loss. Periventricular and subcortical white matter hypoattenuation, consistent with moderate chronic ischemic microvascular disease. No hydrocephalus. No extra-axial collection. No mass effect or midline shift. ORBITS: Bilateral lens replacement noted. Calcified atherosclerotic plaque within cavernous/supraclinoid internal carotid arteries. SINUSES: No acute abnormality. SOFT TISSUES AND SKULL: Left parietal scalp soft tissue swelling. No skull fracture. IMPRESSION: 1. No acute intracranial abnormality related to minor head trauma. 2. Left parietal scalp soft tissue swelling. 3. Diffuse cerebral parenchymal volume loss and moderate chronic ischemic microvascular disease. 4.  Calcified atherosclerotic plaque within the cavernous/supraclinoid internal carotid arteries. Electronically signed by: Evalene Coho MD 12/29/2024 09:35 AM EST RP Workstation: [id number]   DG Hip Unilat With Pelvis 2-3 Views Left Result Date: 12/29/2024 CLINICAL DATA:  LEFT shoulder and hip pain status post fall EXAM: DG HIP (WITH OR WITHOUT PELVIS) 2-3V LEFT COMPARISON:  None available FINDINGS: Acute, mildly angulated, moderately displaced LEFT intertrochanteric femur fracture. Lumbar fusion hardware partially visualized. Mild diffuse osteopenia. Mild degenerative changes of the RIGHT hip. IMPRESSION: Acute, mildly angulated, moderately displaced LEFT intertrochanteric femur fracture. Electronically Signed   By: Aliene Lloyd M.D.   On: 12/29/2024 08:32   DG Knee 1-2 Views Left Result Date: 12/29/2024 CLINICAL DATA:  LEFT shoulder and hip pain status post fall EXAM: LEFT KNEE - 1-2 VIEW COMPARISON:  None available FINDINGS: Moderate joint space loss and spurring and chondrocalcinosis of the medial and lateral compartments. Mild joint space loss and spurring of the patellofemoral compartment. No fracture or dislocation. IMPRESSION: 1. No acute abnormality of the LEFT knee. 2. Moderate tricompartmental osteoarthrosis of the LEFT knee. Electronically Signed   By: Aliene Lloyd M.D.   On: 12/29/2024 08:30   DG Knee 1-2 Views Right Result Date: 12/29/2024 CLINICAL DATA:  LEFT shoulder and hip pain status post fall EXAM: RIGHT KNEE - 1-2 VIEW COMPARISON:  None available FINDINGS: RIGHT total knee prosthesis is well seated without periprosthetic fracture or lucency. No acute fracture or dislocation. Diffuse osteopenia. IMPRESSION: Uncomplicated RIGHT total knee prosthesis. No acute abnormality  of the RIGHT knee. Electronically Signed   By: Aliene Lloyd M.D.   On: 12/29/2024 08:29   DG Ankle 2 Views Right Result Date: 12/29/2024 CLINICAL DATA:  LEFT shoulder and hip pain status post fall EXAM: RIGHT ANKLE -  2 VIEW COMPARISON:  None available FINDINGS: Enthesopathic changes are seen at the insertion of the plantar fascia and Achilles tendon on the calcaneus. Mild degenerative spurring seen at the tip of the lateral malleolus. No acute fracture or dislocation. Visualized soft tissues are unremarkable. IMPRESSION: No acute radiographic abnormality of the RIGHT ankle. Electronically Signed   By: Aliene Lloyd M.D.   On: 12/29/2024 08:28   DG Chest 1 View Result Date: 12/29/2024 CLINICAL DATA:  LEFT shoulder and hip pain status post fall EXAM: CHEST  1 VIEW COMPARISON:  10/08/2023 FINDINGS: The heart is moderately enlarged which appears increased since the prior chest radiograph. No pulmonary vascular congestion. Focal opacity in the LEFT lung base could relate to pneumonia or atelectasis. Lungs are otherwise clear. Known LEFT proximal humerus fracture better visualized on dedicated shoulder radiographs. IMPRESSION: 1. Interval worsening of moderate cardiomegaly. 2. Focal opacity in the LEFT lung base may be due to atelectasis or pneumonia. 3. Known LEFT proximal humerus fracture better seen on dedicated shoulder radiographs. Electronically Signed   By: Aliene Lloyd M.D.   On: 12/29/2024 08:26   DG Shoulder Left Result Date: 12/29/2024 CLINICAL DATA:  LEFT shoulder and hip pain status post fall EXAM: LEFT SHOULDER - 2+ VIEW COMPARISON:  None available FINDINGS: Mildly impacted and displaced fracture of the LEFT proximal humerus. Mild elevation of the clavicle relative the acromion measuring 9 mm, suspicious for [facility] joint separation. Diffuse osteopenia. Calcifications adjacent to the greater tuberosity consistent with calcific rotator cuff tendinosis. IMPRESSION: 1. Mildly impacted and displaced LEFT proximal humerus fracture. 2. Elevated LEFT clavicle relative the acromion, suspicious for [facility] joint separation. Electronically Signed   By: Aliene Lloyd M.D.   On: 12/29/2024 08:24     The results of significant  diagnostics from this hospitalization (including imaging, microbiology, ancillary and laboratory) are listed below for reference.     Microbiology: Recent Results (from the past 240 hours)  Resp panel by RT-PCR (RSV, Flu A&B, Covid) Urine, Clean Catch     Status: None   Collection Time: 01/17/25  2:42 PM   Specimen: Urine, Clean Catch; Nasal Swab  Result Value Ref Range Status   SARS Coronavirus 2 by RT PCR NEGATIVE NEGATIVE Final   Influenza A by PCR NEGATIVE NEGATIVE Final   Influenza B by PCR NEGATIVE NEGATIVE Final    Comment: (NOTE) The Xpert Xpress SARS-CoV-2/FLU/RSV plus assay is intended as an aid in the diagnosis of influenza from Nasopharyngeal swab specimens and should not be used as a sole basis for treatment. Nasal washings and aspirates are unacceptable for Xpert Xpress SARS-CoV-2/FLU/RSV testing.  Fact Sheet for Patients: bloggercourse.com  Fact Sheet for Healthcare Providers: seriousbroker.it  This test is not yet approved or cleared by the [place]  FDA and has been authorized for detection and/or diagnosis of SARS-CoV-2 by FDA under an Emergency Use Authorization (EUA). This EUA will remain in effect (meaning this test can be used) for the duration of the COVID-19 declaration under Section 564(b)(1) of the Act, 21 U.S.C. section 360bbb-3(b)(1), unless the authorization is terminated or revoked.     Resp Syncytial Virus by PCR NEGATIVE NEGATIVE Final    Comment: (NOTE) Fact Sheet for Patients: bloggercourse.com  Fact Sheet for Healthcare  Providers: seriousbroker.it  This test is not yet approved or cleared by the [place]  FDA and has been authorized for detection and/or diagnosis of SARS-CoV-2 by FDA under an Emergency Use Authorization (EUA). This EUA will remain in effect (meaning this test can be used) for the duration of the COVID-19 declaration  under Section 564(b)(1) of the Act, 21 U.S.C. section 360bbb-3(b)(1), unless the authorization is terminated or revoked.  Performed at [facility], [address], [place], [place] [address]   Urine Culture     Status: None   Collection Time: 01/17/25  3:56 PM   Specimen: Urine, Random  Result Value Ref Range Status   Specimen Description URINE, RANDOM  Final   Special Requests NONE Reflexed from [id number]  Final   Culture   Final    NO GROWTH Performed at [facility], [address] [place], [place] [address]    Report Status 01/18/2025 FINAL  Final     Labs: BNP (last 3 results) No results for input(s): BNP in the last 8760 hours. Basic Metabolic Panel: Recent Labs  Lab 01/17/25 1542 01/17/25 1551 01/18/25 0508 01/19/25 0310  NA 138 140 138 138  K 4.5 4.2 4.1 4.4  CL 104 103 105 107  CO2 24  --  23 24  GLUCOSE 89 89 77 83  BUN 23 24* 20 20  CREATININE 0.96 1.10* 0.73 0.82  CALCIUM  9.3  --  9.1 8.9  MG 2.0  --  2.0 2.0  PHOS  --   --  3.0 3.1   Liver Function Tests: Recent Labs  Lab 01/17/25 1542 01/18/25 0508  AST 28 22  ALT <5 <5  ALKPHOS 210* 192*  BILITOT 1.2 1.0  PROT 6.4* 5.7*  ALBUMIN  3.5 3.2*   No results for input(s): LIPASE, AMYLASE in the last 168 hours. Recent Labs  Lab 01/18/25 0927  AMMONIA 20   CBC: Recent Labs  Lab 01/17/25 1542 01/17/25 1551 01/18/25 0508 01/19/25 0310  WBC 6.8  --  5.6 4.8  NEUTROABS 5.0  --   --   --   HGB 12.5 12.9 12.2 11.2*  HCT 39.6 38.0 39.0 35.3*  MCV 102.9*  --  103.4* 103.2*  PLT 294  --  236 245   Cardiac Enzymes: No results for input(s): CKTOTAL, CKMB, CKMBINDEX, TROPONINI in the last 168 hours. BNP: Invalid input(s): POCBNP CBG: Recent Labs  Lab 01/17/25 1619  GLUCAP 84   D-Dimer No results for input(s): DDIMER in the last 72 hours. Hgb A1c No results for input(s): HGBA1C in the last 72 hours. Lipid Profile No results for input(s): CHOL, HDL,  LDLCALC, TRIG, CHOLHDL, LDLDIRECT in the last 72 hours. Thyroid  function studies Recent Labs    01/18/25 0927  TSH 14.600*   Anemia work up Recent Labs    01/18/25 0927  VITAMINB12 536  FOLATE 13.3   Urinalysis    Component Value Date/Time   COLORURINE YELLOW 01/17/2025 1556   APPEARANCEUR HAZY (A) 01/17/2025 1556   LABSPEC 1.013 01/17/2025 1556   PHURINE 6.0 01/17/2025 1556   GLUCOSEU NEGATIVE 01/17/2025 1556   HGBUR SMALL (A) 01/17/2025 1556   BILIRUBINUR NEGATIVE 01/17/2025 1556   KETONESUR NEGATIVE 01/17/2025 1556   PROTEINUR 30 (A) 01/17/2025 1556   UROBILINOGEN 0.2 11/23/2011 1422   NITRITE NEGATIVE 01/17/2025 1556   LEUKOCYTESUR LARGE (A) 01/17/2025 1556   Sepsis Labs Recent Labs  Lab 01/17/25 1542 01/18/25 0508 01/19/25 0310  WBC 6.8 5.6 4.8  Microbiology Recent Results (from the past 240 hours)  Resp panel by RT-PCR (RSV, Flu A&B, Covid) Urine, Clean Catch     Status: None   Collection Time: 01/17/25  2:42 PM   Specimen: Urine, Clean Catch; Nasal Swab  Result Value Ref Range Status   SARS Coronavirus 2 by RT PCR NEGATIVE NEGATIVE Final   Influenza A by PCR NEGATIVE NEGATIVE Final   Influenza B by PCR NEGATIVE NEGATIVE Final    Comment: (NOTE) The Xpert Xpress SARS-CoV-2/FLU/RSV plus assay is intended as an aid in the diagnosis of influenza from Nasopharyngeal swab specimens and should not be used as a sole basis for treatment. Nasal washings and aspirates are unacceptable for Xpert Xpress SARS-CoV-2/FLU/RSV testing.  Fact Sheet for Patients: bloggercourse.com  Fact Sheet for Healthcare Providers: seriousbroker.it  This test is not yet approved or cleared by the [place]  FDA and has been authorized for detection and/or diagnosis of SARS-CoV-2 by FDA under an Emergency Use Authorization (EUA). This EUA will remain in effect (meaning this test can be used) for the duration of  the COVID-19 declaration under Section 564(b)(1) of the Act, 21 U.S.C. section 360bbb-3(b)(1), unless the authorization is terminated or revoked.     Resp Syncytial Virus by PCR NEGATIVE NEGATIVE Final    Comment: (NOTE) Fact Sheet for Patients: bloggercourse.com  Fact Sheet for Healthcare Providers: seriousbroker.it  This test is not yet approved or cleared by the [place]  FDA and has been authorized for detection and/or diagnosis of SARS-CoV-2 by FDA under an Emergency Use Authorization (EUA). This EUA will remain in effect (meaning this test can be used) for the duration of the COVID-19 declaration under Section 564(b)(1) of the Act, 21 U.S.C. section 360bbb-3(b)(1), unless the authorization is terminated or revoked.  Performed at [facility], [address], [place], [place] [address]   Urine Culture     Status: None   Collection Time: 01/17/25  3:56 PM   Specimen: Urine, Random  Result Value Ref Range Status   Specimen Description URINE, RANDOM  Final   Special Requests NONE Reflexed from [id number]  Final   Culture   Final    NO GROWTH Performed at [facility], [address] [place], [place] [address]    Report Status 01/18/2025 FINAL  Final     Time coordinating discharge:  I have spent 35 minutes face to face with the patient and on the ward discussing the patients care, assessment, plan and disposition with other care givers. >50% of the time was devoted counseling the patient about the risks and benefits of treatment/Discharge disposition and coordinating care.   SIGNED:   Burgess JAYSON Dare, MD  [facility] 01/19/2025, 12:43 PM   If 7PM-7AM, please contact night-coverage      [1]  Allergies Allergen Reactions   Alendronate Other (See Comments)     Unknown   Calcitonin Nausea And Vomiting   Gabapentin Other (See Comments)    Blurred vision   Hydrocodone -Acetaminophen  Other (See  Comments)    Dizziness    Latex Other (See Comments)     Unknown   Meperidine  Hcl Nausea And Vomiting   Oxycodone -Acetaminophen  Other (See Comments)    Unknown   Pravastatin Other (See Comments)    Unknown   Raloxifene Other (See Comments)     Unknown   Risedronate Other (See Comments)     Unknown   Tramadol  Hcl Other (See Comments)    Does not feel right  Darvocet [Propoxyphene N-Acetaminophen ] Nausea And Vomiting   Demerol  Hives   Etodolac Hives and Rash    Unknown to patient   Hydrocodone  Itching   Meprozine [Meperidine -Promethazine] Other (See Comments)    Reaction=tongue swelling   Penicillins Rash   Percocet [Oxycodone -Acetaminophen ] Nausea And Vomiting    Dizziness    Ultracet [Tramadol -Acetaminophen ] Other (See Comments)    headache
